# Patient Record
Sex: Male | Born: 1935 | Race: White | Hispanic: No | Marital: Married | Smoking: Former smoker
Health system: Southern US, Community
[De-identification: ages and names within clinical notes are randomized; demographics above are authoritative.]

## PROBLEM LIST (undated history)

## (undated) DIAGNOSIS — Z860100 Personal history of colon polyps, unspecified: Secondary | ICD-10-CM

## (undated) DIAGNOSIS — H9193 Unspecified hearing loss, bilateral: Secondary | ICD-10-CM

## (undated) DIAGNOSIS — F32A Depression, unspecified: Secondary | ICD-10-CM

## (undated) DIAGNOSIS — Z960 Presence of urogenital implants: Secondary | ICD-10-CM

## (undated) DIAGNOSIS — K5909 Other constipation: Secondary | ICD-10-CM

## (undated) DIAGNOSIS — K759 Inflammatory liver disease, unspecified: Secondary | ICD-10-CM

## (undated) DIAGNOSIS — I639 Cerebral infarction, unspecified: Secondary | ICD-10-CM

## (undated) DIAGNOSIS — I10 Essential (primary) hypertension: Secondary | ICD-10-CM

## (undated) DIAGNOSIS — K219 Gastro-esophageal reflux disease without esophagitis: Secondary | ICD-10-CM

## (undated) DIAGNOSIS — I609 Nontraumatic subarachnoid hemorrhage, unspecified: Secondary | ICD-10-CM

## (undated) DIAGNOSIS — R194 Change in bowel habit: Secondary | ICD-10-CM

## (undated) DIAGNOSIS — R569 Unspecified convulsions: Secondary | ICD-10-CM

## (undated) DIAGNOSIS — I350 Nonrheumatic aortic (valve) stenosis: Secondary | ICD-10-CM

## (undated) DIAGNOSIS — I509 Heart failure, unspecified: Secondary | ICD-10-CM

## (undated) DIAGNOSIS — N400 Enlarged prostate without lower urinary tract symptoms: Secondary | ICD-10-CM

## (undated) DIAGNOSIS — I2510 Atherosclerotic heart disease of native coronary artery without angina pectoris: Secondary | ICD-10-CM

## (undated) DIAGNOSIS — E782 Mixed hyperlipidemia: Secondary | ICD-10-CM

## (undated) DIAGNOSIS — E785 Hyperlipidemia, unspecified: Secondary | ICD-10-CM

## (undated) DIAGNOSIS — J449 Chronic obstructive pulmonary disease, unspecified: Secondary | ICD-10-CM

## (undated) DIAGNOSIS — F329 Major depressive disorder, single episode, unspecified: Secondary | ICD-10-CM

## (undated) DIAGNOSIS — I739 Peripheral vascular disease, unspecified: Secondary | ICD-10-CM

## (undated) DIAGNOSIS — M353 Polymyalgia rheumatica: Secondary | ICD-10-CM

## (undated) HISTORY — DX: Nontraumatic subarachnoid hemorrhage, unspecified: I60.9

## (undated) HISTORY — DX: Atherosclerotic heart disease of native coronary artery without angina pectoris: I25.10

## (undated) HISTORY — DX: Hyperlipidemia, unspecified: E78.5

## (undated) HISTORY — DX: Peripheral vascular disease, unspecified: I73.9

## (undated) HISTORY — PX: CHOLECYSTECTOMY: SHX55

## (undated) HISTORY — DX: Heart failure, unspecified: I50.9

## (undated) HISTORY — DX: Nonrheumatic aortic (valve) stenosis: I35.0

## (undated) HISTORY — DX: Polymyalgia rheumatica: M35.3

## (undated) HISTORY — DX: Personal history of colonic polyps: Z86.010

## (undated) HISTORY — DX: Mixed hyperlipidemia: E78.2

## (undated) HISTORY — DX: Essential (primary) hypertension: I10

## (undated) HISTORY — PX: CATARACT EXTRACTION, BILATERAL: SHX1313

## (undated) HISTORY — PX: TONSILLECTOMY: SUR1361

## (undated) HISTORY — DX: Other constipation: K59.09

## (undated) HISTORY — DX: Personal history of colon polyps, unspecified: Z86.0100

## (undated) HISTORY — DX: Change in bowel habit: R19.4

---

## 2006-06-23 ENCOUNTER — Ambulatory Visit: Payer: Self-pay | Admitting: Cardiology

## 2006-06-28 ENCOUNTER — Ambulatory Visit: Payer: Self-pay | Admitting: Cardiovascular Disease

## 2006-06-28 ENCOUNTER — Inpatient Hospital Stay (HOSPITAL_BASED_OUTPATIENT_CLINIC_OR_DEPARTMENT_OTHER): Admission: RE | Admit: 2006-06-28 | Discharge: 2006-06-28 | Payer: Self-pay | Admitting: Cardiovascular Disease

## 2006-09-07 DIAGNOSIS — K5909 Other constipation: Secondary | ICD-10-CM

## 2006-09-07 HISTORY — DX: Other constipation: K59.09

## 2007-07-04 HISTORY — PX: COLONOSCOPY: SHX174

## 2008-01-03 ENCOUNTER — Ambulatory Visit (HOSPITAL_COMMUNITY): Admission: RE | Admit: 2008-01-03 | Discharge: 2008-01-03 | Payer: Self-pay | Admitting: Pediatrics

## 2008-02-27 ENCOUNTER — Ambulatory Visit (HOSPITAL_COMMUNITY): Admission: RE | Admit: 2008-02-27 | Discharge: 2008-02-27 | Payer: Self-pay | Admitting: Urology

## 2008-03-06 ENCOUNTER — Ambulatory Visit: Admission: RE | Admit: 2008-03-06 | Discharge: 2008-03-06 | Payer: Self-pay | Admitting: Pediatrics

## 2008-03-12 ENCOUNTER — Ambulatory Visit (HOSPITAL_COMMUNITY): Admission: RE | Admit: 2008-03-12 | Discharge: 2008-03-12 | Payer: Self-pay | Admitting: Urology

## 2008-04-23 ENCOUNTER — Ambulatory Visit (HOSPITAL_COMMUNITY): Admission: RE | Admit: 2008-04-23 | Discharge: 2008-04-23 | Payer: Self-pay | Admitting: Urology

## 2008-05-15 ENCOUNTER — Ambulatory Visit: Payer: Self-pay | Admitting: Cardiology

## 2008-05-15 ENCOUNTER — Encounter (INDEPENDENT_AMBULATORY_CARE_PROVIDER_SITE_OTHER): Payer: Self-pay | Admitting: Pediatrics

## 2008-05-15 ENCOUNTER — Ambulatory Visit (HOSPITAL_COMMUNITY): Admission: RE | Admit: 2008-05-15 | Discharge: 2008-05-15 | Payer: Self-pay | Admitting: Pediatrics

## 2010-04-28 ENCOUNTER — Ambulatory Visit (HOSPITAL_COMMUNITY): Admission: RE | Admit: 2010-04-28 | Discharge: 2010-04-28 | Payer: Self-pay | Admitting: Family Medicine

## 2011-01-20 NOTE — Procedures (Signed)
NAME:  Jimmy Franklin, Jimmy Franklin NO.:  [id number]   MEDICAL RECORD NO.:  [id number]          PATIENT TYPE:  OUT   LOCATION:  SLEE                          FACILITY:  [facility]   PHYSICIAN:  Kofi A. Gerilyn Pilgrim, M.D. DATE OF BIRTH:  08-31-1934   DATE OF PROCEDURE:  DATE OF DISCHARGE:  03/06/2008                             SLEEP DISORDER REPORT   NOCTURNAL POLYSOMNOGRAPHY REPORT   INDICATION:  This 77 year old male who presents with snoring and  hypersomnia.  He has been evaluated for obstructive sleep apnea  syndrome.   MEDICATIONS:  1. MiraLax.  2. Lisinopril.  3. Aspirin.  4. Colace.  5. Bisoprolol.  6. Omeprazole.  7. Percocet.  8. Senna.   Epworth sleepiness scale 6.  BMI 34.   SLEEP STAGE SUMMARY:  The total recording time is 421 minutes.  Sleep  efficiency 68%.  Sleep latency 13 minutes.  REM latency 142 minutes.  Stage N1 21%, N2 44%, N3 22%, and REM sleep 13%.   RESPIRATORY SUMMARY:  Baseline oxygen saturation is 95%, the lowest  saturation is 87%, and AHI 10.   LIMB MOVEMENT SUMMARY:  PLM index is 18.   ELECTROCARDIOGRAM SUMMARY:  Average heart rate 78 with no significant  dysrhythmias observed.   IMPRESSION:  1. Mild obstructive sleep apnea syndrome.  2. Moderate periodic limb movement disorder sleep.   RECOMMENDATIONS:  Although, he did not meet the criteria for split night  study, he may benefit from a trial of auto titration unit at home to  treat his mild apnea.   Thanks for this referral.      Kofi A. Gerilyn Pilgrim, M.D.  Electronically Signed     KAD/MEDQ  D:  03/12/2008  T:  03/12/2008  Job:  [id number]

## 2011-01-20 NOTE — Op Note (Signed)
NAME:  Jimmy, Franklin NO.:  [id number]   MEDICAL RECORD NO.:  [id number]          PATIENT TYPE:  AMB   LOCATION:  DAY                           FACILITY:  [facility]   PHYSICIAN:  Dennie Maizes, M.D.   DATE OF BIRTH:  06/21/1934   DATE OF PROCEDURE:  03/12/2008  DATE OF DISCHARGE:                               OPERATIVE REPORT   PREOPERATIVE DIAGNOSES:  Hematuria, irregular left ureter, bladder  diverticulum.   POSTOPERATIVE DIAGNOSES:  Hematuria, irregular left ureter, bladder  diverticulum, and benign prostatic hypertrophy with bladder neck  obstruction.   OPERATIVE PROCEDURE:  Cystoscopy and attempted left retrograde  pyelogram.   ANESTHESIA:  General.   SURGEON:  Dennie Maizes, MD   COMPLICATIONS:  None.   ESTIMATED BLOOD LOSS:  Minimal.   DRAINS:  A 16-French Foley catheter in the bladder.   INDICATIONS FOR PROCEDURE:  This 77 year old male had intermittent mild  hematuria.  His urine cytology revealed atypical urothelial cells.  CT  scan of the abdomen and pelvis revealed no evidence of urolithiasis.  There was evidence of any solid renal mass.  The left ureter was not  seen well and found to be regular.  There was large bladder  diverticulum.  The patient was taken to the operating room today for  cystoscopy, possible bladder biopsy, and left retrograde pyelogram.   DESCRIPTION OF PROCEDURE:  General anesthesia was induced and the  patient was placed on the OR table in the dorsolithotomy position.  The  lower abdomen and genitalia were prepped and draped in a sterile  fashion.  Cystoscopy was attempted with the 25-French scope.  The  urethra was normal.  The prostate was large with bilobar enlargement  with bladder neck obstruction.  It was difficult to insert the scope  into the bladder.  The bladder neck was found to be high riding.   A 15-French flexible cystoscope was then introduced into the bladder.  Examination revealed a heavily  trabeculated bladder with a diverticulum  arising in the left lateral wall of the bladder just medial to the  ureteral orifice.  The right ureteral orifice was seen as well.  The  left ureteral orifice can clearly be seen with some difficulty as it was  located in the post-prostatic pouch.  Due to heavy trabeculation, it was  difficult to visualize the ureteral orifices.  I tried to pass a 5-  [place] whistle-tip catheter through the flexible ureteroscope and  cannulated the ureteral orifice which was unsuccessful.  Due to the  location of the ureteral orifice, it was difficult to insert the  catheter.  The left retrograde pyelogram could not be done.  The  cystoscope was then removed.  The rigid cystoscope was then inserted  into the bladder with some difficulty.  Examination of bladder was  repeated and there was evidence of any foreign body tumor in the  bladder.  Again trabeculations of the bladder as well as the bladder  diverticula were noted.  Instruments were then removed.  A 16-French  coude catheter was then inserted into the bladder.  The patient was then  transferred to the PACU in a satisfactory condition.   The left ureteral orifice was not visualized well in the CT urogram.  I  plan to repeat a IVP to visualize the collecting system on both sides.  The urine cytology will also be repeated later.      Dennie Maizes, M.D.  Electronically Signed     SK/MEDQ  D:  03/12/2008  T:  03/12/2008  Job:  [id number]   cc:   Francoise Schaumann. Milford Cage DO, FAAP  Fax: [phone]

## 2011-01-20 NOTE — H&P (Signed)
NAME:  Jimmy Franklin, Jimmy Franklin NO.:  [id number]   MEDICAL RECORD NO.:  [id number]          PATIENT TYPE:  AMB   LOCATION:  DAY                           FACILITY:  [facility]   PHYSICIAN:  Dennie Maizes, M.D.   DATE OF BIRTH:  [birth year]   DATE OF ADMISSION:  03/12/2008  DATE OF DISCHARGE:  LH                              HISTORY & PHYSICAL   CHIEF COMPLAINT:  Hematuria, bladder diverticulum.   HISTORY OF PRESENT ILLNESS:  This 77 year old male was referred to me by  Ms. Satira Anis, FNP.  He had intermittent gross hematuria for a few  days.  There is no past history of urolithiasis, urinary tract  infections or gross hematuria.  The patient denied having any voiding  difficulty.  He had urinary frequency times 3 to 4 and nocturia 1 to 2.  He has some urinary hesitancy and urgency at present.  He denied having  abdominal or flank pain.   PAST MEDICAL HISTORY:  1. COPD.  2. Hypertension.  3. GERD.  4. Elevated cholesterol.  5. Status post cholecystectomy.   MEDICATION:  1. Vitamin D 1000 international units p.o. q. daily.  2. Bayer aspirin 1 p.o. q. daily which has been discontinued for the      surgery.  3. Omeprazole 40 mg p.o. q. daily.  4. Tricor 140 mg 1 p.o. q. daily.  5. Lisinopril 20 mg 1 p.o. q. daily.  6. Simvastatin 10 mg 1 p.o. q. daily.  7. Advair 250/50 Diskus.   ALLERGIES:  None.   FAMILY HISTORY:  Positive for colon cancer, lung cancer and heart  disease.   PHYSICAL EXAMINATION:  Height 6 feet.  Weight 254 pounds.  HEAD, EYES, EARS, NOSE and THROAT:  Normal.  NECK:  No masses.  LUNGS:  Clear to auscultation.  HEART:  Regular rate and rhythm, no murmurs.  ABDOMEN:  Soft, no palpable flank mass or CVA tenderness.  Bladder not  palpable.  Penis and testes are normal.  RECTAL EXAMINATION:  40 grams and has benign prostate.   Evaluation of hematuria had been done as an outpatient.  Renal function  is normal.  BUN 18.  Creatinine 1.04.  PSA  0.52 ng/mL.  Urine cytology  revealed atypical urothelial cells.  The patient has undergone  evaluation for CT scan of abdomen and pelvis with and without contrast.  There is no evidence of any urinary calculi.  There is a small cyst in  the mid pole of right kidney.  Peripelvic cysts are noted bilaterally.  There is no evidence of any solid renal mass.  Prior cholecystectomy has  been noted.  The left ureter was not filled adequately.  This needs  further evaluation with retrograde pyelogram.  The patient had a 7 x 6  cm sized bladder diverticula posterior to the bladder.   IMPRESSION:  Hematuria, left ureteral irregularity and abnormal urine  cytology, bladder diverticulum.   PLAN:  Plan on cystoscopy, possible bladder biopsy and left retrograde  pyelogram in short-stay center.  I have discussed with the patient  regarding the diagnosis, operative details, alternate treatments,  the  outcome, possible risks and complications and he has agreed for the  procedure to be done.      Dennie Maizes, M.D.  Electronically Signed     SK/MEDQ  D:  03/12/2008  T:  03/12/2008  Job:  [id number]   cc:   Jerolyn Shin, FNP   Helm, Dr.  [facility]

## 2011-01-23 NOTE — Cardiovascular Report (Signed)
NAME:  Jimmy Franklin, Jimmy Franklin NO.:  [id number]   MEDICAL RECORD NO.:  [id number]          PATIENT TYPE:  OIB   LOCATION:  1962                         FACILITY:  [facility]   PHYSICIAN:  Veverly Fells. Excell Seltzer, MD  DATE OF BIRTH:  12/15/1933   DATE OF PROCEDURE:  06/28/2006  DATE OF DISCHARGE:                              CARDIAC CATHETERIZATION   PROCEDURES PERFORMED:  1. Right heart catheterization.  2. Left heart catheterization.  3. Selective coronary angiography.  4. Left ventricular angiography.   INDICATIONS FOR PROCEDURE:  Mr. Ra is a very pleasant 77 year old male  from [place] who presents with shortness of breath and past history of  nonobstructive coronary artery disease.  It has been several years since his  last heart catheterization.  He has multiple cardiovascular risk factors.  He has had an echocardiogram performed that showed basal septal hypertrophy  and an LV outflow tract gradient.  He was referred for right and left heart  catheterization with Brockenbrough maneuvers to evaluate him for coronary  artery disease as well as significant LV outflow obstruction.   DESCRIPTION OF PROCEDURE:  All details, risks, and indications of the  procedure were explained in detail to the patient.  Informed consent was  obtained.  The right groin was prepped, draped, and anesthetized with 1%  lidocaine under normal sterile conditions.  Using the modified Seldinger  technique, a 7-French sheath was placed in the right femoral vein and a 4-  French sheath was placed in the right femoral artery.  Coronary angiography  was performed first.  A 4-French JL4 catheter was used to image the left  coronary artery.  A 4-French JR4 catheter was used to image the right  coronary artery.  Multiple angiographic views of both vessels were  performed.  Following coronary angiography, a right heart catheterization  was performed using a Swan-Ganz catheter.  Pressures were recorded  throughout the right heart.  Oxygen saturations were drawn from the femoral  artery and the pulmonary artery.  Following the right heart catheterization,  a 4-French angled pigtail catheter was inserted into the left ventricle.  Left ventricular pressures were recorded.  A 30-degree RAO left  ventriculogram was performed.  Following left ventriculography, a pullback  within the left ventricle was performed to evaluate for any intracavitary LV  gradient.  Following this, PVCs were induced with a Swan-Ganz catheter in  the right ventricle and the left ventricular pressure was simultaneously  recorded to evaluate for the Brockenbrough phenomenon.  Following these  maneuvers, a pullback across the aortic valve was performed.  PVCs were  again induced and aortic pressures were recorded to once again evaluate for  the Brockenbrough phenomenon.   FINDINGS:  Right atrial pressure:  A wave 8, V wave 5, mean of 4.  RV  pressure:  26/1 with an end-diastolic pressure of 6.  PA pressure 20/5 with  a mean pressure of 13.  Pulmonary capillary wedge pressure:  A wave 14, V  wave 12, mean of 10.  Left ventricular pressure:  108/3 with an end-  diastolic pressure of 10.  Aortic pressure:  112/67 with a mean of 86.   Oxygen saturations 63% in the pulmonary artery.  The aorta was 89%.  Thick  cardiac output was 5.7 liters per minute.  Cardiac index 2.4 liters per  minute per m/sq.   Coronary angiography:  Left main stem has 20% mid left main stem disease  that is nonobstructive.  The left main stem bifurcates into the LAD and left  circumflex.  The LAD is a medium-caliber vessel that courses down to the  left ventricular apex.  The proximal LAD has mild calcium and is  angiographically normal.  The proximal LAD gives off a first diagonal branch  that is large diameter.  The first diagonal is angiographically normal.  The  mid LAD beyond the diagonal branch has 40% stenosis that appears  nonobstructive.   The remainder of the mid and distal LAD are  angiographically normal.  The left circumflex is a medium-caliber vessel.  It gives off a first obtuse marginal branch that is angiographically normal.  There is a second OM branch that supplies a large portion of the  inferolateral wall and is also angiographically normal.  The true left  circumflex is small diameter and courses down the AV groove.  The left  circumflex system is angiographically normal.   The right coronary artery is dominant.  Distally, it bifurcates into the PDA  and posterior AV segment.  The right coronary artery is angiographically  normal throughout its proximal mid and distal portions.  The PDA has serial  50% disease throughout its course.  The posterior AV segment and  posterolateral branch have no significant disease.   The left ventriculogram demonstrates hyperdynamic left ventricular function  with mid cavitary obliteration.  The left ventricular ejection fraction is  estimated at 75%.   Brockenbrough maneuvers were done with pressure recordings in the left  ventricle as well as the aorta and there was no significant Brockenbrough  phenomenon.   ASSESSMENT:  1. Nonobstructive coronary artery disease.  2. Hyperdynamic left ventricular function.  3. Normal right heart pressures.  4. No intracavitary left ventricular gradient.  5. Negative Brockenbrough maneuvers.   With the patient's normal hemodynamics and nonobstructive coronary artery  disease, recommend evaluation of his shortness of breath from a pulmonary  standpoint.  His arterial saturation was 89%.  Will set him up for PFTs and  a pulmonary followup in [place].      Veverly Fells. Excell Seltzer, MD  Electronically Signed     MDC/MEDQ  D:  06/28/2006  T:  06/28/2006  Job:  [id number]   cc:   Learta Codding, MD,FACC

## 2011-01-23 NOTE — Assessment & Plan Note (Signed)
[facility]                            [facility] NOTE   NAME:Rothlisberger, BRYAM TABORDA                    MRN:          [id number]  DATE:06/23/2006                            DOB:          1933-10-06    REFERRING PHYSICIAN:  Doreen Beam, M.D.   HISTORY OF PRESENT ILLNESS:  The patient is a 77 year old male with a  history of dyspnea x6 months.  The patient reports both dyspnea with rest  and activity.  He also reports increased fatigue.  An echocardiogram study  was done 2 weeks ago in Dr. Sherril Croon office, which demonstrated hyperdynamic LV  contraction with small LV outflow gradient measured at 2.5 meter per second.  There was also aortic sclerosis but no definite aortic stenosis.  The  patient has now been referred for further evaluation.  He also reports some  neck and shoulder pain on activity however, he reports pain down the left  arm.  He saw recently a neurologist for left arm pain, which was felt to be  secondary to neuropathy.  He also reports chest pressure upon exertion.  The  patient reports a lot of snoring and symptoms consistent with sleep apnea.  He denies any orthopnea, PND.  He denies any palpitations.  He does report  numbness in the left arm and as outlined above had an MRI done for further  evaluation.  In general, he reports fatigue and lack of energy.  He also has  some hearing loss.  He feels he is under a significant amount of stress.   MEDICATIONS:  1. Gabapentin 100 mg, 2 tablets q.h.s.  2. Lyrica 50 mg daily.  3. Alprazolam 0.2 daily.  4. Amlodipine 5 mg daily.   PAST MEDICAL HISTORY:  Cardiac catheterization 8 years ago, which was within  normal limits.  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  He has a son who  died of lung cancer.   FAMILY HISTORY:  Otherwise negative for coronary artery disease.  The  patient denies any allergies.  He does report a history of tobacco use and  smoked 3 packs a day but quit 41  years ago.   REVIEW OF SYSTEMS:  No fever or chills.  No headache.  Reports no blurred or  double vision.  No palpitations or syncope.  No nausea or sweating.  No  cough.  No PND, orthopnea but dyspnea at rest and on exertion.  No heartburn  or fluctuance.  No dysuria or frequency.  No joint stiffness.  No skin  lesions.  Positive for weakness and left arm numbness.  No easy bruising.  Remainder of review of systems within normal limits.   PHYSICAL EXAMINATION:  VITAL SIGNS:  Blood pressure is 148/94, heart rate  103 beats per minute.  GENERAL APPEARANCE:  Within normal limits.  HEENT:  Conjunctiva - eyelids within normal limits.  ENT - oropharynx and  nasal mucosa within normal limits.  NECK:  Supple.  There is no definite JVD.  Normal carotid upstroke.  No  carotid bruits.  RESPIRATORY:  Normal chest exam with no wheezing or  crackles.  HEART:  Normal S1 and S2.  No murmurs, rubs or gallops.  BREAST EXAM:  Not performed.  ABDOMINAL EXAM:  Essentially within normal limits with no rebound, guarding  and good bowel sounds.  LYMPHATICS:  Not performed.  GU AND RECTAL EXAM:  Not performed.  MUSCULOSKELETAL:  Gait and station within normal limits.  Range of motion is  within normal limits.  No skin lesions.  NEUROLOGICAL:  The patient is grossly intact with no focal findings.   A 12-lead electrocardiogram normal sinus rhythm with no acute ischemic  changes.   Echocardiographic study - Technically difficult with hyperdynamic LV  contractions, small outflow gradient sigmoid septum and aortic sclerosis.   PROBLEM LIST:  1. Dyspnea.  2. Chest pain.  3. Rule out hypertensive heart disease.  4. Rule out obstructive sleep apnea.  5. Left ventricular hypertrophy with sigmoid septum and small outflow      gradient.   PLAN:  1. The patient will need to be ruled out for coronary artery disease with      possible dyspnea as an __________  reports substernal chest pain.  He      does have  known risk factors.  2. If cardiac workup is within normal limits, further evaluation with PFTs      may be indicated.  The patient also appears to have symptoms consistent      with sleep apnea and apnea link monitor will be ordered.  3. Suspect the patient may have diastolic dysfunction, hypertensive heart      disease based on his electrocardiogram and abnormal blood pressure      readings.  This also would fit with the diagnosis of sleep apnea.  This      will need to be monitored in the future.  I started the patient on      atenolol 25 mg p.o. daily and aspirin 1 tablet p.o. daily.  He may need      additional medications to be added to his current antihypertensive      regimen.  4. I am not sure that the patient could not have a dynamic outflow      gradient and at the time of his catheterization, which will be a left      and right heart catheterization, will ask the operator to perform a      Brockenbrough maneuver.  5. The patient will be scheduled for left and right heart catheterization      with Brockenbrough maneuver to be done in the [facility] lab.       Learta Codding, MD,FACC     GED/MedQ  DD:  06/28/2006  DT:  06/28/2006  Job #:  [id number]   cc:   Doreen Beam

## 2011-03-21 DIAGNOSIS — R194 Change in bowel habit: Secondary | ICD-10-CM

## 2011-03-21 HISTORY — DX: Change in bowel habit: R19.4

## 2011-04-07 ENCOUNTER — Encounter (INDEPENDENT_AMBULATORY_CARE_PROVIDER_SITE_OTHER): Payer: Self-pay

## 2011-04-30 ENCOUNTER — Ambulatory Visit (INDEPENDENT_AMBULATORY_CARE_PROVIDER_SITE_OTHER): Payer: Self-pay | Admitting: Internal Medicine

## 2011-05-19 ENCOUNTER — Ambulatory Visit (INDEPENDENT_AMBULATORY_CARE_PROVIDER_SITE_OTHER): Payer: Medicare Other | Admitting: Urology

## 2011-05-19 DIAGNOSIS — R339 Retention of urine, unspecified: Secondary | ICD-10-CM

## 2011-05-19 DIAGNOSIS — N401 Enlarged prostate with lower urinary tract symptoms: Secondary | ICD-10-CM

## 2011-06-04 LAB — BASIC METABOLIC PANEL
CO2: 24
Chloride: 110
GFR calc non Af Amer: >60
Glucose, Bld: 133 — ABNORMAL HIGH
Potassium: 3.6

## 2011-06-04 LAB — CBC
HCT: 40.4
Hemoglobin: 13.4
MCHC: 33.1
MCV: 80.1

## 2011-06-22 ENCOUNTER — Encounter (HOSPITAL_COMMUNITY): Payer: Medicare Other

## 2011-06-22 ENCOUNTER — Other Ambulatory Visit: Payer: Self-pay | Admitting: Urology

## 2011-06-22 ENCOUNTER — Other Ambulatory Visit: Payer: Self-pay | Admitting: Anesthesiology

## 2011-06-22 LAB — BASIC METABOLIC PANEL
BUN: 15 mg/dL (ref 6–23)
Calcium: 9.4 mg/dL (ref 8.4–10.5)
Creatinine, Ser: 0.95 mg/dL (ref 0.50–1.35)
GFR calc Af Amer: >90 mL/min (ref >90)
GFR calc non Af Amer: 79 mL/min — ABNORMAL LOW (ref >90)
Glucose, Bld: 99 mg/dL (ref 70–99)
Potassium: 4.5 mEq/L (ref 3.5–5.1)
Sodium: 138 mEq/L (ref 135–145)

## 2011-06-22 LAB — CBC
HCT: 41.5 % (ref 39.0–52.0)
MCHC: 33.3 g/dL (ref 30.0–36.0)
MCV: 78.2 fL (ref 78.0–100.0)
Platelets: 157 10*3/uL (ref 150–400)
RDW: 15.9 % — ABNORMAL HIGH (ref 11.5–15.5)
WBC: 7.8 10*3/uL (ref 4.0–10.5)

## 2011-06-22 LAB — SURGICAL PCR SCREEN: MRSA, PCR: NEGATIVE

## 2011-06-23 ENCOUNTER — Ambulatory Visit (INDEPENDENT_AMBULATORY_CARE_PROVIDER_SITE_OTHER): Payer: Medicare Other | Admitting: Urology

## 2011-06-23 DIAGNOSIS — N401 Enlarged prostate with lower urinary tract symptoms: Secondary | ICD-10-CM

## 2011-06-23 DIAGNOSIS — R339 Retention of urine, unspecified: Secondary | ICD-10-CM

## 2011-06-24 ENCOUNTER — Ambulatory Visit (HOSPITAL_COMMUNITY)
Admission: RE | Admit: 2011-06-24 | Discharge: 2011-06-25 | Disposition: A | Discharge status: Home / Self Care | Payer: Medicare Other | Source: Ambulatory Visit | Attending: Urology | Admitting: Urology

## 2011-06-24 DIAGNOSIS — Z8673 Personal history of transient ischemic attack (TIA), and cerebral infarction without residual deficits: Secondary | ICD-10-CM | POA: Insufficient documentation

## 2011-06-24 DIAGNOSIS — R339 Retention of urine, unspecified: Secondary | ICD-10-CM | POA: Insufficient documentation

## 2011-06-24 DIAGNOSIS — I10 Essential (primary) hypertension: Secondary | ICD-10-CM | POA: Insufficient documentation

## 2011-06-24 DIAGNOSIS — N138 Other obstructive and reflux uropathy: Principal | ICD-10-CM | POA: Insufficient documentation

## 2011-06-24 DIAGNOSIS — N3289 Other specified disorders of bladder: Secondary | ICD-10-CM | POA: Insufficient documentation

## 2011-06-24 DIAGNOSIS — Z01812 Encounter for preprocedural laboratory examination: Secondary | ICD-10-CM | POA: Insufficient documentation

## 2011-06-24 DIAGNOSIS — E7800 Pure hypercholesterolemia, unspecified: Secondary | ICD-10-CM | POA: Insufficient documentation

## 2011-06-24 DIAGNOSIS — N323 Diverticulum of bladder: Secondary | ICD-10-CM | POA: Insufficient documentation

## 2011-06-24 DIAGNOSIS — K219 Gastro-esophageal reflux disease without esophagitis: Secondary | ICD-10-CM | POA: Insufficient documentation

## 2011-06-24 DIAGNOSIS — Z7902 Long term (current) use of antithrombotics/antiplatelets: Secondary | ICD-10-CM | POA: Insufficient documentation

## 2011-06-24 DIAGNOSIS — Z79899 Other long term (current) drug therapy: Secondary | ICD-10-CM | POA: Insufficient documentation

## 2011-06-24 DIAGNOSIS — N401 Enlarged prostate with lower urinary tract symptoms: Principal | ICD-10-CM | POA: Insufficient documentation

## 2011-06-30 NOTE — Op Note (Signed)
NAME:  Jimmy Franklin, HARKLESS NO.:  [id number]  MEDICAL RECORD NO.:  [id number]  LOCATION:  1427                         FACILITY:  [facility]  PHYSICIAN:  Bertram Millard. Adelei Scobey, M.D.DATE OF BIRTH:  [birth year]  DATE OF PROCEDURE:  06/24/2011 DATE OF DISCHARGE:                              OPERATIVE REPORT   PREOPERATIVE DIAGNOSIS:  Benign prostatic hypertrophy with urinary retention.  POSTOPERATIVE DIAGNOSIS:  Benign prostatic hypertrophy with urinary retention.  PRINCIPAL PROCEDURE:  Holmium laser ablation of prostate.  SURGEON:  Bertram Millard. Elynore Dolinski, MD  ANESTHESIA:  General with LMA.  COMPLICATIONS:  None.  BRIEF HISTORY:  Jimmy Franklin is a 77 year old male who we first saw in September in [place].  The patient had a history of urinary retention with BPH.  The patient had a stroke, and was hospitalized at [facility].  Because of urinary retention, he was seen by Dr. Gaynelle Arabian in the Department of Urology at [facility], and had evaluation for this including urodynamics.  This revealed the patient to have urinary obstruction, with a maximal flow rate of 6 cc/sec.  He had a relatively small capacity.  There were diverticula present within the bladder.  The patient presented to me after he failed several voiding trials.  He has had a catheter since his hospitalization at [facility], with him not being able to void with proper trials.  Because the patient is on Plavix, I have recommended, that if he had to have surgery for his BPH with retention, that we consider holmium laser rather then conventional TURP because of the significant bleeding with the TURP. The patient's neurologist state that he cannot come off his Plavix.  At this point, he is scheduled for laser TURP of the prostate.  He is aware of risks and complications of the procedure, which include but are not limited to bleeding, need for transfusion, bladder neck contracture, needing to  put the catheter back in because of inadequate voiding, infection, among others.  He understands these and desires to proceed.  DESCRIPTION OF PROCEDURE:  The patient was identified in the holding area.  Preoperative IV antibiotics were started.  He was taken to the operating room where general anesthetic was administered with LMA.  He was placed in the dorsal lithotomy position.  Genitalia and perineum were prepped and draped.  Proper time-out was then performed.  The procedure then commenced.  A 22-French panendoscope was advanced through his urethra.  His prostatic urethra was hyperemic from long-term Foley catheter placement.  His prostate was obstructed with bilobar hypertrophy.  His bladder was entered and inspected circumferentially. There were mild-to-moderate trabeculations.  Several diverticula were noted, the largest of these was in the left posterior lateral bladder. This was entered.  I probably held close to 200 cc.  I inspected it circumferentially and there were no urothelial abnormalities.  Following complete inspection of the bladder, I placed a 28-French resectoscope sheath.  I then placed the laser bridge.  A 550 side-firing single use laser fiber was placed.  The continuous irrigation/saline setup was used.  I then started the resection at the 12 o'clock position, from the bladder neck down to the distal prostatic urethra.  The right prostatic lobe was then taken down, with the direction of laser ablation being from the bladder neck to the distal prostatic urethra.  Similar resection/ablation was performed on the left side, again from the bladder neck down to the distal prostatic urethra.  The median lobe was then carefully laser coagulated, from the bladder neck to the verumontanum area.  There were 1 or 2 persistent bleeders posteriorly just to the left of the midline approximately 1 cm cephalad to the verumontanum.  I tried to fire the laser sideways in this area,  to avoid transmural damage to the rectum.  Following lengthy laser coagulation of the entire circumferential prostatic urethra, the prostatic urethra was patent, without evidence of lateral lobe encroachment.  At this point, total energy of 176.54 kilojoules had been administered.  I incised the bladder neck in the midline, and then at this point removed the scope. There was an adequate urinary stream with the bladder full and the scope removed.  I then placed a 24-French coude tip hematuria 3-way irrigation catheter.  This was hooked to CBI with saline after 40 cc of water had been placed in the balloon and gentle traction was placed.  At this point, the procedure terminated.  There were no specimens.  The patient was awakened and then taken to PACU in stable condition.     Bertram Millard. Oryn Casanova, M.D.     SMD/MEDQ  D:  06/24/2011  T:  06/24/2011  Job:  [id number]  cc:   Francoise Schaumann. Raynelle Highland Fax: [phone]  Emmie Niemann, MD Department of Neurology [facility]  Electronically Signed by Marcine Matar M.D. on 06/30/2011 06:01:07 PM

## 2011-07-14 ENCOUNTER — Ambulatory Visit (INDEPENDENT_AMBULATORY_CARE_PROVIDER_SITE_OTHER): Payer: Medicare Other | Admitting: Urology

## 2011-07-14 DIAGNOSIS — N401 Enlarged prostate with lower urinary tract symptoms: Secondary | ICD-10-CM

## 2011-07-14 DIAGNOSIS — R339 Retention of urine, unspecified: Secondary | ICD-10-CM

## 2011-07-28 ENCOUNTER — Ambulatory Visit: Payer: Medicare Other | Admitting: Urology

## 2011-08-18 ENCOUNTER — Ambulatory Visit (INDEPENDENT_AMBULATORY_CARE_PROVIDER_SITE_OTHER): Payer: Medicare Other | Admitting: Urology

## 2011-08-18 DIAGNOSIS — N138 Other obstructive and reflux uropathy: Secondary | ICD-10-CM

## 2011-08-18 DIAGNOSIS — R82998 Other abnormal findings in urine: Secondary | ICD-10-CM

## 2011-08-18 DIAGNOSIS — R339 Retention of urine, unspecified: Secondary | ICD-10-CM

## 2011-08-18 DIAGNOSIS — N401 Enlarged prostate with lower urinary tract symptoms: Secondary | ICD-10-CM

## 2011-11-27 DIAGNOSIS — I10 Essential (primary) hypertension: Secondary | ICD-10-CM | POA: Insufficient documentation

## 2011-11-27 DIAGNOSIS — Z8673 Personal history of transient ischemic attack (TIA), and cerebral infarction without residual deficits: Secondary | ICD-10-CM | POA: Insufficient documentation

## 2012-02-14 ENCOUNTER — Emergency Department (HOSPITAL_COMMUNITY): Payer: Medicare Other

## 2012-02-14 ENCOUNTER — Inpatient Hospital Stay (HOSPITAL_COMMUNITY)
Admission: EM | Admit: 2012-02-14 | Discharge: 2012-02-16 | DRG: 101 | Disposition: A | Discharge status: Home / Self Care | Payer: Medicare Other | Attending: Internal Medicine | Admitting: Internal Medicine

## 2012-02-14 ENCOUNTER — Encounter (HOSPITAL_COMMUNITY): Payer: Self-pay | Admitting: Emergency Medicine

## 2012-02-14 ENCOUNTER — Inpatient Hospital Stay (HOSPITAL_COMMUNITY): Payer: Medicare Other

## 2012-02-14 DIAGNOSIS — E782 Mixed hyperlipidemia: Secondary | ICD-10-CM

## 2012-02-14 DIAGNOSIS — G40309 Generalized idiopathic epilepsy and epileptic syndromes, not intractable, without status epilepticus: Secondary | ICD-10-CM

## 2012-02-14 DIAGNOSIS — B958 Unspecified staphylococcus as the cause of diseases classified elsewhere: Secondary | ICD-10-CM | POA: Diagnosis present

## 2012-02-14 DIAGNOSIS — K118 Other diseases of salivary glands: Secondary | ICD-10-CM | POA: Diagnosis present

## 2012-02-14 DIAGNOSIS — N390 Urinary tract infection, site not specified: Secondary | ICD-10-CM | POA: Diagnosis present

## 2012-02-14 DIAGNOSIS — R569 Unspecified convulsions: Principal | ICD-10-CM | POA: Diagnosis present

## 2012-02-14 DIAGNOSIS — N3000 Acute cystitis without hematuria: Secondary | ICD-10-CM

## 2012-02-14 DIAGNOSIS — I10 Essential (primary) hypertension: Secondary | ICD-10-CM | POA: Diagnosis present

## 2012-02-14 DIAGNOSIS — Z7902 Long term (current) use of antithrombotics/antiplatelets: Secondary | ICD-10-CM

## 2012-02-14 DIAGNOSIS — M6282 Rhabdomyolysis: Secondary | ICD-10-CM | POA: Diagnosis present

## 2012-02-14 DIAGNOSIS — R748 Abnormal levels of other serum enzymes: Secondary | ICD-10-CM

## 2012-02-14 DIAGNOSIS — Z8673 Personal history of transient ischemic attack (TIA), and cerebral infarction without residual deficits: Secondary | ICD-10-CM

## 2012-02-14 DIAGNOSIS — Z79899 Other long term (current) drug therapy: Secondary | ICD-10-CM

## 2012-02-14 DIAGNOSIS — M353 Polymyalgia rheumatica: Secondary | ICD-10-CM | POA: Diagnosis present

## 2012-02-14 DIAGNOSIS — E785 Hyperlipidemia, unspecified: Secondary | ICD-10-CM | POA: Diagnosis present

## 2012-02-14 HISTORY — DX: Cerebral infarction, unspecified: I63.9

## 2012-02-14 LAB — PROTIME-INR
INR: 0.96 (ref 0.00–1.49)
Prothrombin Time: 13 seconds (ref 11.6–15.2)

## 2012-02-14 LAB — RAPID URINE DRUG SCREEN, HOSP PERFORMED
Amphetamines: NOT DETECTED
Benzodiazepines: NOT DETECTED
Cocaine: NOT DETECTED
Opiates: NOT DETECTED
Tetrahydrocannabinol: NOT DETECTED

## 2012-02-14 LAB — COMPREHENSIVE METABOLIC PANEL
ALT: 19 U/L (ref 0–53)
Alkaline Phosphatase: 60 U/L (ref 39–117)
CO2: 17 mEq/L — ABNORMAL LOW (ref 19–32)
GFR calc Af Amer: 70 mL/min — ABNORMAL LOW (ref >90)
Glucose, Bld: 134 mg/dL — ABNORMAL HIGH (ref 70–99)
Potassium: 4.1 mEq/L (ref 3.5–5.1)
Sodium: 140 mEq/L (ref 135–145)
Total Protein: 6.4 g/dL (ref 6.0–8.3)

## 2012-02-14 LAB — URINALYSIS, ROUTINE W REFLEX MICROSCOPIC
Bilirubin Urine: NEGATIVE
Ketones, ur: NEGATIVE mg/dL
Nitrite: NEGATIVE
Urobilinogen, UA: 1 mg/dL (ref 0.0–1.0)
pH: 7 (ref 5.0–8.0)

## 2012-02-14 LAB — POCT I-STAT TROPONIN I: Troponin i, poc: 0 ng/mL (ref 0.00–0.08)

## 2012-02-14 LAB — BASIC METABOLIC PANEL
BUN: 17 mg/dL (ref 6–23)
Chloride: 110 mEq/L (ref 96–112)
Creatinine, Ser: 1.02 mg/dL (ref 0.50–1.35)
GFR calc Af Amer: 80 mL/min — ABNORMAL LOW (ref >90)
Glucose, Bld: 124 mg/dL — ABNORMAL HIGH (ref 70–99)
Potassium: 4.3 mEq/L (ref 3.5–5.1)

## 2012-02-14 LAB — CBC
HCT: 38.5 % — ABNORMAL LOW (ref 39.0–52.0)
MCH: 26.5 pg (ref 26.0–34.0)
MCV: 79.7 fL (ref 78.0–100.0)
Platelets: 127 10*3/uL — ABNORMAL LOW (ref 150–400)
Platelets: 125 10*3/uL — ABNORMAL LOW (ref 150–400)
RBC: 4.89 MIL/uL (ref 4.22–5.81)
RBC: 4.83 MIL/uL (ref 4.22–5.81)
WBC: 6.1 10*3/uL (ref 4.0–10.5)
WBC: 7.9 10*3/uL (ref 4.0–10.5)

## 2012-02-14 LAB — URINE MICROSCOPIC-ADD ON

## 2012-02-14 LAB — CREATININE, SERUM: GFR calc Af Amer: 78 mL/min — ABNORMAL LOW (ref >90)

## 2012-02-14 LAB — TROPONIN I: Troponin I: <0.3 ng/mL (ref <0.30)

## 2012-02-14 LAB — DIFFERENTIAL
Eosinophils Absolute: 0.2 10*3/uL (ref 0.0–0.7)
Lymphocytes Relative: 34 % (ref 12–46)
Lymphs Abs: 2.1 10*3/uL (ref 0.7–4.0)
Neutrophils Relative %: 52 % (ref 43–77)

## 2012-02-14 LAB — APTT: aPTT: 27 seconds (ref 24–37)

## 2012-02-14 MED ORDER — METOPROLOL SUCCINATE ER 50 MG PO TB24
50.0000 mg | ORAL_TABLET | Freq: Every day | ORAL | Status: DC
  Administered 2012-02-14 – 2012-02-16 (×3): 50 mg via ORAL
  Filled 2012-02-14 (×3): qty 1

## 2012-02-14 MED ORDER — ALBUTEROL SULFATE (5 MG/ML) 0.5% IN NEBU: 2.5000 mg | INHALATION_SOLUTION | RESPIRATORY_TRACT | Status: DC | PRN

## 2012-02-14 MED ORDER — ALPRAZOLAM 0.25 MG PO TABS: 0.2500 mg | ORAL_TABLET | Freq: Four times a day (QID) | ORAL | Status: DC | PRN

## 2012-02-14 MED ORDER — ONDANSETRON HCL 4 MG PO TABS: 4.0000 mg | ORAL_TABLET | Freq: Four times a day (QID) | ORAL | Status: DC | PRN

## 2012-02-14 MED ORDER — SENNA 8.6 MG PO TABS
1.0000 | ORAL_TABLET | Freq: Two times a day (BID) | ORAL | Status: DC
  Administered 2012-02-14 – 2012-02-16 (×4): 8.6 mg via ORAL
  Filled 2012-02-14 (×6): qty 1

## 2012-02-14 MED ORDER — SODIUM CHLORIDE 0.9 % IV SOLN
INTRAVENOUS | Status: DC
  Administered 2012-02-14 (×2): via INTRAVENOUS

## 2012-02-14 MED ORDER — CEFTRIAXONE SODIUM 1 G IJ SOLR
1.0000 g | Freq: Once | INTRAMUSCULAR | Status: AC
  Administered 2012-02-14: 1 g via INTRAVENOUS
  Filled 2012-02-14: qty 10

## 2012-02-14 MED ORDER — SODIUM CHLORIDE 0.9 % IJ SOLN: 3.0000 mL | INTRAMUSCULAR | Status: DC

## 2012-02-14 MED ORDER — ENOXAPARIN SODIUM 40 MG/0.4ML ~~LOC~~ SOLN
40.0000 mg | SUBCUTANEOUS | Status: DC
  Administered 2012-02-14 – 2012-02-15 (×2): 40 mg via SUBCUTANEOUS
  Filled 2012-02-14 (×2): qty 0.4

## 2012-02-14 MED ORDER — SODIUM CHLORIDE 0.9 % IJ SOLN
3.0000 mL | Freq: Two times a day (BID) | INTRAMUSCULAR | Status: DC
  Administered 2012-02-15 (×2): 3 mL via INTRAVENOUS

## 2012-02-14 MED ORDER — FUROSEMIDE 20 MG PO TABS
10.0000 mg | ORAL_TABLET | Freq: Two times a day (BID) | ORAL | Status: DC
  Administered 2012-02-14 – 2012-02-15 (×3): 10 mg via ORAL
  Filled 2012-02-14 (×4): qty 0.5

## 2012-02-14 MED ORDER — HYDROCODONE-ACETAMINOPHEN 5-325 MG PO TABS: 1.0000 | ORAL_TABLET | ORAL | Status: DC | PRN

## 2012-02-14 MED ORDER — SIMVASTATIN 10 MG PO TABS
10.0000 mg | ORAL_TABLET | Freq: Every day | ORAL | Status: DC
  Administered 2012-02-14 – 2012-02-15 (×2): 10 mg via ORAL
  Filled 2012-02-14 (×3): qty 1

## 2012-02-14 MED ORDER — LORAZEPAM 2 MG/ML IJ SOLN: 2.0000 mg | Freq: Four times a day (QID) | INTRAMUSCULAR | Status: DC | PRN

## 2012-02-14 MED ORDER — DEXTROSE 5 % IV SOLN
1.0000 g | INTRAVENOUS | Status: DC
  Administered 2012-02-15 – 2012-02-16 (×2): 1 g via INTRAVENOUS
  Filled 2012-02-14 (×2): qty 10

## 2012-02-14 MED ORDER — ONDANSETRON HCL 4 MG/2ML IJ SOLN: 4.0000 mg | Freq: Four times a day (QID) | INTRAMUSCULAR | Status: DC | PRN

## 2012-02-14 MED ORDER — ACETAMINOPHEN 650 MG RE SUPP: 650.0000 mg | Freq: Four times a day (QID) | RECTAL | Status: DC | PRN

## 2012-02-14 MED ORDER — ACETAMINOPHEN 325 MG PO TABS
650.0000 mg | ORAL_TABLET | Freq: Once | ORAL | Status: AC
  Administered 2012-02-14: 650 mg via ORAL
  Filled 2012-02-14: qty 2

## 2012-02-14 MED ORDER — SODIUM CHLORIDE 0.9 % IV SOLN
INTRAVENOUS | Status: AC
  Administered 2012-02-14: 11:00:00 via INTRAVENOUS

## 2012-02-14 MED ORDER — GADOBENATE DIMEGLUMINE 529 MG/ML IV SOLN
20.0000 mL | Freq: Once | INTRAVENOUS | Status: AC | PRN
  Administered 2012-02-14: 20 mL via INTRAVENOUS

## 2012-02-14 MED ORDER — LISINOPRIL 20 MG PO TABS
20.0000 mg | ORAL_TABLET | Freq: Every day | ORAL | Status: DC
  Administered 2012-02-14 – 2012-02-16 (×3): 20 mg via ORAL
  Filled 2012-02-14 (×3): qty 1

## 2012-02-14 MED ORDER — SERTRALINE HCL 100 MG PO TABS
100.0000 mg | ORAL_TABLET | Freq: Every day | ORAL | Status: DC
  Administered 2012-02-14 – 2012-02-16 (×3): 100 mg via ORAL
  Filled 2012-02-14 (×3): qty 1

## 2012-02-14 MED ORDER — CLOPIDOGREL BISULFATE 75 MG PO TABS
75.0000 mg | ORAL_TABLET | Freq: Every day | ORAL | Status: DC
  Administered 2012-02-14 – 2012-02-16 (×3): 75 mg via ORAL
  Filled 2012-02-14 (×3): qty 1

## 2012-02-14 MED ORDER — ACETAMINOPHEN 325 MG PO TABS: 650.0000 mg | ORAL_TABLET | Freq: Four times a day (QID) | ORAL | Status: DC | PRN

## 2012-02-14 NOTE — ED Notes (Signed)
Pt complains of headache 

## 2012-02-14 NOTE — Progress Notes (Signed)
02/14/12 0854  Discharge Planning  Type of Residence Private residence  Living Arrangements Spouse/significant other  Home Care Services No  Support Systems Spouse/significant other  Do you have any problems obtaining your medications? No  Family/patient expects to be discharged to: Private residence  Once you are discharged, how will you get to your follow-up appointment? Family  Expected Discharge Date 02/17/12  Case Management Consult Needed No  Social Work Consult Needed Yes (Comment)     Consult unit based LCSW if psychosocial or disposition needs are identified.    Dionne Milo MSW [facility] Emergency Dept. Weekend/Social Worker [phone]

## 2012-02-14 NOTE — H&P (Signed)
PATIENT DETAILS Name: Jimmy Franklin Age: 78 y.o. Sex: male Date of Birth: 1934-08-25 Admit Date: 02/14/2012 PCP:No primary provider on file.   CHIEF COMPLAINT:   seizure-like activity   HPI: Patient is a 78 year old white gentleman with a past medical history of CVA last year with only minimal left residual deficits, hypertension, dyslipidemia who was brought to the hospital for a witnessed seizure-like activity. Per patient he was in his usual state of health, he went to bed last night around 9 PM. From the history obtained from the chart, and also from talking to the patient's son over the telephone on 1:00 this morning patient's wife found him with generalized shakes-like seizures-EMS was then called. Following the seizures, patient was confused and agitated, he was also lethargic and took about approximately one hour to come back to his regular self. There is no history of fever. Currently the patient denies any headache. His neck is supple and he does not have any neck pain. Denies any chest pain or shortness of breath. There is no history of nausea vomiting or diarrhea. He is now being admitted to the hospital for further evaluation and treatment.  ALLERGIES:  No Known Allergies  PAST MEDICAL HISTORY: Past Medical History  Diagnosis Date  . Bowel habit changes 03/21/2011  . Hx of colonic polyps   . Chronic constipation 08  . Mixed hyperlipidemia   . Unspecified essential hypertension   . Polymyalgia rheumatica   . Stroke     PAST SURGICAL HISTORY: Past Surgical History  Procedure Date  . Colonoscopy 07/04/07    MEDICATIONS AT HOME: Prior to Admission medications   Medication Sig Start Date End Date Taking? Authorizing Provider  ALPRAZolam (XANAX) 0.25 MG tablet Take 0.25 mg by mouth every 6 (six) hours as needed. For anxiety   Yes Historical Provider, MD  clopidogrel (PLAVIX) 75 MG tablet Take 75 mg by mouth daily.   Yes Historical Provider, MD  furosemide (LASIX) 20 MG  tablet Take 10 mg by mouth 2 (two) times daily.   Yes Historical Provider, MD  ibuprofen (ADVIL,MOTRIN) 200 MG tablet Take 200 mg by mouth every 6 (six) hours as needed. For pain   Yes Historical Provider, MD  lisinopril (PRINIVIL,ZESTRIL) 20 MG tablet Take 20 mg by mouth daily.   Yes Historical Provider, MD  metoprolol succinate (TOPROL-XL) 50 MG 24 hr tablet Take 50 mg by mouth daily. Take with or immediately following a meal.   Yes Historical Provider, MD  omega-3 acid ethyl esters (LOVAZA) 1 G capsule Take 1 g by mouth 2 (two) times daily.   Yes Historical Provider, MD  ranitidine (ZANTAC) 300 MG tablet Take 300 mg by mouth at bedtime.   Yes Historical Provider, MD  sertraline (ZOLOFT) 100 MG tablet Take 100 mg by mouth daily.   Yes Historical Provider, MD  simvastatin (ZOCOR) 10 MG tablet Take 10 mg by mouth daily.   Yes Historical Provider, MD    FAMILY HISTORY: No family history on file.  SOCIAL HISTORY:  does not have a smoking history on file. He does not have any smokeless tobacco history on file. His alcohol and drug histories not on file.  REVIEW OF SYSTEMS:  Constitutional:   No  weight loss, night sweats,  Fevers, chills, fatigue.  HEENT:    No headaches, Difficulty swallowing,Tooth/dental problems,Sore throat,  No sneezing, itching, ear ache, nasal congestion, post nasal drip,   Cardio-vascular: No chest pain,  Orthopnea, PND, swelling in lower extremities, anasarca, dizziness,  palpitations  GI:  No heartburn, indigestion, abdominal pain, nausea, vomiting, diarrhea, change in       bowel habits, loss of appetite  Resp: No shortness of breath with exertion or at rest.  No excess mucus, no productive cough, No non-productive cough,  No coughing up of blood.No change in color of mucus.No wheezing.No chest wall deformity  Skin:  no rash or lesions.  GU:  no dysuria, change in color of urine, no urgency or frequency.  No flank pain.  Musculoskeletal: No joint pain or  swelling.  No decreased range of motion.  No back pain.  Psych: No change in mood or affect. No depression or anxiety.  No memory loss.   PHYSICAL EXAM: Blood pressure 126/72, pulse 73, temperature 97.9 F (36.6 C), temperature source Oral, resp. rate 20, height 6\' 1"  (1.854 m), weight 95.255 kg (210 lb), SpO2 97.00%.  General appearance :Awake, alert, not in any distress. Speech Clear. Not toxic Looking HEENT: Atraumatic and Normocephalic, pupils equally reactive to light and accomodation Neck: supple, no JVD. No cervical lymphadenopathy.  Chest:Good air entry bilaterally, no added sounds  CVS: S1 S2 regular, no murmurs.  Abdomen: Bowel sounds present, Non tender and not distended with no gaurding, rigidity or rebound. Extremities: B/L Lower Ext shows no edema, both legs are warm to touch, with  dorsalis pedis pulses palpable. Neurology: Awake alert, and oriented X 3, and minimal weakness on his left upper and left lower extremity, otherwise nonfocal. Skin:No Rash Wounds:N/A  LABS ON ADMISSION:   Basename 02/14/12 0930 02/14/12 0631 02/14/12 0210  NA -- 141 140  K -- 4.3 4.1  CL -- 110 108  CO2 -- 21 17*  GLUCOSE -- 124* 134*  BUN -- 17 17  CREATININE 1.05 1.02 --  CALCIUM -- 8.8 8.9  MG -- -- --  PHOS -- -- --    Basename 02/14/12 0210  AST 21  ALT 19  ALKPHOS 60  BILITOT 0.2*  PROT 6.4  ALBUMIN 3.2*   No results found for this basename: LIPASE:2,AMYLASE:2 in the last 72 hours  Basename 02/14/12 0930 02/14/12 0210  WBC 7.9 6.1  NEUTROABS -- 3.2  HGB 12.8* 12.9*  HCT 38.5* 39.3  MCV 79.7 80.4  PLT 125* 127*    Basename 02/14/12 0210  CKTOTAL 396*  CKMB 9.6*  CKMBINDEX --  TROPONINI <0.30   No results found for this basename: DDIMER:2 in the last 72 hours No components found with this basename: POCBNP:3   RADIOLOGIC STUDIES ON ADMISSION: Ct Head Wo Contrast  02/14/2012  *RADIOLOGY REPORT*  Clinical Data: Altered mental status  CT HEAD WITHOUT CONTRAST   Technique:  Contiguous axial images were obtained from the base of the skull through the vertex without contrast.  Comparison: 03/26/2011  Findings: Prominence of the sulci, cisterns, and ventricles, in keeping with volume loss. There are subcortical and periventricular white matter hypodensities, a nonspecific finding most often seen with chronic microangiopathic changes.  There is no evidence for acute hemorrhage, overt hydrocephalus, mass lesion, or abnormal extra-axial fluid collection.  No definite CT evidence for acute cortical based (large artery) infarction. Right occipital lobe infarction is unchanged.  Right MCA distribution infarction has developed in the interval however not favored to be acute.  Lacunar infarction along the posterior limb internal capsule on the right. The visualized paranasal sinuses and mastoid air cells are predominately clear.  IMPRESSION: Remote appearing infarctions centered within the right MCA distribution and right occipital lobe.  No definite  acute intracranial abnormality.  Original Report Authenticated By: Waneta Martins, M.D.    ASSESSMENT AND PLAN: Present on Admission:  .Seizure -First episode  -We'll admit to telemetry for now  -Get an MRI and EEG  -Patient did have a CVA last year, ?foci for seizure, with him being afebrile and with no meningeal signs and mental status being now back to baseline-doubt any infectious etiology  -We'll check EEG  -Get a urine drug screen  -Will hold off on starting any antiepileptic agents for now, would monitor his clinical course, await further studies and consult neurology if needed.   Marland KitchenUTI (lower urinary tract infection) -Start Rocephin, send urine culture and sensitivity.   Marland KitchenHTN (hypertension) -We'll continue with lisinopril, Lasix   .Dyslipidemia -Continue with statin   .Rhabdomyolysis -This is mild, we'll gently hydrate. Likely secondary to seizures.  History of CVA last year -Continue with Plavix and  statins. -Await MRI brain  Further plan will depend as patient's clinical course evolves and further radiologic and laboratory data become available. Patient will be monitored closely.  DVT Prophylaxis: Prophylactic Lovenox  Code Status: Full code  Total time spent for admission equals 45 minutes.  Jeoffrey Massed 02/14/2012, 11:40 AM

## 2012-02-14 NOTE — ED Notes (Signed)
Pt arrived via EMS with c/o possible stroke. Pt was found by EMS to be combative and moving all extremeties. EMS called code stroke. No deficits noted.

## 2012-02-14 NOTE — ED Provider Notes (Signed)
History     CSN: [id number]  Arrival date & time 02/14/12  0209   First MD Initiated Contact with Patient 02/14/12 [phone]      Chief Complaint  Patient presents with  . Code Stroke    (Consider location/radiation/quality/duration/timing/severity/associated sxs/prior treatment) HPI 78 six-year-old mouth presents from home via EMS as possible code stroke. Patient with prior history of stroke on Plavix. Upon EMS arrival, patient was combative confused moving all extremities. No deficits noted on initial evaluation. After code stroke head CT, patient back to baseline without deficits noted. Patient noted to have bite marks to his tongue. In speaking with his wife, she reports acute onset of unresponsiveness and seizure like activity. No prior history of seizures. Patient reports his last stroke was 03/21/2011. Patient was treated at [facility]. Patient reports he has had some urinary burning recently. He denies any cough chest pain shortness of breath fever chills. Patient reports dull headache at this time. He denies any neck pain. He denies any recent trauma or fall Past Medical History  Diagnosis Date  . Bowel habit changes 03/21/2011  . Hx of colonic polyps   . Chronic constipation 08  . Mixed hyperlipidemia   . Unspecified essential hypertension   . Polymyalgia rheumatica   . Stroke     Past Surgical History  Procedure Date  . Colonoscopy 07/04/07    No family history on file.  History  Substance Use Topics  . Smoking status: Not on file  . Smokeless tobacco: Not on file  . Alcohol Use:       Review of Systems  All other systems reviewed and are negative.    Allergies  Review of patient's allergies indicates no known allergies.  Home Medications   Current Outpatient Rx  Name Route Sig Dispense Refill  . ALPRAZOLAM 0.25 MG PO TABS Oral Take 0.25 mg by mouth every 6 (six) hours as needed. For anxiety    . CLOPIDOGREL BISULFATE 75 MG PO TABS Oral  Take 75 mg by mouth daily.    . FUROSEMIDE 20 MG PO TABS Oral Take 10 mg by mouth 2 (two) times daily.    . IBUPROFEN 200 MG PO TABS Oral Take 200 mg by mouth every 6 (six) hours as needed. For pain    . LISINOPRIL 20 MG PO TABS Oral Take 20 mg by mouth daily.    Marland Kitchen METOPROLOL SUCCINATE ER 50 MG PO TB24 Oral Take 50 mg by mouth daily. Take with or immediately following a meal.    . OMEGA-3-ACID ETHYL ESTERS 1 G PO CAPS Oral Take 1 g by mouth 2 (two) times daily.    Marland Kitchen RANITIDINE HCL 300 MG PO TABS Oral Take 300 mg by mouth at bedtime.    . SERTRALINE HCL 100 MG PO TABS Oral Take 100 mg by mouth daily.    Marland Kitchen SIMVASTATIN 10 MG PO TABS Oral Take 10 mg by mouth daily.      BP 132/78  Pulse 74  Temp(Src) 97.5 F (36.4 C) (Oral)  Resp 16  Ht 6\' 1"  (1.854 m)  Wt 210 lb (95.255 kg)  BMI 27.71 kg/m2  SpO2 97%  Physical Exam  Nursing note and vitals reviewed. Constitutional: He is oriented to person, place, and time. He appears well-developed and well-nourished. No distress.       Frail and elderly appearing  HENT:  Head: Normocephalic and atraumatic.  Right Ear: External ear normal.  Left Ear: External ear normal.  Mouth/Throat:  Oropharynx is clear and moist. No oropharyngeal exudate.       Superficial bite marks noted tongue  Eyes: Conjunctivae and EOM are normal. Pupils are equal, round, and reactive to light.  Neck: Normal range of motion. Neck supple. No JVD present. No tracheal deviation present. No thyromegaly present.  Cardiovascular: Normal rate, regular rhythm, normal heart sounds and intact distal pulses.  Exam reveals no gallop and no friction rub.   No murmur heard. Pulmonary/Chest: Effort normal and breath sounds normal. No stridor. No respiratory distress. He has no wheezes. He has no rales. He exhibits no tenderness.  Abdominal: Soft. Bowel sounds are normal. He exhibits no distension and no mass. There is no tenderness. There is no rebound and no guarding.  Genitourinary:  Penis normal. No penile tenderness.  Musculoskeletal: Normal range of motion. He exhibits no edema and no tenderness.  Lymphadenopathy:    He has no cervical adenopathy.  Neurological: He is alert and oriented to person, place, and time. He has normal reflexes. He displays normal reflexes. No cranial nerve deficit. He exhibits normal muscle tone. Coordination normal.  Skin: Skin is warm and dry. No rash noted. No erythema. No pallor.  Psychiatric: He has a normal mood and affect. His behavior is normal. Judgment and thought content normal.    ED Course  Procedures (including critical care time)  Labs Reviewed  CBC - Abnormal; Notable for the following:    Hemoglobin 12.9 (*)    Platelets 127 (*)    All other components within normal limits  COMPREHENSIVE METABOLIC PANEL - Abnormal; Notable for the following:    CO2 17 (*)    Glucose, Bld 134 (*)    Albumin 3.2 (*)    Total Bilirubin 0.2 (*)    GFR calc non Af Amer 61 (*)    GFR calc Af Amer 70 (*)    All other components within normal limits  CK TOTAL AND CKMB - Abnormal; Notable for the following:    Total CK 396 (*)    CK, MB 9.6 (*)    All other components within normal limits  GLUCOSE, CAPILLARY - Abnormal; Notable for the following:    Glucose-Capillary 129 (*)    All other components within normal limits  URINALYSIS, ROUTINE W REFLEX MICROSCOPIC - Abnormal; Notable for the following:    APPearance TURBID (*)    Hgb urine dipstick MODERATE (*)    Protein, ur 30 (*)    Leukocytes, UA LARGE (*)    All other components within normal limits  URINE MICROSCOPIC-ADD ON - Abnormal; Notable for the following:    Squamous Epithelial / LPF FEW (*)    Bacteria, UA MANY (*)    All other components within normal limits  PROTIME-INR  APTT  DIFFERENTIAL  TROPONIN I  POCT I-STAT TROPONIN I  URINE CULTURE  BASIC METABOLIC PANEL  LACTIC ACID, PLASMA   Ct Head Wo Contrast  02/14/2012  *RADIOLOGY REPORT*  Clinical Data: Altered  mental status  CT HEAD WITHOUT CONTRAST  Technique:  Contiguous axial images were obtained from the base of the skull through the vertex without contrast.  Comparison: 03/26/2011  Findings: Prominence of the sulci, cisterns, and ventricles, in keeping with volume loss. There are subcortical and periventricular white matter hypodensities, a nonspecific finding most often seen with chronic microangiopathic changes.  There is no evidence for acute hemorrhage, overt hydrocephalus, mass lesion, or abnormal extra-axial fluid collection.  No definite CT evidence for acute cortical based (large  artery) infarction. Right occipital lobe infarction is unchanged.  Right MCA distribution infarction has developed in the interval however not favored to be acute.  Lacunar infarction along the posterior limb internal capsule on the right. The visualized paranasal sinuses and mastoid air cells are predominately clear.  IMPRESSION: Remote appearing infarctions centered within the right MCA distribution and right occipital lobe.  No definite acute intracranial abnormality.  Original Report Authenticated By: Waneta Martins, M.D.    Date: 02/14/2012  Rate: 74  Rhythm: normal sinus rhythm  QRS Axis: normal  Intervals: normal  ST/T Wave abnormalities: normal  Conduction Disutrbances:none  Narrative Interpretation:   Old EKG Reviewed: unchanged   1. New onset seizure   2. Urinary tract infection   3. Cardiac enzymes elevated       MDM  78 year old male with new onset seizure with history of stroke. In speaking with the radiologist the location of his prior stroke in the right MCA near the temporal region could be focus of stroke. Patient also noted to have elevated CK and MB, decreased bicarbonate, and urinary tract infection. Unable to get in touch with neurology for recommendations on seizure medications. Discussed with hospitalist for admission for close monitoring        Olivia Mackie, MD 02/14/12 [phone]

## 2012-02-14 NOTE — ED Notes (Signed)
Per Dr. Norlene Campbell, the patient has had a seizure, not a stroke (i.e. code stroke is cancelled).

## 2012-02-14 NOTE — Progress Notes (Signed)
02/14/12 0857  OTHER  CSW Follow Up Status Follow-up required

## 2012-02-14 NOTE — Code Documentation (Signed)
78yo wm brought in via [facility] for possible code stroke.  Per EMS pt was LSN 0108 with witnessed episode of unresponsiveness & snorous respirations.  Pt was cont. Nonverbal at scene & snorous resp.  On arrival to [facility] pt was much more alert & able to answer questions & f/c. NIH 0. Code stroke called 0203, pt arrival 0208, EDP exam 0208, stroke team arrival 0209, LSN 0108, pt arrival in [place] 0212, phlebotomist arrival 0208, code stroke cancelled

## 2012-02-15 ENCOUNTER — Inpatient Hospital Stay (HOSPITAL_COMMUNITY): Payer: Medicare Other

## 2012-02-15 DIAGNOSIS — I10 Essential (primary) hypertension: Secondary | ICD-10-CM

## 2012-02-15 DIAGNOSIS — E782 Mixed hyperlipidemia: Secondary | ICD-10-CM

## 2012-02-15 DIAGNOSIS — N3000 Acute cystitis without hematuria: Secondary | ICD-10-CM

## 2012-02-15 DIAGNOSIS — G40309 Generalized idiopathic epilepsy and epileptic syndromes, not intractable, without status epilepticus: Secondary | ICD-10-CM

## 2012-02-15 LAB — CBC
MCH: 26.7 pg (ref 26.0–34.0)
MCHC: 33.2 g/dL (ref 30.0–36.0)
MCV: 80.4 fL (ref 78.0–100.0)
Platelets: 121 10*3/uL — ABNORMAL LOW (ref 150–400)
RBC: 4.95 MIL/uL (ref 4.22–5.81)
RDW: 15.4 % (ref 11.5–15.5)

## 2012-02-15 LAB — COMPREHENSIVE METABOLIC PANEL
ALT: 18 U/L (ref 0–53)
AST: 21 U/L (ref 0–37)
Albumin: 3.2 g/dL — ABNORMAL LOW (ref 3.5–5.2)
CO2: 21 mEq/L (ref 19–32)
Calcium: 8.8 mg/dL (ref 8.4–10.5)
Creatinine, Ser: 1.13 mg/dL (ref 0.50–1.35)
GFR calc non Af Amer: 61 mL/min — ABNORMAL LOW (ref >90)
Sodium: 139 mEq/L (ref 135–145)
Total Protein: 6.2 g/dL (ref 6.0–8.3)

## 2012-02-15 LAB — GLUCOSE, CAPILLARY

## 2012-02-15 MED ORDER — TAMSULOSIN HCL 0.4 MG PO CAPS
0.4000 mg | ORAL_CAPSULE | Freq: Every day | ORAL | Status: DC
  Administered 2012-02-15: 0.4 mg via ORAL
  Filled 2012-02-15 (×2): qty 1

## 2012-02-15 MED ORDER — FUROSEMIDE 20 MG PO TABS
10.0000 mg | ORAL_TABLET | Freq: Every day | ORAL | Status: DC
  Administered 2012-02-16: 10 mg via ORAL
  Filled 2012-02-15: qty 0.5

## 2012-02-15 MED ORDER — LEVETIRACETAM 500 MG PO TABS
500.0000 mg | ORAL_TABLET | Freq: Two times a day (BID) | ORAL | Status: DC
  Administered 2012-02-15 – 2012-02-16 (×3): 500 mg via ORAL
  Filled 2012-02-15 (×4): qty 1

## 2012-02-15 NOTE — Progress Notes (Signed)
[facility] PROGRESS NOTE  Jimmy Franklin:[id number] DOB: Jun 23, 1934 DOA: 02/14/2012 PCP: Vivia Ewing, MD, MD  Assessment/Plan: 1. Seizure  -First episode . MRI negative for acute stroke.  -Patient did have a CVA last year, and is the probable focus for seizure, with him being afebrile and with no meningeal signs and mental status being now back to baseline-doubt any infectious etiology  -We'll check EEG  -He probably needs  antiepileptic agent. -  would monitor his clinical course, await further studies and consult neurology .   2. UTI (lower urinary tract infection)  -Start Rocephin, send urine culture and sensitivity.   3. HTN (hypertension)  -We'll continue with lisinopril, Lasix   4. Dyslipidemia  -Continue with statin   5. Rhabdomyolysis  -This is mild, we'll gently hydrate. Likely secondary to seizures.   6. History of CVA last year  -Continue with Plavix and statins.       Principal Problem:  *UTI (lower urinary tract infection) Active Problems:  Seizure  HTN (hypertension)  Dyslipidemia  Rhabdomyolysis  Code Status: full Family Communication: wife Disposition Plan: home  [name], MD  [facility] Pager [phone]  If 7PM-7AM, please contact night-coverage [email or web address] Password [email or web address] 02/15/2012, 10:21 AM   LOS: 1 day   Brief narrative: Patient is a 78 year old white gentleman with a past medical history of CVA last year with only minimal left residual deficits, hypertension, dyslipidemia who was brought to the hospital for a witnessed seizure-like activity. Per patient he was in his usual state of health, he went to bed last night around 9 PM. From the history obtained from the chart, and also from talking to the patient's son over the telephone on 1:00 this morning patient's wife found him with generalized shakes-like seizures-EMS was then called. Following the seizures, patient was confused and agitated, he was also lethargic  and took about approximately one hour to come back to his regular self. There is no history of fever. Currently the patient denies any headache. His neck is supple and he does not have any neck pain. Denies any chest pain or shortness of breath. There is no history of nausea vomiting or diarrhea. He is now being admitted to the hospital for further evaluation and treatment.     Consultants:  Neuro  Procedures:  MRI brain  EEG  Antibiotics:  Rocephin 6/9  HPI/Subjective: Feels great. No further seizures  Objective: Filed Vitals:   02/14/12 1300 02/14/12 2100 02/15/12 0500 02/15/12 0924  BP: 116/69 119/61 119/69 122/69  Pulse: 71 65 61 71  Temp: 98.6 F (37 C) 97.4 F (36.3 C) 98 F (36.7 C)   TempSrc: Oral     Resp: 18 18 18    Height:      Weight:      SpO2: 95% 96% 96% 95%    Intake/Output Summary (Last 24 hours) at 02/15/12 1021 Last data filed at 02/15/12 0900  Gross per 24 hour  Intake     30 ml  Output   1450 ml  Net  -1420 ml    Exam:   General:  Alert and oriented x3  Cardiovascular: RRR, no M, R,G  Respiratory: CTAB  Abdomen: obese, soft, NT  Data Reviewed: Basic Metabolic Panel:  Lab 02/15/12 8295 02/14/12 0930 02/14/12 0631 02/14/12 0210  NA 139 -- 141 140  K 4.1 -- 4.3 4.1  CL 107 -- 110 108  CO2 21 -- 21 17*  GLUCOSE 99 -- 124* 134*  BUN 18 --  17 17  CREATININE 1.13 1.05 1.02 1.14  CALCIUM 8.8 -- 8.8 8.9  MG -- -- -- --  PHOS -- -- -- --   Liver Function Tests:  Lab 02/15/12 0450 02/14/12 0210  AST 21 21  ALT 18 19  ALKPHOS 59 60  BILITOT 0.4 0.2*  PROT 6.2 6.4  ALBUMIN 3.2* 3.2*   No results found for this basename: LIPASE:5,AMYLASE:5 in the last 168 hours No results found for this basename: AMMONIA:5 in the last 168 hours CBC:  Lab 02/15/12 0450 02/14/12 0930 02/14/12 0210  WBC 7.9 7.9 6.1  NEUTROABS -- -- 3.2  HGB 13.2 12.8* 12.9*  HCT 39.8 38.5* 39.3  MCV 80.4 79.7 80.4  PLT 121* 125* 127*   Cardiac  Enzymes:  Lab 02/14/12 0210  CKTOTAL 396*  CKMB 9.6*  CKMBINDEX --  TROPONINI <0.30   BNP (last 3 results) No results found for this basename: PROBNP:3 in the last 8760 hours CBG:  Lab 02/15/12 1009 02/14/12 0232  GLUCAP 117* 129*    No results found for this or any previous visit (from the past 240 hour(s)).   Studies: Ct Head Wo Contrast  02/14/2012  *RADIOLOGY REPORT*  Clinical Data: Altered mental status  CT HEAD WITHOUT CONTRAST  Technique:  Contiguous axial images were obtained from the base of the skull through the vertex without contrast.  Comparison: 03/26/2011  Findings: Prominence of the sulci, cisterns, and ventricles, in keeping with volume loss. There are subcortical and periventricular white matter hypodensities, a nonspecific finding most often seen with chronic microangiopathic changes.  There is no evidence for acute hemorrhage, overt hydrocephalus, mass lesion, or abnormal extra-axial fluid collection.  No definite CT evidence for acute cortical based (large artery) infarction. Right occipital lobe infarction is unchanged.  Right MCA distribution infarction has developed in the interval however not favored to be acute.  Lacunar infarction along the posterior limb internal capsule on the right. The visualized paranasal sinuses and mastoid air cells are predominately clear.  IMPRESSION: Remote appearing infarctions centered within the right MCA distribution and right occipital lobe.  No definite acute intracranial abnormality.  Original Report Authenticated By: Waneta Martins, M.D.   Mr Laqueta Jean Wo Contrast  02/14/2012  *RADIOLOGY REPORT*  Clinical Data: New onset of generalized tonic clonic seizures. Postictal confusion.  Past medical history of CVA.  Hypertension, and dyslipidemia.  MRI HEAD WITHOUT AND WITH CONTRAST  Technique:  Multiplanar, multiecho pulse sequences of the brain and surrounding structures were obtained according to standard protocol without and with  intravenous contrast  Contrast: 20mL MULTIHANCE GADOBENATE DIMEGLUMINE 529 MG/ML IV SOLN  Comparison: 02/14/2012 CT head, most recent.  Also previous CT head 03/26/2011, sometime after which, the patient's right MCA territory infarct developed.  Findings: There is no evidence for acute infarction, intracranial hemorrhage, mass lesion, hydrocephalus, or extra-axial fluid. Moderate sized remote right frontal opercular cortical and subcortical infarct and right medial and inferior occipital cortical and subcortical infarct, without foci of chronic hemorrhage. Moderately advanced gliosis associated with the chronic encephalomalacia both areas.  Moderately advanced cerebral and cerebellar atrophy. Tiny remote left cerebellar infarct.  Minimal white matter disease.  Wallerian degeneration right cerebral peduncle. No proximal carotid or basilar occlusion.  Post infusion, no abnormal enhancement of the brain or meninges. Major dural venous sinuses widely patent.  Bilateral cataract extraction.  Normal pituitary and cerebellar tonsils, but mild cervical spondylosis is suspected, incompletely evaluated.  Negative skull base and calvarium.  Chronic sinus disease.  Negative mastoids.  5 mm well circumscribed rounded lesion superficial lobe left parotid incompletely evaluated, possible lymph node or small adenoma.  Unchanged appearance compared with most recent prior CT.  IMPRESSION: Chronic infarctions as described.  No acute stroke.  No acute or chronic blood products.  Original Report Authenticated By: Elsie Stain, M.D.    Scheduled Meds:    . cefTRIAXone (ROCEPHIN)  IV  1 g Intravenous Q24H  . clopidogrel  75 mg Oral Q breakfast  . furosemide  10 mg Oral Daily  . lisinopril  20 mg Oral Daily  . metoprolol succinate  50 mg Oral Daily  . senna  1 tablet Oral BID  . sertraline  100 mg Oral Daily  . simvastatin  10 mg Oral q1800  . sodium chloride  3 mL Intravenous Q12H  . Tamsulosin HCl  0.4 mg Oral QPC supper   . DISCONTD: enoxaparin  40 mg Subcutaneous Q24H  . DISCONTD: furosemide  10 mg Oral BID   Continuous Infusions:    . sodium chloride 50 mL/hr at 02/14/12 1030

## 2012-02-15 NOTE — Progress Notes (Signed)
Per chart review, physical therapy recommended no pt follow up. Pt plans to return home with spouse and self care. CSW informed RN case manger. .No further Clinical Social Work needs, signing off.   Catha Gosselin, [place]  [phone] .02/15/2012 12:07pm

## 2012-02-15 NOTE — Progress Notes (Signed)
Utilization review completed.  

## 2012-02-15 NOTE — Progress Notes (Signed)
CSW received referral for potnetial disposition needs. CSW awaiting pt/ot evaluation to help determine pt disposition needs. .Clinical social worker continuing to follow pt to assist with pt dc plans and further csw needs.   Catha Gosselin, Theresia Majors  [phone] .02/15/2012 11:26am

## 2012-02-15 NOTE — Consult Note (Signed)
[facility] CONSULT NOTE     Reason for Consult: Seizure    HPI:    Jimmy Franklin is an 78 y.o. male with history of stroke last year. Patient had witnessed seizure early on the morning of 02/14/12. Wife who is in room who states she was awoken by her husbands deep breathing.  She could not get him to respond to any commands thus EMS was called.  When EMS arrived patient was noted to be confused and agitated and returned to baseline one hour after resolution. On arrival to ED UA showed large leukocytes and many bacteria. MRI brain W and WO contrast showed no evidence for acute infarction and old right right frontal opercular cortical and subcortical infarct and right medial and inferior occipital cortical and subcortical infarct, without foci of chronic hemorrhage. Patient is back to his baseline at this time and has no recollection of event.    Past Medical History  Diagnosis Date  . Bowel habit changes 03/21/2011  . Hx of colonic polyps   . Chronic constipation 08  . Mixed hyperlipidemia   . Unspecified essential hypertension   . Polymyalgia rheumatica   . Stroke     Past Surgical History  Procedure Date  . Colonoscopy 07/04/07    No family history on file.  Social History:  reports that he has never smoked. He does not have any smokeless tobacco history on file. He reports that he does not drink alcohol or use illicit drugs.  No Known Allergies  Medications:    Prior to Admission medications   Medication Sig Start Date End Date Taking? Authorizing Provider  ALPRAZolam (XANAX) 0.25 MG tablet Take 0.25 mg by mouth every 6 (six) hours as needed. For anxiety   Yes Historical Provider, MD  clopidogrel (PLAVIX) 75 MG tablet Take 75 mg by mouth daily.   Yes Historical Provider, MD  furosemide (LASIX) 20 MG tablet Take 10 mg by mouth 2 (two) times daily.   Yes Historical Provider, MD  ibuprofen (ADVIL,MOTRIN) 200 MG tablet Take 200 mg by mouth  every 6 (six) hours as needed. For pain   Yes Historical Provider, MD  lisinopril (PRINIVIL,ZESTRIL) 20 MG tablet Take 20 mg by mouth daily.   Yes Historical Provider, MD  metoprolol succinate (TOPROL-XL) 50 MG 24 hr tablet Take 50 mg by mouth daily. Take with or immediately following a meal.   Yes Historical Provider, MD  omega-3 acid ethyl esters (LOVAZA) 1 G capsule Take 1 g by mouth 2 (two) times daily.   Yes Historical Provider, MD  ranitidine (ZANTAC) 300 MG tablet Take 300 mg by mouth at bedtime.   Yes Historical Provider, MD  sertraline (ZOLOFT) 100 MG tablet Take 100 mg by mouth daily.   Yes Historical Provider, MD  simvastatin (ZOCOR) 10 MG tablet Take 10 mg by mouth daily.   Yes Historical Provider, MD   Scheduled:   . cefTRIAXone (ROCEPHIN)  IV  1 g Intravenous Q24H  . clopidogrel  75 mg Oral Q breakfast  . furosemide  10 mg Oral Daily  . lisinopril  20 mg Oral Daily  . metoprolol succinate  50 mg Oral Daily  . senna  1 tablet Oral BID  . sertraline  100 mg Oral Daily  . simvastatin  10 mg Oral q1800  . sodium chloride  3 mL Intravenous Q12H  .  Tamsulosin HCl  0.4 mg Oral QPC supper  . DISCONTD: enoxaparin  40 mg Subcutaneous Q24H  . DISCONTD: furosemide  10 mg Oral BID    Review of Systems - General ROS: negative for - chills, fatigue, fever or hot flashes Hematological and Lymphatic ROS: negative for - bruising, fatigue, jaundice or pallor Endocrine ROS: negative for - hair pattern changes, hot flashes, mood swings or skin changes Respiratory ROS: negative for - cough, hemoptysis, orthopnea or wheezing Cardiovascular ROS: negative for - dyspnea on exertion, orthopnea, palpitations or shortness of breath Gastrointestinal ROS: negative for - abdominal pain, appetite loss, blood in stools, diarrhea or hematemesis Musculoskeletal ROS: negative for - joint pain, joint stiffness, joint swelling or muscle pain Neurological ROS: positive for - confusion, seizures and  weakness Dermatological ROS: negative for dry skin, pruritus and rash   Blood pressure 122/69, pulse 71, temperature 98 F (36.7 C), temperature source Oral, resp. rate 18, height 6\' 1"  (1.854 m), weight 95.255 kg (210 lb), SpO2 95.00%.   Neurologic Examination:   Mental Status: Alert, oriented, thought content appropriate.  Speech fluent without evidence of aphasia. Able to follow 3 step commands without difficulty. Cranial Nerves: II-Visual fields grossly intact. III/IV/VI-Extraocular movements intact.  Pupils reactive bilaterally. V/VII-Smile symmetric VIII-grossly intact IX/X-normal gag XI-bilateral shoulder shrug XII-midline tongue extension Motor: 5/5 bilaterally with normal tone and bulk with exception of weaker left triceps extension compared to the right.  Sensory: Pinprick and light touch intact throughout, bilaterally. No difficulty with DSS Deep Tendon Reflexes: 1+  Bilateral UE and symmetric 2+ KJ, AJ depressed bilaterally with Plantars downgoing on right and equivocal on left.  Cerebellar: Normal finger-to-nose, normal rapid alternating movements and normal heel-to-shin test.      No results found for this basename: cbc, bmp, coags, chol, tri, ldl, hga1c    Results for orders placed during the hospital encounter of 02/14/12 (from the past 48 hour(s))  PROTIME-INR     Status: Normal   Collection Time   02/14/12  2:10 AM      Component Value Range Comment   Prothrombin Time 13.0  11.6 - 15.2 (seconds)    INR 0.96  0.00 - 1.49    APTT     Status: Normal   Collection Time   02/14/12  2:10 AM      Component Value Range Comment   aPTT 27  24 - 37 (seconds)   CBC     Status: Abnormal   Collection Time   02/14/12  2:10 AM      Component Value Range Comment   WBC 6.1  4.0 - 10.5 (K/uL)    RBC 4.89  4.22 - 5.81 (MIL/uL)    Hemoglobin 12.9 (*) 13.0 - 17.0 (g/dL)    HCT 42.5  95.6 - 38.7 (%)    MCV 80.4  78.0 - 100.0 (fL)    MCH 26.4  26.0 - 34.0 (pg)    MCHC 32.8  30.0  - 36.0 (g/dL)    RDW 56.4  33.2 - 95.1 (%)    Platelets 127 (*) 150 - 400 (K/uL)   DIFFERENTIAL     Status: Normal   Collection Time   02/14/12  2:10 AM      Component Value Range Comment   Neutrophils Relative 52  43 - 77 (%)    Neutro Abs 3.2  1.7 - 7.7 (K/uL)    Lymphocytes Relative 34  12 - 46 (%)    Lymphs Abs 2.1  0.7 -  4.0 (K/uL)    Monocytes Relative 11  3 - 12 (%)    Monocytes Absolute 0.7  0.1 - 1.0 (K/uL)    Eosinophils Relative 3  0 - 5 (%)    Eosinophils Absolute 0.2  0.0 - 0.7 (K/uL)    Basophils Relative 1  0 - 1 (%)    Basophils Absolute 0.0  0.0 - 0.1 (K/uL)   COMPREHENSIVE METABOLIC PANEL     Status: Abnormal   Collection Time   02/14/12  2:10 AM      Component Value Range Comment   Sodium 140  135 - 145 (mEq/L)    Potassium 4.1  3.5 - 5.1 (mEq/L)    Chloride 108  96 - 112 (mEq/L)    CO2 17 (*) 19 - 32 (mEq/L)    Glucose, Bld 134 (*) 70 - 99 (mg/dL)    BUN 17  6 - 23 (mg/dL)    Creatinine, Ser 8.29  0.50 - 1.35 (mg/dL)    Calcium 8.9  8.4 - 10.5 (mg/dL)    Total Protein 6.4  6.0 - 8.3 (g/dL)    Albumin 3.2 (*) 3.5 - 5.2 (g/dL)    AST 21  0 - 37 (U/L)    ALT 19  0 - 53 (U/L)    Alkaline Phosphatase 60  39 - 117 (U/L)    Total Bilirubin 0.2 (*) 0.3 - 1.2 (mg/dL)    GFR calc non Af Amer 61 (*) >90 (mL/min)    GFR calc Af Amer 70 (*) >90 (mL/min)   CK TOTAL AND CKMB     Status: Abnormal   Collection Time   02/14/12  2:10 AM      Component Value Range Comment   Total CK 396 (*) 7 - 232 (U/L)    CK, MB 9.6 (*) 0.3 - 4.0 (ng/mL)    Relative Index 2.4  0.0 - 2.5    TROPONIN I     Status: Normal   Collection Time   02/14/12  2:10 AM      Component Value Range Comment   Troponin I <0.30  <0.30 (ng/mL)   GLUCOSE, CAPILLARY     Status: Abnormal   Collection Time   02/14/12  2:32 AM      Component Value Range Comment   Glucose-Capillary 129 (*) 70 - 99 (mg/dL)   POCT I-STAT TROPONIN I     Status: Normal   Collection Time   02/14/12  3:10 AM      Component Value Range  Comment   Troponin i, poc 0.00  0.00 - 0.08 (ng/mL)    Comment 3            URINALYSIS, ROUTINE W REFLEX MICROSCOPIC     Status: Abnormal   Collection Time   02/14/12  4:27 AM      Component Value Range Comment   Color, Urine YELLOW  YELLOW     APPearance TURBID (*) CLEAR     Specific Gravity, Urine 1.017  1.005 - 1.030     pH 7.0  5.0 - 8.0     Glucose, UA NEGATIVE  NEGATIVE (mg/dL)    Hgb urine dipstick MODERATE (*) NEGATIVE     Bilirubin Urine NEGATIVE  NEGATIVE     Ketones, ur NEGATIVE  NEGATIVE (mg/dL)    Protein, ur 30 (*) NEGATIVE (mg/dL)    Urobilinogen, UA 1.0  0.0 - 1.0 (mg/dL)    Nitrite NEGATIVE  NEGATIVE     Leukocytes, UA  LARGE (*) NEGATIVE    URINE MICROSCOPIC-ADD ON     Status: Abnormal   Collection Time   02/14/12  4:27 AM      Component Value Range Comment   Squamous Epithelial / LPF FEW (*) RARE     WBC, UA TOO NUMEROUS TO COUNT  <3 (WBC/hpf)    RBC / HPF 3-6  <3 (RBC/hpf)    Bacteria, UA MANY (*) RARE    BASIC METABOLIC PANEL     Status: Abnormal   Collection Time   02/14/12  6:31 AM      Component Value Range Comment   Sodium 141  135 - 145 (mEq/L)    Potassium 4.3  3.5 - 5.1 (mEq/L)    Chloride 110  96 - 112 (mEq/L)    CO2 21  19 - 32 (mEq/L)    Glucose, Bld 124 (*) 70 - 99 (mg/dL)    BUN 17  6 - 23 (mg/dL)    Creatinine, Ser 4.54  0.50 - 1.35 (mg/dL)    Calcium 8.8  8.4 - 10.5 (mg/dL)    GFR calc non Af Amer 69 (*) >90 (mL/min)    GFR calc Af Amer 80 (*) >90 (mL/min)   LACTIC ACID, PLASMA     Status: Normal   Collection Time   02/14/12  6:50 AM      Component Value Range Comment   Lactic Acid, Venous 1.8  0.5 - 2.2 (mmol/L)   CBC     Status: Abnormal   Collection Time   02/14/12  9:30 AM      Component Value Range Comment   WBC 7.9  4.0 - 10.5 (K/uL)    RBC 4.83  4.22 - 5.81 (MIL/uL)    Hemoglobin 12.8 (*) 13.0 - 17.0 (g/dL)    HCT 09.8 (*) 11.9 - 52.0 (%)    MCV 79.7  78.0 - 100.0 (fL)    MCH 26.5  26.0 - 34.0 (pg)    MCHC 33.2  30.0 - 36.0 (g/dL)     RDW 14.7  82.9 - 56.2 (%)    Platelets 125 (*) 150 - 400 (K/uL)   CREATININE, SERUM     Status: Abnormal   Collection Time   02/14/12  9:30 AM      Component Value Range Comment   Creatinine, Ser 1.05  0.50 - 1.35 (mg/dL)    GFR calc non Af Amer 67 (*) >90 (mL/min)    GFR calc Af Amer 78 (*) >90 (mL/min)   URINE RAPID DRUG SCREEN (HOSP PERFORMED)     Status: Normal   Collection Time   02/14/12 10:05 AM      Component Value Range Comment   Opiates NONE DETECTED  NONE DETECTED     Cocaine NONE DETECTED  NONE DETECTED     Benzodiazepines NONE DETECTED  NONE DETECTED     Amphetamines NONE DETECTED  NONE DETECTED     Tetrahydrocannabinol NONE DETECTED  NONE DETECTED     Barbiturates NONE DETECTED  NONE DETECTED    CBC     Status: Abnormal   Collection Time   02/15/12  4:50 AM      Component Value Range Comment   WBC 7.9  4.0 - 10.5 (K/uL)    RBC 4.95  4.22 - 5.81 (MIL/uL)    Hemoglobin 13.2  13.0 - 17.0 (g/dL)    HCT 13.0  86.5 - 78.4 (%)    MCV 80.4  78.0 - 100.0 (fL)  MCH 26.7  26.0 - 34.0 (pg)    MCHC 33.2  30.0 - 36.0 (g/dL)    RDW 16.1  09.6 - 04.5 (%)    Platelets 121 (*) 150 - 400 (K/uL)   COMPREHENSIVE METABOLIC PANEL     Status: Abnormal   Collection Time   02/15/12  4:50 AM      Component Value Range Comment   Sodium 139  135 - 145 (mEq/L)    Potassium 4.1  3.5 - 5.1 (mEq/L)    Chloride 107  96 - 112 (mEq/L)    CO2 21  19 - 32 (mEq/L)    Glucose, Bld 99  70 - 99 (mg/dL)    BUN 18  6 - 23 (mg/dL)    Creatinine, Ser 4.09  0.50 - 1.35 (mg/dL)    Calcium 8.8  8.4 - 10.5 (mg/dL)    Total Protein 6.2  6.0 - 8.3 (g/dL)    Albumin 3.2 (*) 3.5 - 5.2 (g/dL)    AST 21  0 - 37 (U/L)    ALT 18  0 - 53 (U/L)    Alkaline Phosphatase 59  39 - 117 (U/L)    Total Bilirubin 0.4  0.3 - 1.2 (mg/dL)    GFR calc non Af Amer 61 (*) >90 (mL/min)    GFR calc Af Amer 71 (*) >90 (mL/min)   GLUCOSE, CAPILLARY     Status: Abnormal   Collection Time   02/15/12 10:09 AM      Component  Value Range Comment   Glucose-Capillary 117 (*) 70 - 99 (mg/dL)    Comment 1 Notify RN       Ct Head Wo Contrast  02/14/2012  *RADIOLOGY REPORT*  Clinical Data: Altered mental status  CT HEAD WITHOUT CONTRAST  Technique:  Contiguous axial images were obtained from the base of the skull through the vertex without contrast.  Comparison: 03/26/2011  Findings: Prominence of the sulci, cisterns, and ventricles, in keeping with volume loss. There are subcortical and periventricular white matter hypodensities, a nonspecific finding most often seen with chronic microangiopathic changes.  There is no evidence for acute hemorrhage, overt hydrocephalus, mass lesion, or abnormal extra-axial fluid collection.  No definite CT evidence for acute cortical based (large artery) infarction. Right occipital lobe infarction is unchanged.  Right MCA distribution infarction has developed in the interval however not favored to be acute.  Lacunar infarction along the posterior limb internal capsule on the right. The visualized paranasal sinuses and mastoid air cells are predominately clear.  IMPRESSION: Remote appearing infarctions centered within the right MCA distribution and right occipital lobe.  No definite acute intracranial abnormality.  Original Report Authenticated By: Waneta Martins, M.D.   Mr Laqueta Jean Wo Contrast  02/14/2012  *RADIOLOGY REPORT*  Clinical Data: New onset of generalized tonic clonic seizures. Postictal confusion.  Past medical history of CVA.  Hypertension, and dyslipidemia.  MRI HEAD WITHOUT AND WITH CONTRAST  Technique:  Multiplanar, multiecho pulse sequences of the brain and surrounding structures were obtained according to standard protocol without and with intravenous contrast  Contrast: 20mL MULTIHANCE GADOBENATE DIMEGLUMINE 529 MG/ML IV SOLN  Comparison: 02/14/2012 CT head, most recent.  Also previous CT head 03/26/2011, sometime after which, the patient's right MCA territory infarct developed.   Findings: There is no evidence for acute infarction, intracranial hemorrhage, mass lesion, hydrocephalus, or extra-axial fluid. Moderate sized remote right frontal opercular cortical and subcortical infarct and right medial and inferior occipital cortical and subcortical infarct, without  foci of chronic hemorrhage. Moderately advanced gliosis associated with the chronic encephalomalacia both areas.  Moderately advanced cerebral and cerebellar atrophy. Tiny remote left cerebellar infarct.  Minimal white matter disease.  Wallerian degeneration right cerebral peduncle. No proximal carotid or basilar occlusion.  Post infusion, no abnormal enhancement of the brain or meninges. Major dural venous sinuses widely patent.  Bilateral cataract extraction.  Normal pituitary and cerebellar tonsils, but mild cervical spondylosis is suspected, incompletely evaluated.  Negative skull base and calvarium.  Chronic sinus disease.  Negative mastoids.  5 mm well circumscribed rounded lesion superficial lobe left parotid incompletely evaluated, possible lymph node or small adenoma.  Unchanged appearance compared with most recent prior CT.  IMPRESSION: Chronic infarctions as described.  No acute stroke.  No acute or chronic blood products.  Original Report Authenticated By: Elsie Stain, M.D.     Assessment/Plan:   78 YO male presenting with new onset seizure in setting UTI and history of strokes in the past.  Imaging shows multiple chronic infarcts.  Although this is the patient's first seizure he is at high risk for recurrence.  Would start antiepileptics.  EEG pending at this time.   Recommend: 1) Start Keppra 500 mg PO BID. 2) Continue treatment of UTI as per primary team 3) Further evaluate 5 mm well circumscribed rounded lesion superficial lobe left parotid incompletely evaluated, possible lymph node or small adenoma.  May be performed as an outpatient if necessary.   Felicie Morn PA-C [facility] [phone]  02/15/2012, 1:24 PM    Patient seen and examined. I agree with the above.  Thana Farr, MD [facility] [phone]  02/15/2012  5:41 PM

## 2012-02-15 NOTE — Progress Notes (Signed)
Routine EEG completed.  

## 2012-02-15 NOTE — Evaluation (Signed)
Physical Therapy Evaluation Patient Details Name: Jimmy Franklin MRN: [id number] DOB: [birth year] Today's Date: 02/15/2012 Time: 3244-0102 PT Time Calculation (min): 19 min  PT Assessment / Plan / Recommendation Clinical Impression  Patient presented to ED s/p seizure. He is at baseline for his mobility and has no PT needs.    PT Assessment  Patent does not need any further PT services    Follow Up Recommendations  No PT follow up    Barriers to Discharge  None      lEquipment Recommendations  None recommended by PT    Recommendations for Other Services  None   Frequency  N/A    Precautions / Restrictions Precautions Precautions: None   Pertinent Vitals/Pain VSS/no pain      Mobility  Bed Mobility Bed Mobility: Supine to Sit;Sitting - Scoot to [place] of Bed;Rolling Left;Left Sidelying to Sit Rolling Left: 7: Independent Left Sidelying to Sit: 7: Independent Supine to Sit: 7: Independent Sitting - Scoot to Edge of Bed: 7: Independent Transfers Transfers: Sit to Stand;Stand to Sit Sit to Stand: With upper extremity assist;Without upper extremity assist;From bed;7: Independent Stand to Sit: 7: Independent;With upper extremity assist;Without upper extremity assist;To bed Ambulation/Gait Ambulation/Gait Assistance: 7: Independent Ambulation Distance (Feet): 300 Feet Assistive device: None Gait Pattern: Within Functional Limits Stairs: Yes Stairs Assistance: 6: Modified independent (Device/Increase time) Stair Management Technique: One rail Right;Step to pattern;Forwards     Visit Information  Last PT Received On: 02/15/12 Assistance Needed: +1    Subjective Data  Subjective: Patient reports that he feels at baseline for his mobility. He states that he has become less efficent since his stroke. 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 difficulties    Cognition  Overall Cognitive Status: Appears within functional limits for tasks assessed/performed Arousal/Alertness: Awake/alert Orientation Level: Appears intact for tasks assessed Behavior During Session: [facility] for tasks performed    Extremity/Trunk Assessment Right Lower Extremity Assessment RLE ROM/Strength/Tone: Within functional levels RLE Sensation: WFL - Light Touch;WFL - Proprioception RLE Coordination: WFL - gross/fine motor Left Lower Extremity Assessment LLE ROM/Strength/Tone: Within functional levels LLE Sensation: WFL - Light Touch;WFL - Proprioception LLE Coordination: WFL - gross/fine motor Trunk Assessment Trunk Assessment: Normal   Balance High Level Balance High Level Balance Activites: Side stepping;Turns;Sudden stops;Head turns;Direction changes High Level Balance Comments: No evidence of imbalance  End of Session PT - End of Session Equipment Utilized During Treatment: Gait belt Activity Tolerance: Patient tolerated treatment well Patient left: in chair;with call bell/phone within reach;with family/visitor present Nurse Communication: Mobility status   Edwyna Perfect, PT  Pager [phone]  02/15/2012, 11:44 AM

## 2012-02-15 NOTE — Progress Notes (Signed)
Nutrition Brief Note  RD pulled to patient due to Nutrition Risk Report: problems chewing or swallowing foods and/or liquids. Patient reports he does not have any chewing or swallowing issues at this time; does get "choked up sometimes;" states he has a good appetite. PO intake 100% today at lunch (Heart Healthy diet). No nutrition intervention warranted at this time. Please consult RD as needed.  Jimmy Franklin Pager #: [phone]

## 2012-02-16 DIAGNOSIS — N3000 Acute cystitis without hematuria: Secondary | ICD-10-CM

## 2012-02-16 DIAGNOSIS — G40309 Generalized idiopathic epilepsy and epileptic syndromes, not intractable, without status epilepticus: Secondary | ICD-10-CM

## 2012-02-16 DIAGNOSIS — Z8673 Personal history of transient ischemic attack (TIA), and cerebral infarction without residual deficits: Secondary | ICD-10-CM

## 2012-02-16 DIAGNOSIS — E782 Mixed hyperlipidemia: Secondary | ICD-10-CM

## 2012-02-16 DIAGNOSIS — K118 Other diseases of salivary glands: Secondary | ICD-10-CM | POA: Diagnosis present

## 2012-02-16 DIAGNOSIS — I10 Essential (primary) hypertension: Secondary | ICD-10-CM

## 2012-02-16 LAB — GLUCOSE, CAPILLARY: Glucose-Capillary: 111 mg/dL — ABNORMAL HIGH (ref 70–99)

## 2012-02-16 LAB — URINE CULTURE

## 2012-02-16 MED ORDER — LEVETIRACETAM 500 MG PO TABS: 500.0000 mg | ORAL_TABLET | Freq: Two times a day (BID) | ORAL | Status: DC

## 2012-02-16 MED ORDER — TAMSULOSIN HCL 0.4 MG PO CAPS: 0.4000 mg | ORAL_CAPSULE | Freq: Every day | ORAL | Status: DC

## 2012-02-16 MED ORDER — SULFAMETHOXAZOLE-TRIMETHOPRIM 800-160 MG PO TABS: 1.0000 | ORAL_TABLET | Freq: Two times a day (BID) | ORAL | Status: AC

## 2012-02-16 NOTE — Procedures (Signed)
EEG NUMBER:  REFERRING PHYSICIAN:  Jeoffrey Massed, MD  HISTORY:  A 78 year old male admitted with new onset seizures.  MEDICATIONS:  Rocephin, Plavix, Lasix, lisinopril, Toprol, Senokot, Zoloft, Zocor, Flomax, Lovenox.  CONDITIONS OF RECORDING:  This is a 16 channel EEG carried out with the patient in the awake state.  DESCRIPTION:  The waking background activity consists of a low-voltage, symmetrical, fairly well-organized 10 Hz alpha activity seen from the parieto-occipital and posterotemporal regions.  Low-voltage, fast activity, poorly organized was seen anteriorly and at times, superimposed on more posterior rhythms.  A mixture of theta and alpha rhythm was seen from the central and temporal regions.  The patient does not drowse or sleep.  Hyperventilation was performed and elicited a mild built up, but failed to elicit any abnormalities.  Intermittent photic stimulation was attempted, but failed to elicit any change in the tracing.  IMPRESSION:  This is a normal awake EEG.  COMMENT: An EEG with the patient sleep deprived to elicit drowse and light sleep maybe desirable to further elicit a possible seizure disorder.          ______________________________ Thana Farr, MD    UV:[id number] D:  02/16/2012 09:14:49  T:  02/16/2012 11:36:03  Job #:  [id number]

## 2012-02-16 NOTE — Progress Notes (Signed)
[facility] PROGRESS NOTE    SUBJECTIVE   Patient has had no further seizures.  He has no adverse effects to Keppra after receiving two doses.   OBJECTIVE   Vital signs in last 24 hours: Temp:  [97.7 F (36.5 C)-98.4 F (36.9 C)] 97.7 F (36.5 C) (06/11 0500) Pulse Rate:  [60-93] 93  (06/11 0500) Resp:  [16-20] 20  (06/11 0500) BP: (97-156)/(52-79) 103/52 mmHg (06/11 0500) SpO2:  [94 %-96 %] 96 % (06/11 0500)  Intake/Output from previous day: 06/10 0701 - 06/11 0700 In: 240 [P.O.:240] Out: 100 [Urine:100] Intake/Output this shift:   Nutritional status: Cardiac  Past Medical History  Diagnosis Date  . Bowel habit changes 03/21/2011  . Hx of colonic polyps   . Chronic constipation 08  . Mixed hyperlipidemia   . Unspecified essential hypertension   . Polymyalgia rheumatica   . Stroke     Neurologic Exam:   Mental Status:  Alert, oriented, thought content appropriate. Speech fluent without evidence of aphasia. Able to follow 3 step commands without difficulty.  Cranial Nerves:  II-Visual fields grossly intact.  III/IV/VI-Extraocular movements intact. Pupils reactive bilaterally.  V/VII-Smile symmetric  VIII-grossly intact  IX/X-normal gag  XI-bilateral shoulder shrug  XII-midline tongue extension  Motor: 5/5 bilaterally with normal tone and bulk with exception of weaker left triceps extension compared to the right.  Sensory: Pinprick and light touch intact throughout, bilaterally. No difficulty with DSS  Deep Tendon Reflexes: 1+ Bilateral UE and symmetric 2+ KJ, AJ depressed bilaterally with Plantars downgoing on right and equivocal on left.  Cerebellar: Normal finger-to-nose, normal rapid alternating movements and normal heel-to-shin test.   Lab Results: No results found for this basename: cbc, bmp, coags, chol, tri, ldl, hga1c   Lipid Panel No results found for this basename:  CHOL,TRIG,HDL,CHOLHDL,VLDL,LDLCALC in the last 72 hours  Studies/Results: Mr Laqueta Jean Wo Contrast  02/14/2012  *RADIOLOGY REPORT*  Clinical Data: New onset of generalized tonic clonic seizures. Postictal confusion.  Past medical history of CVA.  Hypertension, and dyslipidemia.  MRI HEAD WITHOUT AND WITH CONTRAST  Technique:  Multiplanar, multiecho pulse sequences of the brain and surrounding structures were obtained according to standard protocol without and with intravenous contrast  Contrast: 20mL MULTIHANCE GADOBENATE DIMEGLUMINE 529 MG/ML IV SOLN  Comparison: 02/14/2012 CT head, most recent.  Also previous CT head 03/26/2011, sometime after which, the patient's right MCA territory infarct developed.  Findings: There is no evidence for acute infarction, intracranial hemorrhage, mass lesion, hydrocephalus, or extra-axial fluid. Moderate sized remote right frontal opercular cortical and subcortical infarct and right medial and inferior occipital cortical and subcortical infarct, without foci of chronic hemorrhage. Moderately advanced gliosis associated with the chronic encephalomalacia both areas.  Moderately advanced cerebral and cerebellar atrophy. Tiny remote left cerebellar infarct.  Minimal white matter disease.  Wallerian degeneration right cerebral peduncle. No proximal carotid or basilar occlusion.  Post infusion, no abnormal enhancement of the brain or meninges. Major dural venous sinuses widely patent.  Bilateral cataract extraction.  Normal pituitary and cerebellar tonsils, but mild cervical spondylosis is suspected, incompletely evaluated.  Negative skull base and calvarium.  Chronic sinus disease.  Negative mastoids.  5 mm well circumscribed rounded lesion superficial lobe left parotid incompletely evaluated, possible lymph node or small adenoma.  Unchanged appearance compared with most recent prior CT.  IMPRESSION: Chronic infarctions as described.  No acute stroke.  No acute or chronic blood  products.  Original Report Authenticated By: Elsie Stain, M.D.    Medications:     Scheduled:   . cefTRIAXone (ROCEPHIN)  IV  1 g Intravenous Q24H  . clopidogrel  75 mg Oral Q breakfast  . furosemide  10 mg Oral Daily  . levETIRAcetam  500 mg Oral BID  . lisinopril  20 mg Oral Daily  . metoprolol succinate  50 mg Oral Daily  . senna  1 tablet Oral BID  . sertraline  100 mg Oral Daily  . simvastatin  10 mg Oral q1800  . sodium chloride  3 mL Intravenous Q12H  . Tamsulosin HCl  0.4 mg Oral QPC supper  . DISCONTD: enoxaparin  40 mg Subcutaneous Q24H  . DISCONTD: furosemide  10 mg Oral BID    Assessment/Plan:   78 YO male presenting with new onset seizure in setting UTI and history of strokes in the past.  Started on Keppra 500 mg BID  Recommend:  1) Continue Keppra 500 mg PO BID. Have follow up with neurologist or PCP. EEG pending--no need to keep patient in hospital for EEG reading as patient is stable without seizures at this time.  2) Continue treatment of UTI as per primary team  3) Further evaluate 5 mm well circumscribed rounded lesion superficial lobe left parotid incompletely evaluated, possible lymph node or small adenoma. May be performed as an outpatient if necessary.  Neurology will S/O    Felicie Morn PA-C [facility] [phone]  02/16/2012, 9:17 AM

## 2012-02-16 NOTE — Discharge Summary (Signed)
Physician Discharge Summary  Jimmy Franklin ZOX:[id number] DOB: [birth year] DOA: 02/14/2012  PCP: Vivia Ewing, MD, MD  Admit date: 02/14/2012 Discharge date: 02/16/2012  Recommendations for Outpatient Follow-up:  1. Outpatient follow up with ENT for 5 mm well circumscribed rounded lesion superficial lobe left parotid - discovered incidentally during MRI brain.   Discharge Diagnoses:  Principal Problem:  *Seizure Active Problems:  UTI (lower urinary tract infection)  HTN (hypertension)  Dyslipidemia  Rhabdomyolysis  History of stroke  Parotid nodule   Discharge Condition: Good  Diet recommendation: Heart healthy  History of present illness:  Patient is a 78 year old white gentleman with a past medical history of CVA last year with only minimal left residual deficits, hypertension, dyslipidemia who was brought to the hospital for a witnessed seizure-like activity. Per patient he was in his usual state of health, he went to bed last night around 9 PM. From the history obtained from the chart, and also from talking to the patient's son over the telephone on 1:00 this morning patient's wife found him with generalized shakes-like seizures-EMS was then called. Following the seizures, patient was confused and agitated, he was also lethargic and took about approximately one hour to come back to his regular self. There is no history of fever. Currently the patient denies any headache. His neck is supple and he does not have any neck pain. Denies any chest pain or shortness of breath. There is no history of nausea vomiting or diarrhea. He is now being admitted to the hospital for further evaluation and treatment.   Hospital Course:  1. Seizure. This is the primary reason for admitting the patient. The history is convincing for seizures. Patient did have tongue bite. He was evaluated with an MRI of the brain which did not reveal a new stroke, but he did show old strokes in the MCA territory with  gliosis. Patient was observed for 48 hours without recurrence of seizures. He had an EEG done. The patient was seen in consultation by Dr. Thana Farr from neurology and she recommended antiepileptic treatment with Keppra 500 mg twice a day. Patient can be monitored by his primary care physician or by his primary neurologist at [facility].  #2 urinary tract infection - culture is showing Staphylococcus species. Patient is nontoxic, able to urinate. He was treated with intravenous Rocephin in the hospital. He will be switched to oral amoxicillin at the time of the discharge 3. mild rhabdomyolysis from seizure this resolved with IV fluids 4. incidental finding of a parotid nodule - the patient was instructed to followup with ENT in [place].   Procedures:  EEG  Consultations:  Dr. Thana Farr - Neurology   Discharge Exam: Filed Vitals:   02/16/12 0500  BP: 103/52  Pulse: 93  Temp: 97.7 F (36.5 C)  Resp: 20   Filed Vitals:   02/15/12 1830 02/15/12 2135 02/15/12 2300 02/16/12 0500  BP: 113/64 97/59 100/60 103/52  Pulse:  60  93  Temp:  98.4 F (36.9 C)  97.7 F (36.5 C)  TempSrc:  Oral  Oral  Resp:  16  20  Height:      Weight:      SpO2:  94%  96%   General: alert and oriented x3 Cardiovascular: RRR, no M, R,G Respiratory: CTAB  Discharge Instructions  Discharge Orders    Future Orders Please Complete By Expires   Diet - low sodium heart healthy      Increase activity slowly  Medication List  As of 02/16/2012  9:28 AM   TAKE these medications         ALPRAZolam 0.25 MG tablet   Commonly known as: XANAX   Take 0.25 mg by mouth every 6 (six) hours as needed. For anxiety      clopidogrel 75 MG tablet   Commonly known as: PLAVIX   Take 75 mg by mouth daily.      furosemide 20 MG tablet   Commonly known as: LASIX   Take 10 mg by mouth 2 (two) times daily.      ibuprofen 200 MG tablet   Commonly known as:  ADVIL,MOTRIN   Take 200 mg by mouth every 6 (six) hours as needed. For pain      levETIRAcetam 500 MG tablet   Commonly known as: KEPPRA   Take 1 tablet (500 mg total) by mouth 2 (two) times daily.      lisinopril 20 MG tablet   Commonly known as: PRINIVIL,ZESTRIL   Take 20 mg by mouth daily.      metoprolol succinate 50 MG 24 hr tablet   Commonly known as: TOPROL-XL   Take 50 mg by mouth daily. Take with or immediately following a meal.      omega-3 acid ethyl esters 1 G capsule   Commonly known as: LOVAZA   Take 1 g by mouth 2 (two) times daily.      ranitidine 300 MG tablet   Commonly known as: ZANTAC   Take 300 mg by mouth at bedtime.      sertraline 100 MG tablet   Commonly known as: ZOLOFT   Take 100 mg by mouth daily.      simvastatin 10 MG tablet   Commonly known as: ZOCOR   Take 10 mg by mouth daily.      sulfamethoxazole-trimethoprim 800-160 MG per tablet   Commonly known as: BACTRIM DS,SEPTRA DS   Take 1 tablet by mouth 2 (two) times daily.      Tamsulosin HCl 0.4 MG Caps   Commonly known as: FLOMAX   Take 1 capsule (0.4 mg total) by mouth daily after supper.           Follow-up Information    Schedule an appointment as soon as possible for a visit with Vivia Ewing, MD.   Contact information:   [address] [place] [address] [phone]       Follow up with [facility]. (call make appointment for parotid nodule)    Contact information:   [phone]          The results of significant diagnostics from this hospitalization (including imaging, microbiology, ancillary and laboratory) are listed below for reference.    Significant Diagnostic Studies: Ct Head Wo Contrast  02/14/2012  *RADIOLOGY REPORT*  Clinical Data: Altered mental status  CT HEAD WITHOUT CONTRAST  Technique:  Contiguous axial images were obtained from the base of the skull through the vertex without contrast.  Comparison: 03/26/2011  Findings:  Prominence of the sulci, cisterns, and ventricles, in keeping with volume loss. There are subcortical and periventricular white matter hypodensities, a nonspecific finding most often seen with chronic microangiopathic changes.  There is no evidence for acute hemorrhage, overt hydrocephalus, mass lesion, or abnormal extra-axial fluid collection.  No definite CT evidence for acute cortical based (large artery) infarction. Right occipital lobe infarction is unchanged.  Right MCA distribution infarction has developed in the interval however not favored to be acute.  Lacunar infarction along the posterior limb internal capsule on the right. The visualized paranasal sinuses and mastoid air cells are predominately clear.  IMPRESSION: Remote appearing infarctions centered within the right MCA distribution and right occipital lobe.  No definite acute intracranial abnormality.  Original Report Authenticated By: Waneta Martins, M.D.   Mr Jimmy Franklin Wo Contrast  02/14/2012  *RADIOLOGY REPORT*  Clinical Data: New onset of generalized tonic clonic seizures. Postictal confusion.  Past medical history of CVA.  Hypertension, and dyslipidemia.  MRI HEAD WITHOUT AND WITH CONTRAST  Technique:  Multiplanar, multiecho pulse sequences of the brain and surrounding structures were obtained according to standard protocol without and with intravenous contrast  Contrast: 20mL MULTIHANCE GADOBENATE DIMEGLUMINE 529 MG/ML IV SOLN  Comparison: 02/14/2012 CT head, most recent.  Also previous CT head 03/26/2011, sometime after which, the patient's right MCA territory infarct developed.  Findings: There is no evidence for acute infarction, intracranial hemorrhage, mass lesion, hydrocephalus, or extra-axial fluid. Moderate sized remote right frontal opercular cortical and subcortical infarct and right medial and inferior occipital cortical and subcortical infarct, without foci of chronic hemorrhage. Moderately advanced gliosis associated with the  chronic encephalomalacia both areas.  Moderately advanced cerebral and cerebellar atrophy. Tiny remote left cerebellar infarct.  Minimal white matter disease.  Wallerian degeneration right cerebral peduncle. No proximal carotid or basilar occlusion.  Post infusion, no abnormal enhancement of the brain or meninges. Major dural venous sinuses widely patent.  Bilateral cataract extraction.  Normal pituitary and cerebellar tonsils, but mild cervical spondylosis is suspected, incompletely evaluated.  Negative skull base and calvarium.  Chronic sinus disease.  Negative mastoids.  5 mm well circumscribed rounded lesion superficial lobe left parotid incompletely evaluated, possible lymph node or small adenoma.  Unchanged appearance compared with most recent prior CT.  IMPRESSION: Chronic infarctions as described.  No acute stroke.  No acute or chronic blood products.  Original Report Authenticated By: Elsie Stain, M.D.    Microbiology: Recent Results (from the past 240 hour(s))  URINE CULTURE     Status: Normal (Preliminary result)   Collection Time   02/14/12  6:01 AM      Component Value Range Status Comment   Specimen Description URINE, CLEAN CATCH   Final    Special Requests NONE   Final    Culture  Setup Time [id number]   Final    Colony Count >=100,000 COLONIES/ML   Final    Culture STAPHYLOCOCCUS SPECIES   Final    Report Status PENDING   Incomplete      Labs: Basic Metabolic Panel:  Lab 02/15/12 8119 02/14/12 0930 02/14/12 0631 02/14/12 0210  NA 139 -- 141 140  K 4.1 -- 4.3 4.1  CL 107 -- 110 108  CO2 21 -- 21 17*  GLUCOSE 99 -- 124* 134*  BUN 18 -- 17 17  CREATININE 1.13 1.05 1.02 1.14  CALCIUM 8.8 -- 8.8 8.9  MG -- -- -- --  PHOS -- -- -- --   Liver Function Tests:  Lab 02/15/12 0450 02/14/12 0210  AST 21 21  ALT 18 19  ALKPHOS 59 60  BILITOT 0.4 0.2*  PROT 6.2 6.4  ALBUMIN 3.2* 3.2*   No results found for this basename: LIPASE:5,AMYLASE:5 in the last 168 hours No  results found for this basename: AMMONIA:5 in the last 168 hours CBC:  Lab 02/15/12 0450 02/14/12 0930 02/14/12 0210  WBC 7.9 7.9 6.1  NEUTROABS -- -- 3.2  HGB 13.2 12.8* 12.9*  HCT 39.8 38.5* 39.3  MCV 80.4 79.7 80.4  PLT 121* 125* 127*   Cardiac Enzymes:  Lab 02/14/12 0210  CKTOTAL 396*  CKMB 9.6*  CKMBINDEX --  TROPONINI <0.30   BNP: BNP (last 3 results) No results found for this basename: PROBNP:3 in the last 8760 hours CBG:  Lab 02/16/12 0732 02/15/12 1009 02/14/12 0232  GLUCAP 111* 117* 129*    Time coordinating discharge: 45 minutes  Signed:  Lonia Blood, MD  [facility] 02/16/2012, 9:28 AM

## 2012-02-16 NOTE — Progress Notes (Signed)
Order received to d/c pt.  D/c instructions and medications reviewed with pt.  Pt verbalizes understanding.  Pt shows no s/s of distress.  Pt d/c'ed to home. 

## 2012-02-23 ENCOUNTER — Ambulatory Visit (INDEPENDENT_AMBULATORY_CARE_PROVIDER_SITE_OTHER): Payer: Medicare Other | Admitting: Urology

## 2012-02-23 DIAGNOSIS — R339 Retention of urine, unspecified: Secondary | ICD-10-CM

## 2012-02-23 DIAGNOSIS — R82998 Other abnormal findings in urine: Secondary | ICD-10-CM

## 2012-02-23 DIAGNOSIS — N401 Enlarged prostate with lower urinary tract symptoms: Secondary | ICD-10-CM

## 2012-03-03 ENCOUNTER — Ambulatory Visit (INDEPENDENT_AMBULATORY_CARE_PROVIDER_SITE_OTHER): Payer: Medicare Other | Admitting: Internal Medicine

## 2013-02-14 ENCOUNTER — Ambulatory Visit (INDEPENDENT_AMBULATORY_CARE_PROVIDER_SITE_OTHER): Payer: Medicare Other | Admitting: Urology

## 2013-02-14 DIAGNOSIS — N401 Enlarged prostate with lower urinary tract symptoms: Secondary | ICD-10-CM

## 2013-02-14 DIAGNOSIS — R82998 Other abnormal findings in urine: Secondary | ICD-10-CM

## 2013-02-14 DIAGNOSIS — R339 Retention of urine, unspecified: Secondary | ICD-10-CM

## 2013-02-28 ENCOUNTER — Other Ambulatory Visit: Payer: Self-pay | Admitting: Urology

## 2013-02-28 ENCOUNTER — Ambulatory Visit (INDEPENDENT_AMBULATORY_CARE_PROVIDER_SITE_OTHER): Payer: Medicare Other | Admitting: Urology

## 2013-02-28 DIAGNOSIS — N320 Bladder-neck obstruction: Secondary | ICD-10-CM

## 2013-02-28 DIAGNOSIS — R82998 Other abnormal findings in urine: Secondary | ICD-10-CM

## 2013-02-28 DIAGNOSIS — R339 Retention of urine, unspecified: Secondary | ICD-10-CM

## 2013-03-02 ENCOUNTER — Ambulatory Visit (HOSPITAL_COMMUNITY)
Admission: RE | Admit: 2013-03-02 | Discharge: 2013-03-02 | Disposition: A | Discharge status: Home / Self Care | Payer: Medicare Other | Source: Ambulatory Visit | Attending: Urology | Admitting: Urology

## 2013-03-02 DIAGNOSIS — N320 Bladder-neck obstruction: Secondary | ICD-10-CM

## 2013-03-02 DIAGNOSIS — N35919 Unspecified urethral stricture, male, unspecified site: Secondary | ICD-10-CM | POA: Insufficient documentation

## 2013-03-02 DIAGNOSIS — R350 Frequency of micturition: Secondary | ICD-10-CM | POA: Insufficient documentation

## 2013-03-02 MED ORDER — IOHEXOL 300 MG/ML  SOLN
50.0000 mL | Freq: Once | INTRAMUSCULAR | Status: AC | PRN
  Administered 2013-03-02: 50 mL via URETHRAL

## 2013-03-06 ENCOUNTER — Other Ambulatory Visit: Payer: Self-pay | Admitting: Urology

## 2013-03-16 ENCOUNTER — Encounter (HOSPITAL_COMMUNITY): Payer: Self-pay | Admitting: Pharmacy Technician

## 2013-03-17 ENCOUNTER — Encounter (HOSPITAL_COMMUNITY)
Admission: RE | Admit: 2013-03-17 | Discharge: 2013-03-17 | Disposition: A | Discharge status: Home / Self Care | Payer: Medicare Other | Source: Ambulatory Visit | Attending: Urology | Admitting: Urology

## 2013-03-17 ENCOUNTER — Encounter (HOSPITAL_COMMUNITY): Payer: Self-pay

## 2013-03-17 ENCOUNTER — Ambulatory Visit (HOSPITAL_COMMUNITY)
Admission: RE | Admit: 2013-03-17 | Discharge: 2013-03-17 | Disposition: A | Discharge status: Home / Self Care | Payer: Medicare Other | Source: Ambulatory Visit | Attending: Urology | Admitting: Urology

## 2013-03-17 DIAGNOSIS — Z978 Presence of other specified devices: Secondary | ICD-10-CM

## 2013-03-17 DIAGNOSIS — M47814 Spondylosis without myelopathy or radiculopathy, thoracic region: Secondary | ICD-10-CM | POA: Insufficient documentation

## 2013-03-17 DIAGNOSIS — N320 Bladder-neck obstruction: Secondary | ICD-10-CM | POA: Insufficient documentation

## 2013-03-17 DIAGNOSIS — Z01810 Encounter for preprocedural cardiovascular examination: Secondary | ICD-10-CM | POA: Insufficient documentation

## 2013-03-17 DIAGNOSIS — N138 Other obstructive and reflux uropathy: Secondary | ICD-10-CM | POA: Insufficient documentation

## 2013-03-17 DIAGNOSIS — N401 Enlarged prostate with lower urinary tract symptoms: Secondary | ICD-10-CM | POA: Insufficient documentation

## 2013-03-17 HISTORY — DX: Unspecified convulsions: R56.9

## 2013-03-17 HISTORY — DX: Presence of urogenital implants: Z96.0

## 2013-03-17 HISTORY — DX: Presence of other specified devices: Z97.8

## 2013-03-17 HISTORY — DX: Unspecified hearing loss, bilateral: H91.93

## 2013-03-17 HISTORY — DX: Gastro-esophageal reflux disease without esophagitis: K21.9

## 2013-03-17 HISTORY — DX: Major depressive disorder, single episode, unspecified: F32.9

## 2013-03-17 HISTORY — DX: Depression, unspecified: F32.A

## 2013-03-17 HISTORY — DX: Chronic obstructive pulmonary disease, unspecified: J44.9

## 2013-03-17 LAB — CBC
HCT: 44.3 % (ref 39.0–52.0)
Hemoglobin: 14.9 g/dL (ref 13.0–17.0)
MCH: 28.4 pg (ref 26.0–34.0)
MCHC: 33.6 g/dL (ref 30.0–36.0)
MCV: 84.4 fL (ref 78.0–100.0)

## 2013-03-17 LAB — BASIC METABOLIC PANEL
BUN: 23 mg/dL (ref 6–23)
Calcium: 9.1 mg/dL (ref 8.4–10.5)
Creatinine, Ser: 1.21 mg/dL (ref 0.50–1.35)
GFR calc non Af Amer: 56 mL/min — ABNORMAL LOW (ref >90)
Glucose, Bld: 94 mg/dL (ref 70–99)

## 2013-03-17 NOTE — Patient Instructions (Addendum)
20 DRAE MITZEL  03/17/2013   Your procedure is scheduled on:  7-17 -2014  Report to [facility] at     1030   AM .  Call this number if you have problems the morning of surgery: [phone]  Or Presurgical Testing [phone](Tyra Gural)   Remember: Follow any bowel prep instructions per MD office.    Do not eat food:After Midnight.  May have clear liquids:up to 6 Hours before arrival. Nothing after : 0700 AM  Clear liquids include soda, tea, black coffee, apple or grape juice, broth.  Take these medicines the morning of surgery with A SIP OF WATER: Levofloxacin. METOPROLOL. EFFEXOR. Simvastatin. Aplrazolam. Tamsulosin. Stop Plavix as instructed- no ibuprofen or aleve, Tylenol is okay.   Do not wear jewelry, make-up or nail polish.  Do not wear lotions, powders, or perfumes. You may wear deodorant.  Do not shave 12 hours prior to first CHG shower(legs and under arms).(face and neck okay.)  Do not bring valuables to the hospital.  Contacts, dentures or bridgework,body piercing,  may not be worn into surgery.  Leave suitcase in the car. After surgery it may be brought to your room.  For patients admitted to the hospital, checkout time is 11:00 AM the day of discharge.   Patients discharged the day of surgery will not be allowed to drive home. Must have responsible person with you x 24 hours once discharged.  Name and phone number of your driver: Dio Giller- [phone] h  Special Instructions: CHG(Chlorhedine 4%-"Hibiclens","Betasept","Aplicare") Shower Use Special Wash: see special instructions.(avoid face and genitals)      Failure to follow these instructions may result in Cancellation of your surgery.   Patient signature_______________________________________________________

## 2013-03-17 NOTE — Pre-Procedure Instructions (Signed)
03-17-13 EKG/ CXR done today.

## 2013-03-22 MED ORDER — GENTAMICIN SULFATE 40 MG/ML IJ SOLN
440.0000 mg | INTRAVENOUS | Status: AC
  Administered 2013-03-23: 440 mg via INTRAVENOUS
  Filled 2013-03-22 (×2): qty 11

## 2013-03-23 ENCOUNTER — Encounter (HOSPITAL_COMMUNITY): Payer: Self-pay | Admitting: Anesthesiology

## 2013-03-23 ENCOUNTER — Encounter (HOSPITAL_COMMUNITY): Payer: Self-pay | Admitting: *Deleted

## 2013-03-23 ENCOUNTER — Observation Stay (HOSPITAL_COMMUNITY)
Admission: RE | Admit: 2013-03-23 | Discharge: 2013-03-24 | Disposition: A | Discharge status: Home / Self Care | Payer: Medicare Other | Source: Ambulatory Visit | Attending: Urology | Admitting: Urology

## 2013-03-23 ENCOUNTER — Encounter (HOSPITAL_COMMUNITY)
Admission: RE | Disposition: A | Discharge status: Home / Self Care | Payer: Self-pay | Source: Ambulatory Visit | Attending: Urology

## 2013-03-23 ENCOUNTER — Ambulatory Visit (HOSPITAL_COMMUNITY): Payer: Medicare Other | Admitting: Anesthesiology

## 2013-03-23 DIAGNOSIS — R29898 Other symptoms and signs involving the musculoskeletal system: Secondary | ICD-10-CM | POA: Insufficient documentation

## 2013-03-23 DIAGNOSIS — J449 Chronic obstructive pulmonary disease, unspecified: Secondary | ICD-10-CM | POA: Insufficient documentation

## 2013-03-23 DIAGNOSIS — N401 Enlarged prostate with lower urinary tract symptoms: Secondary | ICD-10-CM | POA: Insufficient documentation

## 2013-03-23 DIAGNOSIS — N410 Acute prostatitis: Principal | ICD-10-CM | POA: Insufficient documentation

## 2013-03-23 DIAGNOSIS — J4489 Other specified chronic obstructive pulmonary disease: Secondary | ICD-10-CM | POA: Insufficient documentation

## 2013-03-23 DIAGNOSIS — N138 Other obstructive and reflux uropathy: Secondary | ICD-10-CM | POA: Insufficient documentation

## 2013-03-23 DIAGNOSIS — E782 Mixed hyperlipidemia: Secondary | ICD-10-CM | POA: Insufficient documentation

## 2013-03-23 DIAGNOSIS — M353 Polymyalgia rheumatica: Secondary | ICD-10-CM | POA: Insufficient documentation

## 2013-03-23 DIAGNOSIS — N139 Obstructive and reflux uropathy, unspecified: Secondary | ICD-10-CM | POA: Insufficient documentation

## 2013-03-23 DIAGNOSIS — I10 Essential (primary) hypertension: Secondary | ICD-10-CM | POA: Insufficient documentation

## 2013-03-23 DIAGNOSIS — N4289 Other specified disorders of prostate: Secondary | ICD-10-CM | POA: Insufficient documentation

## 2013-03-23 DIAGNOSIS — K219 Gastro-esophageal reflux disease without esophagitis: Secondary | ICD-10-CM | POA: Insufficient documentation

## 2013-03-23 DIAGNOSIS — I69998 Other sequelae following unspecified cerebrovascular disease: Secondary | ICD-10-CM | POA: Insufficient documentation

## 2013-03-23 HISTORY — PX: TRANSURETHRAL RESECTION OF PROSTATE: SHX73

## 2013-03-23 SURGERY — TURP (TRANSURETHRAL RESECTION OF PROSTATE)
Anesthesia: General | Wound class: Clean Contaminated

## 2013-03-23 MED ORDER — FAMOTIDINE 10 MG PO TABS
10.0000 mg | ORAL_TABLET | Freq: Every day | ORAL | Status: DC
  Administered 2013-03-24: 10 mg via ORAL
  Filled 2013-03-23: qty 1

## 2013-03-23 MED ORDER — BELLADONNA ALKALOIDS-OPIUM 16.2-60 MG RE SUPP
1.0000 | Freq: Four times a day (QID) | RECTAL | Status: DC | PRN
  Administered 2013-03-23: 1 via RECTAL

## 2013-03-23 MED ORDER — ALPRAZOLAM 0.25 MG PO TABS: 0.2500 mg | ORAL_TABLET | Freq: Two times a day (BID) | ORAL | Status: DC | PRN

## 2013-03-23 MED ORDER — FENTANYL CITRATE 0.05 MG/ML IJ SOLN
INTRAMUSCULAR | Status: AC
  Filled 2013-03-23: qty 2

## 2013-03-23 MED ORDER — PROMETHAZINE HCL 25 MG/ML IJ SOLN: 6.2500 mg | INTRAMUSCULAR | Status: DC | PRN

## 2013-03-23 MED ORDER — LACTATED RINGERS IV SOLN
INTRAVENOUS | Status: DC
  Administered 2013-03-23: 1000 mL via INTRAVENOUS

## 2013-03-23 MED ORDER — BIOTENE DRY MOUTH MT LIQD: 15.0000 mL | Freq: Two times a day (BID) | OROMUCOSAL | Status: DC

## 2013-03-23 MED ORDER — BELLADONNA ALKALOIDS-OPIUM 16.2-60 MG RE SUPP
RECTAL | Status: AC
  Filled 2013-03-23: qty 1

## 2013-03-23 MED ORDER — FUROSEMIDE 20 MG PO TABS
20.0000 mg | ORAL_TABLET | Freq: Every day | ORAL | Status: DC
Start: 2013-03-24 — End: 2013-03-24
  Administered 2013-03-24: 20 mg via ORAL
  Filled 2013-03-23: qty 1

## 2013-03-23 MED ORDER — DEXTROSE-NACL 5-0.45 % IV SOLN
INTRAVENOUS | Status: DC
  Administered 2013-03-23 – 2013-03-24 (×2): via INTRAVENOUS

## 2013-03-23 MED ORDER — LISINOPRIL 20 MG PO TABS
20.0000 mg | ORAL_TABLET | Freq: Every morning | ORAL | Status: DC
  Administered 2013-03-24: 20 mg via ORAL
  Filled 2013-03-23: qty 1

## 2013-03-23 MED ORDER — FENTANYL CITRATE 0.05 MG/ML IJ SOLN
25.0000 ug | INTRAMUSCULAR | Status: DC | PRN
  Administered 2013-03-23 (×3): 50 ug via INTRAVENOUS

## 2013-03-23 MED ORDER — DOCUSATE SODIUM 100 MG PO CAPS
100.0000 mg | ORAL_CAPSULE | Freq: Two times a day (BID) | ORAL | Status: DC
  Administered 2013-03-23 – 2013-03-24 (×2): 100 mg via ORAL
  Filled 2013-03-23 (×4): qty 1

## 2013-03-23 MED ORDER — METOPROLOL SUCCINATE ER 50 MG PO TB24
50.0000 mg | ORAL_TABLET | Freq: Every morning | ORAL | Status: DC
  Administered 2013-03-24: 50 mg via ORAL
  Filled 2013-03-23: qty 1

## 2013-03-23 MED ORDER — FENTANYL CITRATE 0.05 MG/ML IJ SOLN
INTRAMUSCULAR | Status: DC | PRN
  Administered 2013-03-23: 50 ug via INTRAVENOUS
  Administered 2013-03-23 (×2): 25 ug via INTRAVENOUS

## 2013-03-23 MED ORDER — VENLAFAXINE HCL ER 150 MG PO CP24
150.0000 mg | ORAL_CAPSULE | Freq: Every morning | ORAL | Status: DC
  Administered 2013-03-24: 150 mg via ORAL
  Filled 2013-03-23: qty 1

## 2013-03-23 MED ORDER — DEXAMETHASONE SODIUM PHOSPHATE 10 MG/ML IJ SOLN
INTRAMUSCULAR | Status: DC | PRN
  Administered 2013-03-23: 10 mg via INTRAVENOUS

## 2013-03-23 MED ORDER — PROPOFOL 10 MG/ML IV BOLUS
INTRAVENOUS | Status: DC | PRN
  Administered 2013-03-23: 200 mg via INTRAVENOUS

## 2013-03-23 MED ORDER — LACTATED RINGERS IV SOLN: INTRAVENOUS | Status: DC

## 2013-03-23 MED ORDER — HYDROCODONE-ACETAMINOPHEN 5-325 MG PO TABS
1.0000 | ORAL_TABLET | ORAL | Status: DC | PRN
  Administered 2013-03-23: 2 via ORAL
  Filled 2013-03-23: qty 2

## 2013-03-23 MED ORDER — SODIUM CHLORIDE 0.9 % IR SOLN
3000.0000 mL | Status: DC
  Administered 2013-03-23: 3000 mL

## 2013-03-23 MED ORDER — SODIUM CHLORIDE 0.9 % IR SOLN
Status: DC | PRN
  Administered 2013-03-23: 9000 mL via INTRAVESICAL

## 2013-03-23 MED ORDER — LIDOCAINE HCL (CARDIAC) 20 MG/ML IV SOLN
INTRAVENOUS | Status: DC | PRN
  Administered 2013-03-23: 100 mg via INTRAVENOUS

## 2013-03-23 MED ORDER — CIPROFLOXACIN HCL 500 MG PO TABS
500.0000 mg | ORAL_TABLET | Freq: Two times a day (BID) | ORAL | Status: AC
  Administered 2013-03-23: 500 mg via ORAL
  Filled 2013-03-23 (×2): qty 1

## 2013-03-23 MED ORDER — ONDANSETRON HCL 4 MG/2ML IJ SOLN
INTRAMUSCULAR | Status: DC | PRN
  Administered 2013-03-23: 4 mg via INTRAVENOUS

## 2013-03-23 MED ORDER — MIDAZOLAM HCL 5 MG/5ML IJ SOLN
INTRAMUSCULAR | Status: DC | PRN
  Administered 2013-03-23: 2 mg via INTRAVENOUS

## 2013-03-23 MED ORDER — IOHEXOL 300 MG/ML  SOLN
INTRAMUSCULAR | Status: AC
  Filled 2013-03-23: qty 1

## 2013-03-23 MED ORDER — ONDANSETRON HCL 4 MG/2ML IJ SOLN: 4.0000 mg | INTRAMUSCULAR | Status: DC | PRN

## 2013-03-23 MED ORDER — SIMVASTATIN 10 MG PO TABS
10.0000 mg | ORAL_TABLET | Freq: Every morning | ORAL | Status: DC
  Administered 2013-03-24: 10 mg via ORAL
  Filled 2013-03-23: qty 1

## 2013-03-23 SURGICAL SUPPLY — 29 items
ADAPTER CATH URET PLST 4-6FR (CATHETERS) IMPLANT
BAG URINE DRAINAGE (UROLOGICAL SUPPLIES) ×2 IMPLANT
BAG URO CATCHER STRL LF (DRAPE) ×2 IMPLANT
BLADE SURG 15 STRL LF DISP TIS (BLADE) IMPLANT
BLADE SURG 15 STRL SS (BLADE)
CATH FOLEY 3WAY 30CC 22FR (CATHETERS) ×2 IMPLANT
CATH URET 5FR 28IN CONE TIP (BALLOONS)
CATH URET 5FR 70CM CONE TIP (BALLOONS) IMPLANT
CATH URET WHISTLE 5FR 28IN (CATHETERS) IMPLANT
CATH URET WHISTLE 6FR (CATHETERS) IMPLANT
CLOTH BEACON ORANGE TIMEOUT ST (SAFETY) ×2 IMPLANT
DRAPE CAMERA CLOSED 9X96 (DRAPES) ×2 IMPLANT
ELECT LOOP MED HF 24F 12D CBL (CLIP) ×2 IMPLANT
ELECT REM PT RETURN 9FT ADLT (ELECTROSURGICAL) ×2
ELECTRODE REM PT RTRN 9FT ADLT (ELECTROSURGICAL) ×1 IMPLANT
EVACUATOR MICROVAS BLADDER (UROLOGICAL SUPPLIES) ×2 IMPLANT
GLOVE BIOGEL M 8.0 STRL (GLOVE) ×2 IMPLANT
GOWN PREVENTION PLUS XLARGE (GOWN DISPOSABLE) ×2 IMPLANT
GOWN STRL REIN XL XLG (GOWN DISPOSABLE) ×2 IMPLANT
HOLDER FOLEY CATH W/STRAP (MISCELLANEOUS) IMPLANT
KIT ASPIRATION TUBING (SET/KITS/TRAYS/PACK) ×2 IMPLANT
LOOPS RESECTOSCOPE DISP (ELECTROSURGICAL) IMPLANT
MANIFOLD NEPTUNE II (INSTRUMENTS) ×2 IMPLANT
PACK CYSTO (CUSTOM PROCEDURE TRAY) ×2 IMPLANT
SUT ETHILON 3 0 PS 1 (SUTURE) IMPLANT
SYR 30ML LL (SYRINGE) IMPLANT
SYRINGE IRR TOOMEY STRL 70CC (SYRINGE) ×2 IMPLANT
TUBING CONNECTING 10 (TUBING) ×2 IMPLANT
WIRE COONS/BENSON .038X145CM (WIRE) IMPLANT

## 2013-03-23 NOTE — Transfer of Care (Signed)
Immediate Anesthesia Transfer of Care Note  Patient: Jimmy Franklin  Procedure(s) Performed: Procedure(s): CYSTOSCOPY WITH URETHRAL DILATATION possible BALLOON DIALATION (N/A) TRANSURETHRAL RESECTION OF THE PROSTATE (TURP) (N/A)  Patient Location: PACU  Anesthesia Type:General  Level of Consciousness: sedated  Airway & Oxygen Therapy: Patient Spontanous Breathing and Patient connected to face mask oxygen  Post-op Assessment: Report given to PACU RN and Post -op Vital signs reviewed and stable  Post vital signs: Reviewed and stable  Complications: No apparent anesthesia complications

## 2013-03-23 NOTE — H&P (Signed)
Urology History and Physical Exam  CC: Bladder outlet obstruction  HPI: 79 year old male presents for TUR-P. 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 He recently was found to have high pvr urine volumes. He had a catheter placed 03/08/2013 by Dr. Laverle Patter following unsuccessful cystoscopy by me shortly before that. He was also found to have a bladder stone.His history is as follows:    He suffered a stroke in the summer of 2012, and was treated at [facility]. A catheter was placed, as he could not void adequately. He was followed up by Dr. Karleen Hampshire from his urologic standpoint. A catheter was changed several times. He was given several voiding trials, he failed these. He underwent complex urodynamics and fluoroscopic voiding study. This revealed an obstructive pattern, with a maximum flow rate of 6 cc a second. The patient's capacity was only 209 cc. Maximum pressure was 93 cm of water. At maximum flow, pressure was 44 cm of water. He did not leak. EMG activity was normal. He had no reflux. Diverticula were noted.  The procedure was performed on 06/24/2011. He was discharged the following day, and voided adequately  PMH: Past Medical History  Diagnosis Date  . Bowel habit changes 03/21/2011  . Hx of colonic polyps   . Chronic constipation 08  . Mixed hyperlipidemia   . Unspecified essential hypertension   . Polymyalgia rheumatica   . Stroke     CVA- 3 yrs ago-left sided weakness, mild residual  . GERD (gastroesophageal reflux disease)   . Foley catheter in place 03-17-13  . COPD (chronic obstructive pulmonary disease)   . Seizures     caused by UTI-none recent- over 1 yr  . Hearing loss of both ears   . Depression     PSH: Past Surgical History  Procedure Laterality Date  . Colonoscopy  07/04/07  . Cataract extraction, bilateral    . Tonsillectomy    . Cholecystectomy      lap. gallbladder removal     Allergies: No Known Allergies  Medications: No prescriptions prior to admission     Social History: History   Social History  . Marital Status: Married    Spouse Name: N/A    Number of Children: N/A  . Years of Education: N/A   Occupational History  . Not on file.   Social History Main Topics  . Smoking status: Former Smoker    Quit date: 03/18/1967  . Smokeless tobacco: Not on file  . Alcohol Use: No  . Drug Use: No  . Sexually Active: Not Currently   Other Topics Concern  . Not on file   Social History Narrative  . No narrative on file    Family History: No family history on file.  Review of Systems: Positive: cloudy urine(treated). Slow urine stream. Negative:   A further 10 point review of systems was negative except what is listed in the HPI.  Physical Exam: @VITALS2 @ General: No acute distress.  Awake. Head:  Normocephalic.  Atraumatic. ENT:  EOMI.  Mucous membranes moist Neck:  Supple.  No lymphadenopathy. CV:  S1 present. S2 present. Regular rate. Pulmonary: Equal effort bilaterally.  Clear to auscultation bilaterally. Abdomen: Soft.  Non tender to palpation. Obese. Skin:  Normal turgor.  No visible rash. Extremity: No gross deformity of bilateral upper extremities.  No gross deformity of    bilateral lower extremities. Neurologic: Alert. Appropriate mood.    Studies:  No results found for this basename: HGB, WBC, PLT,  in the last 72 hours  No results found for this basename: NA, K, CL, CO2, BUN, CREATININE, CALCIUM, MAGNESIUM, GFRNONAA, GFRAA,  in the last 72 hours   No results found for this basename: PT, INR, APTT,  in the last 72 hours   No components found with this basename: ABG,     Assessment:  BOO/BPH  Plan: TUR-P

## 2013-03-23 NOTE — Anesthesia Preprocedure Evaluation (Signed)
Anesthesia Evaluation  Patient identified by MRN, date of birth, ID band Patient awake    Reviewed: Allergy & Precautions, H&P , NPO status , Patient's Chart, lab work & pertinent test results  Airway Mallampati: II TM Distance: >3 FB Neck ROM: Full    Dental  (+) Teeth Intact, Poor Dentition, Missing, Chipped and Dental Advisory Given,    Pulmonary neg pulmonary ROS, COPDformer smoker,  breath sounds clear to auscultation  Pulmonary exam normal       Cardiovascular hypertension, negative cardio ROS  Rhythm:Regular Rate:Normal     Neuro/Psych Seizures -,  Depression  Neuromuscular disease CVA, Residual Symptoms negative neurological ROS  negative psych ROS   GI/Hepatic negative GI ROS, Neg liver ROS, GERD-  ,  Endo/Other  negative endocrine ROS  Renal/GU negative Renal ROS  negative genitourinary   Musculoskeletal negative musculoskeletal ROS (+)   Abdominal   Peds  Hematology negative hematology ROS (+)   Anesthesia Other Findings   Reproductive/Obstetrics                           Anesthesia Physical Anesthesia Plan  ASA: III  Anesthesia Plan: General   Post-op Pain Management:    Induction: Intravenous  Airway Management Planned: Oral ETT  Additional Equipment:   Intra-op Plan:   Post-operative Plan: Extubation in OR  Informed Consent: I have reviewed the patients History and Physical, chart, labs and discussed the procedure including the risks, benefits and alternatives for the proposed anesthesia with the patient or authorized representative who has indicated his/her understanding and acceptance.   Dental advisory given  Plan Discussed with: CRNA  Anesthesia Plan Comments:         Anesthesia Quick Evaluation

## 2013-03-23 NOTE — Anesthesia Postprocedure Evaluation (Signed)
Anesthesia Post Note  Patient: Jimmy Franklin  Procedure(s) Performed: Procedure(s) (LRB): TRANSURETHRAL RESECTION OF THE PROSTATE (TURP) (N/A)  Anesthesia type: General  Patient location: PACU  Post pain: Pain level controlled  Post assessment: Post-op Vital signs reviewed  Last Vitals:  Filed Vitals:   03/23/13 1500  BP: 132/85  Pulse: 83  Temp:   Resp: 10    Post vital signs: Reviewed  Level of consciousness: sedated  Complications: No apparent anesthesia complications

## 2013-03-23 NOTE — Op Note (Signed)
Preoperative diagnosis:BPH with obstruction  Postoperative diagnosis:Same   Procedure:Gyrus TURP    Surgeon: Bertram Millard. Keyari Kleeman, M.D.   Anesthesia: Gen.   Complications:None  Specimen(s):prostate chips  Drain(s):22 fr 3 way foley  Indications: 79 year old male, 2 years out from a laser TURP. He has recurrent obstruction and retention. He currently has a catheter. Cystoscopically he does have bladder neck obstruction. He presents now for repeat TURP using the gyrus device. He has been off the anticoagulants for 5 days, cleared by his primary care provider. Risks and complications of the procedure have been discussed with the patient. He understands these and desires to proceed.    Technique and findings: The patient was properly identified in the holding area. He received preoperative IV stent gentamicin. He was taken the operating room where general anesthetic was administered. He was placed in the dorsolithotomy position. Genitalia and perineum were prepped and draped. Proper timeout was then performed.  I then passed a 24 French resectoscope sheath using the visual obturator. There was an obvious false passage posteriorly. I was easily able to navigate anteriorly into the prostate that showed recurrent growth and obstruction. There was a nodular regrowth of the bladder neck on the left it was obviously causing some obstruction as well. The bladder was entered and inspected circumferentially. There were no urothelial lesions. There were diverticula bilaterally, the largest of which is on the left and was approximately 2-3 cm at least in size. Ureteral orifices were carefully identified. I then resected the recurrent obstructive tissue using the gyrus loop. Tissue was resected circumferentially. Tissue was resected from the bladder neck down to the verumontanum. The verumontanum was a bit abnormal, with a false passage located just proximal to this posteriorly. Following aggressive resection  of all obstructive tissue, I then trimmed the apical tissue, and cauterized small bleeders circumferentially. Careful inspection was given to the resected fossa, no further bleeding was seen. Chips were irrigated from the bladder. I then reinspected the bladder, no prostate chips were left in there was no significant bleeding. I removed the resectoscope, placed a 22 French three-way Foley catheter using the catheter guide. The balloon was filled with 30 cc of water and this was hooked to CBI with normal saline.  The patient tolerated procedure well. He was awakened and taken to the PACU in stable condition.

## 2013-03-24 ENCOUNTER — Encounter (HOSPITAL_COMMUNITY): Payer: Self-pay | Admitting: Urology

## 2013-03-24 MED ORDER — DOXYCYCLINE HYCLATE 50 MG PO CAPS: 50.0000 mg | ORAL_CAPSULE | Freq: Two times a day (BID) | ORAL | Status: DC

## 2013-03-24 NOTE — Progress Notes (Signed)
Pt voided x3.  Urine remains bloody but lighter and clear.  Notified MD office.  Encouraged pt to continue to drink increased fluids. Jimmy Franklin

## 2013-03-24 NOTE — Progress Notes (Signed)
1 Day Post-Op Subjective: Patient reports that he feels fine. He has not voided yet. His urine was fairly clear prior to discontinuing catheter  Objective: Vital signs in last 24 hours: Temp:  [97.8 F (36.6 C)-98.1 F (36.7 C)] 97.9 F (36.6 C) (07/18 0200) Pulse Rate:  [76-106] 76 (07/18 0200) Resp:  [10-17] 16 (07/18 0200) BP: (110-146)/(64-87) 121/68 mmHg (07/18 0200) SpO2:  [94 %-100 %] 94 % (07/18 0200) Weight:  [102.513 kg (226 lb)] 102.513 kg (226 lb) (07/17 1520)  Intake/Output from previous day: 07/17 0701 - 07/18 0700 In: 6023.8 [I.V.:2123.8] Out: 5700 [Urine:5700] Intake/Output this shift: Total I/O In: 1800 [I.V.:900; Other:900] Out: 3150 [Urine:3150]  Physical Exam:  Constitutional: Vital signs reviewed. WD WN in NAD   Eyes: PERRL, No scleral icterus.   Cardiovascular: RRR Pulmonary/Chest: Normal effort Abdominal: Soft. Non-tender, non-distended, bowel sounds are normal, no masses, organomegaly, or guarding present.  Genitourinary: Extremities: No cyanosis or edema   Lab Results: No results found for this basename: HGB, HCT,  in the last 72 hours BMET No results found for this basename: NA, K, CL, CO2, GLUCOSE, BUN, CREATININE, CALCIUM,  in the last 72 hours No results found for this basename: LABPT, INR,  in the last 72 hours No results found for this basename: LABURIN,  in the last 72 hours Results for orders placed during the hospital encounter of 02/14/12  URINE CULTURE     Status: None   Collection Time    02/14/12  6:01 AM      Result Value Range Status   Specimen Description URINE, CLEAN CATCH   Final   Special Requests NONE   Final   Culture  Setup Time [id number]   Final   Colony Count >=100,000 COLONIES/ML   Final   Culture     Final   Value: STAPHYLOCOCCUS SPECIES (COAGULASE NEGATIVE)     Note: RIFAMPIN AND GENTAMICIN SHOULD NOT BE USED AS SINGLE DRUGS FOR TREATMENT OF STAPH INFECTIONS.   Report Status 02/16/2012 FINAL   Final   Organism  ID, Bacteria STAPHYLOCOCCUS SPECIES (COAGULASE NEGATIVE)   Final    Studies/Results: No results found.  Assessment/Plan:   Postop day #1. He seems to be doing well after his TURP. We will check to see how he does later on this morning after he voids.   LOS: 1 day   Chelsea Aus 03/24/2013, 6:41 AM

## 2013-03-27 NOTE — Progress Notes (Signed)
Utilization review completed.  

## 2013-05-24 NOTE — Discharge Summary (Signed)
Patient ID: Jimmy Franklin MRN: [id number] DOB/AGE: 1934/05/18 79 y.o.  Admit date: 03/23/2013 Discharge date: 05/24/2013  Primary Care Physician:  Avon Gully, MD  Discharge Diagnoses:   BPH  Consults:  None    Discharge Medications:   Medication List    STOP taking these medications       clopidogrel 75 MG tablet  Commonly known as:  PLAVIX     levofloxacin 500 MG tablet  Commonly known as:  LEVAQUIN     sulfamethoxazole-trimethoprim 800-160 MG per tablet  Commonly known as:  BACTRIM DS     tamsulosin 0.4 MG Caps capsule  Commonly known as:  FLOMAX      TAKE these medications       ALPRAZolam 0.25 MG tablet  Commonly known as:  XANAX  Take 0.25 mg by mouth every 6 (six) hours as needed. For anxiety     doxycycline 50 MG capsule  Commonly known as:  VIBRAMYCIN  Take 1 capsule (50 mg total) by mouth 2 (two) times daily.     furosemide 20 MG tablet  Commonly known as:  LASIX  Take 20 mg by mouth daily.     ibuprofen 200 MG tablet  Commonly known as:  ADVIL,MOTRIN  Take 200 mg by mouth every 6 (six) hours as needed. For pain     lisinopril 20 MG tablet  Commonly known as:  PRINIVIL,ZESTRIL  Take 20 mg by mouth every morning.     metoprolol succinate 50 MG 24 hr tablet  Commonly known as:  TOPROL-XL  Take 50 mg by mouth every morning. Take with or immediately following a meal.     ranitidine 300 MG tablet  Commonly known as:  ZANTAC  Take 300 mg by mouth at bedtime.     simvastatin 10 MG tablet  Commonly known as:  ZOCOR  Take 10 mg by mouth every morning.     venlafaxine XR 150 MG 24 hr capsule  Commonly known as:  EFFEXOR-XR  Take 150 mg by mouth every morning.         Significant Diagnostic Studies:  Dg Chest 2 View  03/17/2013   *RADIOLOGY REPORT*  Clinical Data: Preoperative respiratory films.  CHEST - 2 VIEW  Comparison: PA and lateral chest 01/03/2008.  Findings: The lungs are clear.  Heart size is normal.  No pneumothorax or pleural  effusion.  Thoracic spondylosis noted.  IMPRESSION: No acute disease.   Original Report Authenticated By: Holley Dexter, M.D.    Brief H and P: For complete details please refer to admission H and P, but in brief   79 year old male presents for TUR-P. 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 He recently was found to have high pvr urine volumes. He had a catheter placed 03/08/2013 by Dr. Laverle Patter following unsuccessful cystoscopy by me shortly before that. He was also found to have a bladder stone.His history is as follows:  He suffered a stroke in the summer of 2012, and was treated at [facility]. A catheter was placed, as he could not void adequately. He was followed up by Dr. Karleen Hampshire from his urologic standpoint. A catheter was changed several times. He was given several voiding trials, he failed these. He underwent complex urodynamics and fluoroscopic voiding study. This revealed an obstructive pattern, with a maximum flow rate of 6 cc a second. The patient's capacity was only 209 cc. Maximum pressure was 93 cm of water.  At maximum flow, pressure was 44 cm of water. He did not leak. EMG activity was normal. He had no reflux. Diverticula were noted.  The procedure was performed on 06/24/2011. He was discharged the following day, and voided adequately  Hospital Course:  The pt was admitted to the floor following his TUR-P. He had an uneventful  Postop course and voided following catheter removal on POD 1, and was subsequently discharged.  Day of Discharge BP 128/82  Pulse 70  Temp(Src) 97.6 F (36.4 C) (Oral)  Resp 20  Ht 6\' 1"  (1.854 m)  Wt 102.513 kg (226 lb)  BMI 29.82 kg/m2  SpO2 97%  No results found for this or any previous visit (from the past 24 hour(s)).  Physical Exam: General: Alert and awake oriented x3 not in any acute distress. HEENT: anicteric sclera, pupils reactive to light and accommodation CVS:  S1-S2 clear no murmur rubs or gallops Chest: clear to auscultation bilaterally, no wheezing rales or rhonchi Abdomen: soft nontender, nondistended, normal bowel sounds, no organomegaly Extremities: no cyanosis, clubbing or edema noted bilaterally Neuro: Cranial nerves II-XII intact, no focal neurological deficits  Disposition:  Home  Diet:  No restrictions  Activity:  Discussed with patient   Disposition and Follow-up:    Will arrange  TESTS THAT NEED FOLLOW-UP  Pathology review  DISCHARGE FOLLOW-UP     Follow-up Information   Follow up with Chelsea Aus, MD. (As scheduled)    Specialty:  Urology   Contact information:   [address] [place] [address] [phone]       Time spent on Discharge:  10 minutes  Signed: Chelsea Aus 05/24/2013, 5:53 AM

## 2013-05-30 ENCOUNTER — Ambulatory Visit (INDEPENDENT_AMBULATORY_CARE_PROVIDER_SITE_OTHER): Payer: Medicare Other | Admitting: Urology

## 2013-05-30 DIAGNOSIS — R82998 Other abnormal findings in urine: Secondary | ICD-10-CM

## 2013-05-30 DIAGNOSIS — N401 Enlarged prostate with lower urinary tract symptoms: Secondary | ICD-10-CM

## 2013-05-30 DIAGNOSIS — R339 Retention of urine, unspecified: Secondary | ICD-10-CM

## 2013-11-07 ENCOUNTER — Ambulatory Visit (INDEPENDENT_AMBULATORY_CARE_PROVIDER_SITE_OTHER): Payer: Medicare HMO | Admitting: Urology

## 2013-11-07 DIAGNOSIS — N390 Urinary tract infection, site not specified: Secondary | ICD-10-CM

## 2013-11-07 DIAGNOSIS — R82998 Other abnormal findings in urine: Secondary | ICD-10-CM

## 2013-11-21 ENCOUNTER — Ambulatory Visit: Payer: Medicare HMO | Admitting: Urology

## 2014-05-15 ENCOUNTER — Ambulatory Visit (INDEPENDENT_AMBULATORY_CARE_PROVIDER_SITE_OTHER): Payer: Medicare HMO | Admitting: Urology

## 2014-05-15 DIAGNOSIS — R82998 Other abnormal findings in urine: Secondary | ICD-10-CM

## 2014-05-15 DIAGNOSIS — N401 Enlarged prostate with lower urinary tract symptoms: Secondary | ICD-10-CM

## 2014-05-15 DIAGNOSIS — R339 Retention of urine, unspecified: Secondary | ICD-10-CM

## 2014-09-07 HISTORY — PX: CORONARY ANGIOPLASTY WITH STENT PLACEMENT: SHX49

## 2014-09-10 DIAGNOSIS — Z9189 Other specified personal risk factors, not elsewhere classified: Secondary | ICD-10-CM | POA: Diagnosis not present

## 2014-09-10 DIAGNOSIS — Z23 Encounter for immunization: Secondary | ICD-10-CM | POA: Diagnosis not present

## 2014-09-10 DIAGNOSIS — F331 Major depressive disorder, recurrent, moderate: Secondary | ICD-10-CM | POA: Diagnosis not present

## 2014-09-10 DIAGNOSIS — G8114 Spastic hemiplegia affecting left nondominant side: Secondary | ICD-10-CM | POA: Diagnosis not present

## 2014-09-10 DIAGNOSIS — E782 Mixed hyperlipidemia: Secondary | ICD-10-CM | POA: Diagnosis not present

## 2014-09-10 DIAGNOSIS — I10 Essential (primary) hypertension: Secondary | ICD-10-CM | POA: Diagnosis not present

## 2014-09-10 DIAGNOSIS — Z1389 Encounter for screening for other disorder: Secondary | ICD-10-CM | POA: Diagnosis not present

## 2014-10-16 ENCOUNTER — Ambulatory Visit: Payer: Medicare HMO | Admitting: Urology

## 2014-11-13 ENCOUNTER — Ambulatory Visit: Payer: Medicare HMO | Admitting: Urology

## 2014-12-04 DIAGNOSIS — D539 Nutritional anemia, unspecified: Secondary | ICD-10-CM | POA: Diagnosis not present

## 2014-12-04 DIAGNOSIS — E782 Mixed hyperlipidemia: Secondary | ICD-10-CM | POA: Diagnosis not present

## 2014-12-04 DIAGNOSIS — F331 Major depressive disorder, recurrent, moderate: Secondary | ICD-10-CM | POA: Diagnosis not present

## 2014-12-04 DIAGNOSIS — G8114 Spastic hemiplegia affecting left nondominant side: Secondary | ICD-10-CM | POA: Diagnosis not present

## 2014-12-04 DIAGNOSIS — I10 Essential (primary) hypertension: Secondary | ICD-10-CM | POA: Diagnosis not present

## 2014-12-04 DIAGNOSIS — Z9189 Other specified personal risk factors, not elsewhere classified: Secondary | ICD-10-CM | POA: Diagnosis not present

## 2014-12-11 DIAGNOSIS — E782 Mixed hyperlipidemia: Secondary | ICD-10-CM | POA: Diagnosis not present

## 2014-12-11 DIAGNOSIS — D500 Iron deficiency anemia secondary to blood loss (chronic): Secondary | ICD-10-CM | POA: Diagnosis not present

## 2014-12-11 DIAGNOSIS — I10 Essential (primary) hypertension: Secondary | ICD-10-CM | POA: Diagnosis not present

## 2014-12-11 DIAGNOSIS — F331 Major depressive disorder, recurrent, moderate: Secondary | ICD-10-CM | POA: Diagnosis not present

## 2014-12-11 DIAGNOSIS — G8114 Spastic hemiplegia affecting left nondominant side: Secondary | ICD-10-CM | POA: Diagnosis not present

## 2014-12-11 DIAGNOSIS — K5901 Slow transit constipation: Secondary | ICD-10-CM | POA: Diagnosis not present

## 2014-12-21 DIAGNOSIS — Z79899 Other long term (current) drug therapy: Secondary | ICD-10-CM | POA: Diagnosis not present

## 2014-12-21 DIAGNOSIS — N390 Urinary tract infection, site not specified: Secondary | ICD-10-CM | POA: Diagnosis not present

## 2014-12-21 DIAGNOSIS — Z8673 Personal history of transient ischemic attack (TIA), and cerebral infarction without residual deficits: Secondary | ICD-10-CM | POA: Diagnosis not present

## 2014-12-21 DIAGNOSIS — I10 Essential (primary) hypertension: Secondary | ICD-10-CM | POA: Diagnosis not present

## 2014-12-21 DIAGNOSIS — R569 Unspecified convulsions: Secondary | ICD-10-CM | POA: Diagnosis not present

## 2014-12-21 DIAGNOSIS — N3001 Acute cystitis with hematuria: Secondary | ICD-10-CM | POA: Diagnosis not present

## 2014-12-21 DIAGNOSIS — N400 Enlarged prostate without lower urinary tract symptoms: Secondary | ICD-10-CM | POA: Diagnosis not present

## 2014-12-25 DIAGNOSIS — N320 Bladder-neck obstruction: Secondary | ICD-10-CM | POA: Diagnosis not present

## 2014-12-25 DIAGNOSIS — R310 Gross hematuria: Secondary | ICD-10-CM | POA: Diagnosis not present

## 2014-12-26 ENCOUNTER — Other Ambulatory Visit: Payer: Self-pay | Admitting: Urology

## 2014-12-26 DIAGNOSIS — R310 Gross hematuria: Secondary | ICD-10-CM

## 2015-01-01 ENCOUNTER — Other Ambulatory Visit: Payer: Self-pay | Admitting: Urology

## 2015-01-02 ENCOUNTER — Ambulatory Visit (HOSPITAL_COMMUNITY)
Admission: RE | Admit: 2015-01-02 | Discharge: 2015-01-02 | Disposition: A | Discharge status: Home / Self Care | Payer: Commercial Managed Care - HMO | Source: Ambulatory Visit | Attending: Urology | Admitting: Urology

## 2015-01-02 DIAGNOSIS — N323 Diverticulum of bladder: Secondary | ICD-10-CM | POA: Diagnosis not present

## 2015-01-02 DIAGNOSIS — N401 Enlarged prostate with lower urinary tract symptoms: Secondary | ICD-10-CM | POA: Diagnosis not present

## 2015-01-02 DIAGNOSIS — R3919 Other difficulties with micturition: Secondary | ICD-10-CM | POA: Diagnosis not present

## 2015-01-02 DIAGNOSIS — R310 Gross hematuria: Secondary | ICD-10-CM | POA: Insufficient documentation

## 2015-01-02 DIAGNOSIS — N2882 Megaloureter: Secondary | ICD-10-CM | POA: Diagnosis not present

## 2015-01-02 MED ORDER — SODIUM CHLORIDE 0.9 % IV SOLN
INTRAVENOUS | Status: AC
  Filled 2015-01-02: qty 250

## 2015-01-02 MED ORDER — IOHEXOL 300 MG/ML  SOLN
125.0000 mL | Freq: Once | INTRAMUSCULAR | Status: AC | PRN
  Administered 2015-01-02: 125 mL via INTRAVENOUS

## 2015-01-15 NOTE — Patient Instructions (Addendum)
Jimmy Franklin  01/15/2015   Your procedure is scheduled on: Thursday 01/24/2015  Report to [facility] Main  Entrance and follow signs to               [name] at 0700 AM.  Call this number if you have problems the morning of surgery [phone]   Remember: ONLY 1 PERSON MAY GO WITH YOU TO SHORT STAY TO GET  READY MORNING OF [place].  Do not eat food or drink liquids :After Midnight. Wednesday NIGHT     Take these medicines the morning of surgery with A SIP OF WATER: Effexor, Dilantin, METOPROLOL                               You may not have any metal on your body including hair pins and              piercings  Do not wear jewelry, make-up, lotions, powders or perfumes.             Do not wear nail polish.  Do not shave  48 hours prior to surgery.              Men may shave face and neck.   Do not bring valuables to the hospital. [place].  Contacts, dentures or bridgework may not be worn into surgery.  Leave suitcase in the car. After surgery it may be brought to your room.     Patients discharged the day of surgery will not be allowed to drive home.  Name and phone number of your driver:  Special Instructions: N/A            _____________________________________________________________________             [facility] - Preparing for Surgery Before surgery, you can play an important role.  Because skin is not sterile, your skin needs to be as free of germs as possible.  You can reduce the number of germs on your skin by washing with CHG (chlorahexidine gluconate) soap before surgery.  CHG is an antiseptic cleaner which kills germs and bonds with the skin to continue killing germs even after washing. Please DO NOT use if you have an allergy to CHG or antibacterial soaps.  If your skin becomes reddened/irritated stop using the CHG and inform your nurse when you arrive at Short Stay. Do  not shave (including legs and underarms) for at least 48 hours prior to the first CHG shower.  You may shave your face/neck. Please follow these instructions carefully:  1.  Shower with CHG Soap the night before surgery and the  morning of Surgery.  2.  If you choose to wash your hair, wash your hair first as usual with your  normal  shampoo.  3.  After you shampoo, rinse your hair and body thoroughly to remove the  shampoo.                           4.  Use CHG as you would any other liquid soap.  You can apply chg directly  to the skin and wash  Gently with a scrungie or clean washcloth.  5.  Apply the CHG Soap to your body ONLY FROM THE NECK DOWN.   Do not use on face/ open                           Wound or open sores. Avoid contact with eyes, ears mouth and genitals (private parts).                       Wash face,  Genitals (private parts) with your normal soap.             6.  Wash thoroughly, paying special attention to the area where your surgery  will be performed.  7.  Thoroughly rinse your body with warm water from the neck down.  8.  DO NOT shower/wash with your normal soap after using and rinsing off  the CHG Soap.                9.  Pat yourself dry with a clean towel.            10.  Wear clean pajamas.            11.  Place clean sheets on your bed the night of your first shower and do not  sleep with pets. Day of Surgery : Do not apply any lotions/deodorants the morning of surgery.  Please wear clean clothes to the hospital/surgery center.  FAILURE TO FOLLOW THESE INSTRUCTIONS MAY RESULT IN THE CANCELLATION OF YOUR SURGERY PATIENT SIGNATURE_________________________________  NURSE SIGNATURE__________________________________  ________________________________________________________________________

## 2015-01-16 ENCOUNTER — Encounter (HOSPITAL_COMMUNITY): Payer: Self-pay | Admitting: Emergency Medicine

## 2015-01-16 ENCOUNTER — Emergency Department (HOSPITAL_COMMUNITY)
Admission: EM | Admit: 2015-01-16 | Discharge: 2015-01-17 | Disposition: A | Discharge status: Home / Self Care | Payer: Commercial Managed Care - HMO | Attending: Emergency Medicine | Admitting: Emergency Medicine

## 2015-01-16 DIAGNOSIS — R339 Retention of urine, unspecified: Secondary | ICD-10-CM | POA: Insufficient documentation

## 2015-01-16 LAB — BASIC METABOLIC PANEL
Anion gap: 8 (ref 5–15)
BUN: 25 mg/dL — ABNORMAL HIGH (ref 6–20)
CO2: 21 mmol/L — ABNORMAL LOW (ref 22–32)
Calcium: 8.8 mg/dL — ABNORMAL LOW (ref 8.9–10.3)
Chloride: 111 mmol/L (ref 101–111)
Creatinine, Ser: 1.16 mg/dL (ref 0.61–1.24)
GFR calc Af Amer: >60 mL/min (ref >60)
GFR, EST NON AFRICAN AMERICAN: 58 mL/min — AB (ref >60)
Glucose, Bld: 120 mg/dL — ABNORMAL HIGH (ref 70–99)
POTASSIUM: 4.9 mmol/L (ref 3.5–5.1)
Sodium: 140 mmol/L (ref 135–145)

## 2015-01-16 LAB — CBC WITH DIFFERENTIAL/PLATELET
Basophils Absolute: 0 10*3/uL (ref 0.0–0.1)
Basophils Relative: 0 % (ref 0–1)
EOS ABS: 0.1 10*3/uL (ref 0.0–0.7)
Eosinophils Relative: 1 % (ref 0–5)
HCT: 35.1 % — ABNORMAL LOW (ref 39.0–52.0)
HEMOGLOBIN: 10.4 g/dL — AB (ref 13.0–17.0)
LYMPHS ABS: 1.6 10*3/uL (ref 0.7–4.0)
Lymphocytes Relative: 13 % (ref 12–46)
MCH: 21.3 pg — AB (ref 26.0–34.0)
MCHC: 29.6 g/dL — ABNORMAL LOW (ref 30.0–36.0)
MCV: 71.9 fL — AB (ref 78.0–100.0)
MONOS PCT: 9 % (ref 3–12)
Monocytes Absolute: 1 10*3/uL (ref 0.1–1.0)
NEUTROS PCT: 77 % (ref 43–77)
Neutro Abs: 9.3 10*3/uL — ABNORMAL HIGH (ref 1.7–7.7)
Platelets: 189 10*3/uL (ref 150–400)
RBC: 4.88 MIL/uL (ref 4.22–5.81)
RDW: 17.2 % — ABNORMAL HIGH (ref 11.5–15.5)
WBC: 12.1 10*3/uL — ABNORMAL HIGH (ref 4.0–10.5)

## 2015-01-16 LAB — POC OCCULT BLOOD, ED: Fecal Occult Bld: NEGATIVE

## 2015-01-16 MED ORDER — HYDROCODONE-ACETAMINOPHEN 5-325 MG PO TABS
1.0000 | ORAL_TABLET | Freq: Once | ORAL | Status: AC
  Administered 2015-01-16: 1 via ORAL
  Filled 2015-01-16: qty 1

## 2015-01-16 NOTE — ED Notes (Signed)
Pt states that he had foley placed 2 weeks ago for urinary retention and it is clogged at this time.  Is scheduled for bladder surgery next week.

## 2015-01-16 NOTE — ED Notes (Signed)
Bed: WA01 Expected date:  Expected time:  Means of arrival:  Comments: tx from [facility] for foley placement

## 2015-01-16 NOTE — ED Notes (Signed)
Pt transferred from [facility] to see a urologist d/t urinary retention and foley catheter placed.  Pt reports feeling uncomfortable and "full".  Pt reports staff in [facility] ED tried to insert catheter x 3 without success.  Pt denies any other complaints at this time.

## 2015-01-16 NOTE — ED Notes (Signed)
Attempt x 3 for insertion of urinary catheter.  #4fr coude, #14 fr regular, and then #11fr coude without success.  Pt tolerated all attempts well.

## 2015-01-16 NOTE — ED Provider Notes (Signed)
CSN: [id number]     Arrival date & time 01/16/15  1613 History   First MD Initiated Contact with Patient 01/16/15 1723     Chief Complaint  Patient presents with  . Urinary Retention     (Consider location/radiation/quality/duration/timing/severity/associated sxs/prior Treatment) HPI Comments:  Patient with history of prostatic hypertrophy has indwelling Foley catheter placed about 3 weeks ago in the urology office. Wife reports he was very difficult placement. It is not been draining since this morning. Patient has been unable to urinate and feels pressure in his bladder. He denies any vomiting but has had nausea. Denies any back pain. Denies any testicular pain. He states his urine is foul-smelling draining around the catheter. He is scheduled for a bladder resection surgery next week.  The history is provided by the patient and the spouse.    Past Medical History  Diagnosis Date  . Bowel habit changes 03/21/2011  . Hx of colonic polyps   . Chronic constipation 08  . Mixed hyperlipidemia   . Unspecified essential hypertension   . Polymyalgia rheumatica   . Stroke     CVA- 3 yrs ago-left sided weakness, mild residual  . GERD (gastroesophageal reflux disease)   . Foley catheter in place 03-17-13  . COPD (chronic obstructive pulmonary disease)   . Seizures     caused by UTI-none recent- over 1 yr  . Hearing loss of both ears   . Depression    Past Surgical History  Procedure Laterality Date  . Colonoscopy  07/04/07  . Cataract extraction, bilateral    . Tonsillectomy    . Cholecystectomy      lap. gallbladder removal  . Transurethral resection of prostate N/A 03/23/2013    Procedure: TRANSURETHRAL RESECTION OF THE PROSTATE (TURP);  Surgeon: Franchot Gallo, MD;  Location: [facility] ORS;  Service: Urology;  Laterality: N/A;   History reviewed. No pertinent family history. History  Substance Use Topics  . Smoking status: Former Smoker    Quit date: 03/18/1967  . Smokeless  tobacco: Not on file  . Alcohol Use: No    Review of Systems  Constitutional: Negative for fever, activity change and appetite change.  Eyes: Negative for visual disturbance.  Respiratory: Negative for cough, chest tightness and shortness of breath.   Cardiovascular: Negative for chest pain.  Gastrointestinal: Positive for abdominal pain. Negative for nausea and vomiting.  Genitourinary: Positive for decreased urine volume. Negative for dysuria and hematuria.  Musculoskeletal: Negative for myalgias, back pain and arthralgias.  Skin: Negative for rash.  Neurological: Negative for dizziness, weakness, light-headedness and headaches.  A complete 10 system review of systems was obtained and all systems are negative except as noted in the HPI and PMH.      Allergies  Review of patient's allergies indicates no known allergies.  Home Medications   Prior to Admission medications   Medication Sig Start Date End Date Taking? Authorizing Provider  ALPRAZolam (XANAX) 0.25 MG tablet Take 0.25 mg by mouth every 6 (six) hours as needed. For anxiety   Yes Historical Provider, MD  clopidogrel (PLAVIX) 75 MG tablet Take 75 mg by mouth every morning.   Yes Historical Provider, MD  furosemide (LASIX) 20 MG tablet Take 20 mg by mouth every morning.    Yes Historical Provider, MD  iron polysaccharides (NIFEREX) 150 MG capsule Take 150 mg by mouth daily.   Yes Historical Provider, MD  lisinopril (PRINIVIL,ZESTRIL) 20 MG tablet Take 20 mg by mouth every morning.    Yes  Historical Provider, MD  metoprolol succinate (TOPROL-XL) 50 MG 24 hr tablet Take 50 mg by mouth every morning. Take with or immediately following a meal.   Yes Historical Provider, MD  phenytoin (DILANTIN) 100 MG ER capsule Take 100-200 mg by mouth 2 (two) times daily. 2 in the morning and 1 in the evening   Yes Historical Provider, MD  polyethylene glycol powder (GLYCOLAX/MIRALAX) powder Take 17 g by mouth every morning.  12/11/14  Yes  Historical Provider, MD  ranitidine (ZANTAC) 300 MG tablet Take 300 mg by mouth at bedtime.   Yes Historical Provider, MD  simvastatin (ZOCOR) 10 MG tablet Take 10 mg by mouth at bedtime.    Yes Historical Provider, MD  trimethoprim (TRIMPEX) 100 MG tablet Take 100 mg by mouth at bedtime.   Yes Historical Provider, MD  venlafaxine XR (EFFEXOR-XR) 150 MG 24 hr capsule Take 150 mg by mouth every morning.   Yes Historical Provider, MD  doxycycline (VIBRAMYCIN) 50 MG capsule Take 1 capsule (50 mg total) by mouth 2 (two) times daily. Patient not taking: Reported on 01/14/2015 03/24/13   Franchot Gallo, MD   BP 116/69 mmHg  Pulse 77  Temp(Src) 98.9 F (37.2 C) (Oral)  Resp 18  Ht 6\' 1"  (1.854 m)  Wt 218 lb (98.884 kg)  BMI 28.77 kg/m2  SpO2 95% Physical Exam  Constitutional: He is oriented to person, place, and time. He appears well-developed and well-nourished. No distress.  HENT:  Head: Normocephalic and atraumatic.  Mouth/Throat: Oropharynx is clear and moist. No oropharyngeal exudate.  Eyes: Conjunctivae and EOM are normal. Pupils are equal, round, and reactive to light.  Neck: Normal range of motion. Neck supple.  No meningismus.  Cardiovascular: Normal rate, regular rhythm, normal heart sounds and intact distal pulses.   No murmur heard. Pulmonary/Chest: Effort normal and breath sounds normal. No respiratory distress.  Abdominal: Soft. There is tenderness. There is no rebound and no guarding.  Suprapubic tenderness  Genitourinary:   Indwelling Foley in place. Testicles nontender. Bag is empty.  No CVA tenderness  Musculoskeletal: Normal range of motion. He exhibits no edema or tenderness.  Neurological: He is alert and oriented to person, place, and time. No cranial nerve deficit. He exhibits normal muscle tone. Coordination normal.  No ataxia on finger to nose bilaterally. No pronator drift. 5/5 strength throughout. CN 2-12 intact. Negative Romberg. Equal grip strength. Sensation  intact. Gait is normal.   Skin: Skin is warm.  Psychiatric: He has a normal mood and affect. His behavior is normal.  Nursing note and vitals reviewed.   ED Course  Procedures (including critical care time) Labs Review Labs Reviewed  CBC WITH DIFFERENTIAL/PLATELET - Abnormal; Notable for the following:    WBC 12.1 (*)    Hemoglobin 10.4 (*)    HCT 35.1 (*)    MCV 71.9 (*)    MCH 21.3 (*)    MCHC 29.6 (*)    RDW 17.2 (*)    Neutro Abs 9.3 (*)    All other components within normal limits  BASIC METABOLIC PANEL - Abnormal; Notable for the following:    CO2 21 (*)    Glucose, Bld 120 (*)    BUN 25 (*)    Calcium 8.8 (*)    GFR calc non Af Amer 58 (*)    All other components within normal limits  URINALYSIS, ROUTINE W REFLEX MICROSCOPIC  POC OCCULT BLOOD, ED    Imaging Review No results found.   EKG  Interpretation None      MDM   Final diagnoses:  Urinary retention    Foley catheter not draining. History of difficult Foley placement. We'll attempt irrigation.   catheter is unable to be irrigated.  It will need to be replaced  approximately 600 mL on bladder scan. Foley catheter unable to be irrigated. Creatinine 1.1. Hemoglobin has decreased some. Patient with history of anemia on iron.  Stools dark but FOBT negative.   Catheter was not able to be replaced after removed. Discussed with Dr. Baltazar Najjar of urology who will evaluate patient [facility]. Dr. Darl Householder aware of transfer to the ED.  Ezequiel Essex, MD 01/17/15 [phone]

## 2015-01-16 NOTE — ED Provider Notes (Signed)
CSN: [id number]     Arrival date & time 01/16/15  1613 History   First MD Initiated Contact with Patient 01/16/15 1723     Chief Complaint  Patient presents with  . Urinary Retention     (Consider location/radiation/quality/duration/timing/severity/associated sxs/prior Treatment) Patient is a 81 y.o. male presenting with abdominal pain.  Abdominal Pain Pain location:  Generalized Pain quality: bloating   Pain radiates to:  Does not radiate Pain severity:  Moderate Onset quality:  Gradual Duration:  1 day Timing:  Constant Progression:  Unchanged Chronicity:  Recurrent Context comment:  Ho urinary retention, scheduled for surgery, had catheter in place, seen at [facility], attempted catheter x3 unsuccessfully after unsuccessful irrigation Relieved by:  Nothing Worsened by:  Nothing tried Associated symptoms comment:  Anuria x 24 hours   Past Medical History  Diagnosis Date  . Bowel habit changes 03/21/2011  . Hx of colonic polyps   . Chronic constipation 08  . Mixed hyperlipidemia   . Unspecified essential hypertension   . Polymyalgia rheumatica   . Stroke     CVA- 3 yrs ago-left sided weakness, mild residual  . GERD (gastroesophageal reflux disease)   . Foley catheter in place 03-17-13  . COPD (chronic obstructive pulmonary disease)   . Seizures     caused by UTI-none recent- over 1 yr  . Hearing loss of both ears   . Depression    Past Surgical History  Procedure Laterality Date  . Colonoscopy  07/04/07  . Cataract extraction, bilateral    . Tonsillectomy    . Cholecystectomy      lap. gallbladder removal  . Transurethral resection of prostate N/A 03/23/2013    Procedure: TRANSURETHRAL RESECTION OF THE PROSTATE (TURP);  Surgeon: Franchot Gallo, MD;  Location: [facility];  Service: Urology;  Laterality: N/A;   History reviewed. No pertinent family history. History  Substance Use Topics  . Smoking status: Former Smoker    Quit date: 03/18/1967  . Smokeless  tobacco: Not on file  . Alcohol Use: No    Review of Systems  Gastrointestinal: Positive for abdominal pain.  All other systems reviewed and are negative.     Allergies  Review of patient's allergies indicates no known allergies.  Home Medications   Prior to Admission medications   Medication Sig Start Date End Date Taking? Authorizing Provider  ALPRAZolam (XANAX) 0.25 MG tablet Take 0.25 mg by mouth every 6 (six) hours as needed. For anxiety   Yes Historical Provider, MD  clopidogrel (PLAVIX) 75 MG tablet Take 75 mg by mouth every morning.   Yes Historical Provider, MD  furosemide (LASIX) 20 MG tablet Take 20 mg by mouth every morning.    Yes Historical Provider, MD  iron polysaccharides (NIFEREX) 150 MG capsule Take 150 mg by mouth daily.   Yes Historical Provider, MD  lisinopril (PRINIVIL,ZESTRIL) 20 MG tablet Take 20 mg by mouth every morning.    Yes Historical Provider, MD  metoprolol succinate (TOPROL-XL) 50 MG 24 hr tablet Take 50 mg by mouth every morning. Take with or immediately following a meal.   Yes Historical Provider, MD  phenytoin (DILANTIN) 100 MG ER capsule Take 100-200 mg by mouth 2 (two) times daily. 2 in the morning and 1 in the evening   Yes Historical Provider, MD  polyethylene glycol powder (GLYCOLAX/MIRALAX) powder Take 17 g by mouth every morning.  12/11/14  Yes Historical Provider, MD  ranitidine (ZANTAC) 300 MG tablet Take 300 mg by mouth at bedtime.  Yes Historical Provider, MD  simvastatin (ZOCOR) 10 MG tablet Take 10 mg by mouth at bedtime.    Yes Historical Provider, MD  trimethoprim (TRIMPEX) 100 MG tablet Take 100 mg by mouth at bedtime.   Yes Historical Provider, MD  venlafaxine XR (EFFEXOR-XR) 150 MG 24 hr capsule Take 150 mg by mouth every morning.   Yes Historical Provider, MD  doxycycline (VIBRAMYCIN) 50 MG capsule Take 1 capsule (50 mg total) by mouth 2 (two) times daily. Patient not taking: Reported on 01/14/2015 03/24/13   Franchot Gallo, MD    BP 140/76 mmHg  Pulse 97  Temp(Src) 98.9 F (37.2 C) (Oral)  Resp 16  Ht 6\' 1"  (1.854 m)  Wt 218 lb (98.884 kg)  BMI 28.77 kg/m2  SpO2 94% Physical Exam  Constitutional: He is oriented to person, place, and time. He appears well-developed and well-nourished.  HENT:  Head: Normocephalic and atraumatic.  Eyes: Conjunctivae and EOM are normal.  Neck: Normal range of motion. Neck supple.  Cardiovascular: Normal rate, regular rhythm and normal heart sounds.   Pulmonary/Chest: Effort normal and breath sounds normal. No respiratory distress.  Abdominal: He exhibits no distension. There is tenderness in the suprapubic area. There is no rigidity, no rebound and no guarding.  Musculoskeletal: Normal range of motion.  Neurological: He is alert and oriented to person, place, and time.  Skin: Skin is warm and dry.  Vitals reviewed.   ED Course  Procedures (including critical care time) Labs Review Labs Reviewed  CBC WITH DIFFERENTIAL/PLATELET - Abnormal; Notable for the following:    WBC 12.1 (*)    Hemoglobin 10.4 (*)    HCT 35.1 (*)    MCV 71.9 (*)    MCH 21.3 (*)    MCHC 29.6 (*)    RDW 17.2 (*)    Neutro Abs 9.3 (*)    All other components within normal limits  BASIC METABOLIC PANEL - Abnormal; Notable for the following:    CO2 21 (*)    Glucose, Bld 120 (*)    BUN 25 (*)    Calcium 8.8 (*)    GFR calc non Af Amer 58 (*)    All other components within normal limits  POC OCCULT BLOOD, ED    Imaging Review No results found.   EKG Interpretation None      MDM   Final diagnoses:  Urinary retention    81 y.o. male with pertinent PMH of urinary retention with chronic indwelling foley presents with urinary retention and inability to place foley.  Pt seen at [facility] where indwelling foley was unable to be irrigated and had no output.  Attempted to replace, unsuccessfully so sent here for urology eval.  Urology consutled.    Urology offered pt foley, which he  refused.  They will see him in clinic for fu.  DC home in stable condition  I have reviewed all laboratory and imaging studies if ordered as above  1. Urinary retention         Debby Freiberg, MD 01/17/15 [id number]

## 2015-01-17 ENCOUNTER — Encounter (HOSPITAL_COMMUNITY): Payer: Self-pay

## 2015-01-17 ENCOUNTER — Encounter (HOSPITAL_COMMUNITY)
Admission: RE | Admit: 2015-01-17 | Discharge: 2015-01-17 | Disposition: A | Discharge status: Home / Self Care | Payer: Commercial Managed Care - HMO | Source: Ambulatory Visit | Attending: Urology | Admitting: Urology

## 2015-01-17 DIAGNOSIS — Z01810 Encounter for preprocedural cardiovascular examination: Secondary | ICD-10-CM | POA: Diagnosis not present

## 2015-01-17 DIAGNOSIS — I10 Essential (primary) hypertension: Secondary | ICD-10-CM | POA: Diagnosis not present

## 2015-01-17 DIAGNOSIS — Z01812 Encounter for preprocedural laboratory examination: Secondary | ICD-10-CM | POA: Diagnosis not present

## 2015-01-17 DIAGNOSIS — R339 Retention of urine, unspecified: Secondary | ICD-10-CM | POA: Diagnosis not present

## 2015-01-17 DIAGNOSIS — N320 Bladder-neck obstruction: Secondary | ICD-10-CM | POA: Diagnosis not present

## 2015-01-17 HISTORY — DX: Inflammatory liver disease, unspecified: K75.9

## 2015-01-17 NOTE — Progress Notes (Signed)
   01/17/15 1419  OBSTRUCTIVE SLEEP APNEA  Have you ever been diagnosed with sleep apnea through a sleep study? No  Do you snore loudly (loud enough to be heard through closed doors)?  0  Do you often feel tired, fatigued, or sleepy during the daytime? 0  Has anyone observed you stop breathing during your sleep? 0  Do you have, or are you being treated for high blood pressure? 1  BMI more than 35 kg/m2? 0  Age over 79 years old? 1  Neck circumference greater than 40 cm/16 inches? 1  Gender: 1

## 2015-01-17 NOTE — Consult Note (Signed)
Urology Consult   Physician requesting consult: ER MD not in record at this time  Reason for consult: Urinary Retention and difficult foley placement  History of Present Illness: Jimmy Franklin is a 81 y.o. male with a history of bladder neck contracture who is been seen by Dr. Diona Fanti primarily.  He also has a history of gross hematuria and is on Plavix. He was last seen in clinic about one month ago at which time he was having gross hematuria and found to have a bladder neck contracture with high post void residual. At that time, a Foley catheter was placed after sequential dilation of bladder neck contracture and cystoscopy. This was very uncomfortable for him and he tolerated it poorly.  He had this 39 French Foley catheter for the past few weeks with the plan to undergo transurethral incision of bladder neck next week.  This morning, the catheter stopped draining and he began having leakage around the catheter which was associated with urethral pain. This continued throughout the day and he came to the emergency room this evening for evaluation. He was first seen at an outside emergency room where the Foley catheter was removed and a new catheter was unable to be placed. He was then transferred here for further evaluation.  He denies any recent fevers, chills, nausea, vomiting. He has been voiding small amounts frequently. When I was in the room with him, he voided approximately 150 mL of urine. I bladder scanned him following this and he had anywhere from 600 - 800 mL in his bladder depending on the scan. He was not in any significant discomfort. He was extremely reluctant to undergo cystoscopy with repeat dilation and Foley catheter placement.  Past Medical History  Diagnosis Date  . Bowel habit changes 03/21/2011  . Hx of colonic polyps   . Chronic constipation 08  . Mixed hyperlipidemia   . Unspecified essential hypertension   . Polymyalgia rheumatica   . Stroke     CVA- 3 yrs  ago-left sided weakness, mild residual  . GERD (gastroesophageal reflux disease)   . Foley catheter in place 03-17-13  . COPD (chronic obstructive pulmonary disease)   . Seizures     caused by UTI-none recent- over 1 yr  . Hearing loss of both ears   . Depression     Past Surgical History  Procedure Laterality Date  . Colonoscopy  07/04/07  . Cataract extraction, bilateral    . Tonsillectomy    . Cholecystectomy      lap. gallbladder removal  . Transurethral resection of prostate N/A 03/23/2013    Procedure: TRANSURETHRAL RESECTION OF THE PROSTATE (TURP);  Surgeon: Franchot Gallo, MD;  Location: [facility];  Service: Urology;  Laterality: N/A;    Medications:  Home meds:    Medication List    ASK your doctor about these medications        ALPRAZolam 0.25 MG tablet  Commonly known as:  XANAX  Take 0.25 mg by mouth every 6 (six) hours as needed. For anxiety     clopidogrel 75 MG tablet  Commonly known as:  PLAVIX  Take 75 mg by mouth every morning.     doxycycline 50 MG capsule  Commonly known as:  VIBRAMYCIN  Take 1 capsule (50 mg total) by mouth 2 (two) times daily.     furosemide 20 MG tablet  Commonly known as:  LASIX  Take 20 mg by mouth every morning.     iron polysaccharides 150 MG  capsule  Commonly known as:  NIFEREX  Take 150 mg by mouth daily.     lisinopril 20 MG tablet  Commonly known as:  PRINIVIL,ZESTRIL  Take 20 mg by mouth every morning.     metoprolol succinate 50 MG 24 hr tablet  Commonly known as:  TOPROL-XL  Take 50 mg by mouth every morning. Take with or immediately following a meal.     phenytoin 100 MG ER capsule  Commonly known as:  DILANTIN  Take 100-200 mg by mouth 2 (two) times daily. 2 in the morning and 1 in the evening     polyethylene glycol powder powder  Commonly known as:  GLYCOLAX/MIRALAX  Take 17 g by mouth every morning.     ranitidine 300 MG tablet  Commonly known as:  ZANTAC  Take 300 mg by mouth at bedtime.      simvastatin 10 MG tablet  Commonly known as:  ZOCOR  Take 10 mg by mouth at bedtime.     trimethoprim 100 MG tablet  Commonly known as:  TRIMPEX  Take 100 mg by mouth at bedtime.     venlafaxine XR 150 MG 24 hr capsule  Commonly known as:  EFFEXOR-XR  Take 150 mg by mouth every morning.        Scheduled Meds: Continuous Infusions: PRN Meds:.  Allergies: No Known Allergies  History reviewed. No pertinent family history.  Social History:  reports that he quit smoking about 47 years ago. He does not have any smokeless tobacco history on file. He reports that he does not drink alcohol or use illicit drugs.  ROS: A complete review of systems was performed.  All systems are negative except for pertinent findings as noted.  Physical Exam:  Vital signs in last 24 hours: Temp:  [98.9 F (37.2 C)] 98.9 F (37.2 C) (05/11 1624) Pulse Rate:  [77-84] 77 (05/11 2315) Resp:  [14-18] 18 (05/11 2315) BP: (113-133)/(67-88) 116/69 mmHg (05/11 2315) SpO2:  [93 %-100 %] 95 % (05/11 2315) Weight:  [218 lb (98.884 kg)] 218 lb (98.884 kg) (05/11 1624) Constitutional:  Alert and oriented, No acute distress Cardiovascular: Regular rate and rhythm, No JVD Respiratory: Normal respiratory effort, Lungs clear bilaterally GI: Abdomen is soft, nontender, nondistended, no abdominal masses Genitourinary: No CVAT. Normal male phallus, testes are descended bilaterally and non-tender and without masses, scrotum is normal in appearance without lesions or masses, perineum is normal on inspection. Lymphatic: No lymphadenopathy Neurologic: Grossly intact, no focal deficits Psychiatric: Normal mood and affect  Laboratory Data:   Recent Labs  01/16/15 1754  WBC 12.1*  HGB 10.4*  HCT 35.1*  PLT 189     Recent Labs  01/16/15 1754  NA 140  K 4.9  CL 111  GLUCOSE 120*  BUN 25*  CALCIUM 8.8*  CREATININE 1.16     Results for orders placed or performed during the hospital encounter of 01/16/15  (from the past 24 hour(s))  CBC with Differential/Platelet     Status: Abnormal   Collection Time: 01/16/15  5:54 PM  Result Value Ref Range   WBC 12.1 (H) 4.0 - 10.5 K/uL   RBC 4.88 4.22 - 5.81 MIL/uL   Hemoglobin 10.4 (L) 13.0 - 17.0 g/dL   HCT 35.1 (L) 39.0 - 52.0 %   MCV 71.9 (L) 78.0 - 100.0 fL   MCH 21.3 (L) 26.0 - 34.0 pg   MCHC 29.6 (L) 30.0 - 36.0 g/dL   RDW 17.2 (H) 11.5 - 15.5 %  Platelets 189 150 - 400 K/uL   Neutrophils Relative % 77 43 - 77 %   Neutro Abs 9.3 (H) 1.7 - 7.7 K/uL   Lymphocytes Relative 13 12 - 46 %   Lymphs Abs 1.6 0.7 - 4.0 K/uL   Monocytes Relative 9 3 - 12 %   Monocytes Absolute 1.0 0.1 - 1.0 K/uL   Eosinophils Relative 1 0 - 5 %   Eosinophils Absolute 0.1 0.0 - 0.7 K/uL   Basophils Relative 0 0 - 1 %   Basophils Absolute 0.0 0.0 - 0.1 K/uL  Basic metabolic panel     Status: Abnormal   Collection Time: 01/16/15  5:54 PM  Result Value Ref Range   Sodium 140 135 - 145 mmol/L   Potassium 4.9 3.5 - 5.1 mmol/L   Chloride 111 101 - 111 mmol/L   CO2 21 (L) 22 - 32 mmol/L   Glucose, Bld 120 (H) 70 - 99 mg/dL   BUN 25 (H) 6 - 20 mg/dL   Creatinine, Ser 1.16 0.61 - 1.24 mg/dL   Calcium 8.8 (L) 8.9 - 10.3 mg/dL   GFR calc non Af Amer 58 (L) >60 mL/min   GFR calc Af Amer >60 >60 mL/min   Anion gap 8 5 - 15  POC occult blood, ED RN will collect     Status: None   Collection Time: 01/16/15  9:09 PM  Result Value Ref Range   Fecal Occult Bld NEGATIVE NEGATIVE   No results found for this or any previous visit (from the past 240 hour(s)).  Renal Function:  Recent Labs  01/16/15 1754  CREATININE 1.16   Estimated Creatinine Clearance: 63.9 mL/min (by C-G formula based on Cr of 1.16).  Radiologic Imaging: No results found.  I independently reviewed the above imaging studies.  Impression/Recommendation 81 year old with urinary retention associated with bladder neck contracture. He has a large volume in his bladder but is able to void upwards of  150 mL at a time. His creatinine is 1.16.  I offered cystoscopy with likely dilation and Foley catheter placement. He was extremely reluctant to undergo this given his prior significant discomfort with the experience.  He is scheduled for preoperative appointment tomorrow. Given that he is able to void albeit small amounts, we feel it is reasonable for him to continue as he is. We would like him to come in tomorrow for repeat evaluation to confirm that he is able to continue voiding - to avoid higher volumes in his bladder.

## 2015-01-17 NOTE — ED Notes (Signed)
MD at bedside.  Urologist

## 2015-01-17 NOTE — Progress Notes (Signed)
Cbc with diff, bmp 01/16/15 epic.  States no catheter at this time.  States has stopped plavix as instructed

## 2015-01-17 NOTE — Discharge Instructions (Signed)
Acute Urinary Retention °Acute urinary retention is the temporary inability to urinate. °This is a common problem in older men. As men age their prostates become larger and block the flow of urine from the bladder. This is usually a problem that has come on gradually.  °HOME CARE INSTRUCTIONS °If you are sent home with a Foley catheter and a drainage system, you will need to discuss the best course of action with your health care provider. While the catheter is in, maintain a good intake of fluids. Keep the drainage bag emptied and lower than your catheter. This is so that contaminated urine will not flow back into your bladder, which could lead to a urinary tract infection. °There are two main types of drainage bags. One is a large bag that usually is used at night. It has a good capacity that will allow you to sleep through the night without having to empty it. The second type is called a leg bag. It has a smaller capacity, so it needs to be emptied more frequently. However, the main advantage is that it can be attached by a leg strap and can go underneath your clothing, allowing you the freedom to move about or leave your home. °Only take over-the-counter or prescription medicines for pain, discomfort, or fever as directed by your health care provider.  °SEEK MEDICAL CARE IF: °· You develop a low-grade fever. °· You experience spasms or leakage of urine with the spasms. °SEEK IMMEDIATE MEDICAL CARE IF:  °· You develop chills or fever. °· Your catheter stops draining urine. °· Your catheter falls out. °· You start to develop increased bleeding that does not respond to rest and increased fluid intake. °MAKE SURE YOU: °· Understand these instructions. °· Will watch your condition. °· Will get help right away if you are not doing well or get worse. °Document Released: 11/30/2000 Document Revised: 08/29/2013 Document Reviewed: 02/02/2013 °Exit[employer]® Patient Information ©2015 [employer]. This information is not  intended to replace advice given to you by your health care provider. Make sure you discuss any questions you have with your health care provider. ° °

## 2015-01-23 NOTE — H&P (Signed)
Urology History and Physical Exam  CC: Difficulty voiding  HPI: 81 year old male presents for cysto/TUR-BNC for management of a recurrent BNC. He underwent holmium laser TUR-P 10/12. 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 He has recently developed recurrent obstruction and has been found to have a [place]. He originally had a catheter placed in the ER for the obstruction, but the catheter recently clogged, and was removed. He did not want it replaced during a recent office visit. He presents now for TUR-BNC  PMH: Past Medical History  Diagnosis Date  . Bowel habit changes 03/21/2011  . Hx of colonic polyps   . Chronic constipation 08  . Mixed hyperlipidemia   . Unspecified essential hypertension   . Polymyalgia rheumatica   . Stroke     CVA- 3 yrs ago-left sided weakness, mild residual  . GERD (gastroesophageal reflux disease)   . Foley catheter in place 03-17-13  . COPD (chronic obstructive pulmonary disease)   . Hearing loss of both ears   . Depression   . Seizures     caused by UTI-none recent- over 1 yr  . Hepatitis     20 yrs ago    PSH: Past Surgical History  Procedure Laterality Date  . Colonoscopy  07/04/07  . Cataract extraction, bilateral    . Tonsillectomy    . Cholecystectomy      lap. gallbladder removal  . Transurethral resection of prostate N/A 03/23/2013    Procedure: TRANSURETHRAL RESECTION OF THE PROSTATE (TURP);  Surgeon: Franchot Gallo, MD;  Location: [facility];  Service: Urology;  Laterality: N/A;    Allergies: No Known Allergies  Medications: No prescriptions prior to admission     Social History: History   Social History  . Marital Status: Married    Spouse Name: N/A  . Number of Children: N/A  . Years of Education: N/A   Occupational History  . Not on file.   Social History Main Topics  . Smoking status: Former Smoker    Quit date: 03/18/1967  .  Smokeless tobacco: Former Systems developer  . Alcohol Use: No  . Drug Use: No  . Sexual Activity: Not Currently   Other Topics Concern  . Not on file   Social History Narrative    Family History: No family history on file.  Review of Systems: Positive: Difficulty voiding Negative: A further 10 point review of systems was negative except what is listed in the HPI.                  Physical Exam: @VITALS2 @ General: No acute distress.  Awake. Head:  Normocephalic.  Atraumatic. ENT:  EOMI.  Mucous membranes moist Neck:  Supple.  No lymphadenopathy. CV:  S1 present. S2 present. Regular rate. Pulmonary: Equal effort bilaterally.  Clear to auscultation bilaterally. Abdomen: Soft.  Nontender to palpation. Skin:  Normal turgor.  No visible rash. Extremity: No gross deformity of bilateral upper extremities.  No gross deformity of  lower extremities. Neurologic: Alert. Appropriate mood.   Studies:  No results for input(s): HGB, WBC, PLT in the last 72 hours.  No results for input(s): NA, K, CL, CO2, BUN, CREATININE, CALCIUM, GFRNONAA, GFRAA in the last 72 hours.  Invalid input(s): MAGNESIUM   No results for input(s): INR, APTT in the last 72 hours.  Invalid input(s): PT   Invalid input(s): ABG    Assessment:  Bladder neck contracture  Plan: TUR-BNC

## 2015-01-24 ENCOUNTER — Ambulatory Visit (HOSPITAL_COMMUNITY): Payer: Commercial Managed Care - HMO | Admitting: Anesthesiology

## 2015-01-24 ENCOUNTER — Encounter (HOSPITAL_COMMUNITY): Payer: Self-pay | Admitting: *Deleted

## 2015-01-24 ENCOUNTER — Observation Stay (HOSPITAL_COMMUNITY)
Admission: RE | Admit: 2015-01-24 | Discharge: 2015-01-25 | Disposition: A | Discharge status: Home / Self Care | Payer: Commercial Managed Care - HMO | Source: Ambulatory Visit | Attending: Urology | Admitting: Urology

## 2015-01-24 ENCOUNTER — Encounter (HOSPITAL_COMMUNITY)
Admission: RE | Disposition: A | Discharge status: Home / Self Care | Payer: Self-pay | Source: Ambulatory Visit | Attending: Urology

## 2015-01-24 DIAGNOSIS — N320 Bladder-neck obstruction: Principal | ICD-10-CM | POA: Diagnosis present

## 2015-01-24 DIAGNOSIS — Z86010 Personal history of colonic polyps: Secondary | ICD-10-CM | POA: Insufficient documentation

## 2015-01-24 DIAGNOSIS — E782 Mixed hyperlipidemia: Secondary | ICD-10-CM | POA: Insufficient documentation

## 2015-01-24 DIAGNOSIS — M353 Polymyalgia rheumatica: Secondary | ICD-10-CM | POA: Diagnosis not present

## 2015-01-24 DIAGNOSIS — H9193 Unspecified hearing loss, bilateral: Secondary | ICD-10-CM | POA: Insufficient documentation

## 2015-01-24 DIAGNOSIS — I69354 Hemiplegia and hemiparesis following cerebral infarction affecting left non-dominant side: Secondary | ICD-10-CM | POA: Diagnosis not present

## 2015-01-24 DIAGNOSIS — Z87891 Personal history of nicotine dependence: Secondary | ICD-10-CM | POA: Insufficient documentation

## 2015-01-24 DIAGNOSIS — I10 Essential (primary) hypertension: Secondary | ICD-10-CM | POA: Insufficient documentation

## 2015-01-24 DIAGNOSIS — K219 Gastro-esophageal reflux disease without esophagitis: Secondary | ICD-10-CM | POA: Insufficient documentation

## 2015-01-24 DIAGNOSIS — J449 Chronic obstructive pulmonary disease, unspecified: Secondary | ICD-10-CM | POA: Diagnosis not present

## 2015-01-24 DIAGNOSIS — F329 Major depressive disorder, single episode, unspecified: Secondary | ICD-10-CM | POA: Diagnosis not present

## 2015-01-24 HISTORY — PX: TRANSURETHRAL INCISION OF BLADDER NECK: SHX6152

## 2015-01-24 LAB — HEPATIC FUNCTION PANEL
ALK PHOS: 73 U/L (ref 38–126)
ALT: 15 U/L — ABNORMAL LOW (ref 17–63)
AST: 19 U/L (ref 15–41)
Albumin: 3.9 g/dL (ref 3.5–5.0)
BILIRUBIN TOTAL: 0.3 mg/dL (ref 0.3–1.2)
Bilirubin, Direct: <0.1 mg/dL — ABNORMAL LOW (ref 0.1–0.5)
Total Protein: 6.8 g/dL (ref 6.5–8.1)

## 2015-01-24 LAB — BASIC METABOLIC PANEL
Anion gap: 9 (ref 5–15)
BUN: 26 mg/dL — AB (ref 6–20)
CHLORIDE: 109 mmol/L (ref 101–111)
CO2: 23 mmol/L (ref 22–32)
Calcium: 8.7 mg/dL — ABNORMAL LOW (ref 8.9–10.3)
Creatinine, Ser: 1.24 mg/dL (ref 0.61–1.24)
GFR calc Af Amer: >60 mL/min (ref >60)
GFR calc non Af Amer: 54 mL/min — ABNORMAL LOW (ref >60)
Glucose, Bld: 114 mg/dL — ABNORMAL HIGH (ref 65–99)
Potassium: 4.7 mmol/L (ref 3.5–5.1)
Sodium: 141 mmol/L (ref 135–145)

## 2015-01-24 SURGERY — INCISION, BLADDER NECK, TRANSURETHRAL
Anesthesia: General | Site: Bladder

## 2015-01-24 MED ORDER — FAMOTIDINE 10 MG PO TABS
10.0000 mg | ORAL_TABLET | Freq: Every day | ORAL | Status: DC
  Administered 2015-01-24 – 2015-01-25 (×2): 10 mg via ORAL
  Filled 2015-01-24 (×2): qty 1

## 2015-01-24 MED ORDER — ACETAMINOPHEN 325 MG PO TABS: 650.0000 mg | ORAL_TABLET | ORAL | Status: DC | PRN

## 2015-01-24 MED ORDER — ONDANSETRON HCL 4 MG/2ML IJ SOLN
INTRAMUSCULAR | Status: AC
  Filled 2015-01-24: qty 2

## 2015-01-24 MED ORDER — SODIUM CHLORIDE 0.9 % IR SOLN
Status: DC | PRN
  Administered 2015-01-24: 6000 mL via INTRAVESICAL

## 2015-01-24 MED ORDER — FENTANYL CITRATE (PF) 100 MCG/2ML IJ SOLN
INTRAMUSCULAR | Status: DC | PRN
  Administered 2015-01-24 (×3): 50 ug via INTRAVENOUS

## 2015-01-24 MED ORDER — VENLAFAXINE HCL ER 150 MG PO CP24
150.0000 mg | ORAL_CAPSULE | Freq: Every morning | ORAL | Status: DC
  Administered 2015-01-25: 150 mg via ORAL
  Filled 2015-01-24: qty 1

## 2015-01-24 MED ORDER — GENTAMICIN SULFATE 40 MG/ML IJ SOLN
5.0000 mg/kg | Freq: Once | INTRAVENOUS | Status: AC
  Administered 2015-01-24: 430 mg via INTRAVENOUS
  Filled 2015-01-24: qty 10.75

## 2015-01-24 MED ORDER — FUROSEMIDE 20 MG PO TABS
20.0000 mg | ORAL_TABLET | Freq: Every morning | ORAL | Status: DC
  Administered 2015-01-24 – 2015-01-25 (×2): 20 mg via ORAL
  Filled 2015-01-24 (×2): qty 1

## 2015-01-24 MED ORDER — LACTATED RINGERS IV SOLN
INTRAVENOUS | Status: DC
  Administered 2015-01-24: 08:00:00 via INTRAVENOUS

## 2015-01-24 MED ORDER — LISINOPRIL 20 MG PO TABS
20.0000 mg | ORAL_TABLET | Freq: Every morning | ORAL | Status: DC
  Administered 2015-01-24 – 2015-01-25 (×2): 20 mg via ORAL
  Filled 2015-01-24 (×2): qty 1

## 2015-01-24 MED ORDER — POLYETHYLENE GLYCOL 3350 17 GM/SCOOP PO POWD
17.0000 g | Freq: Every morning | ORAL | Status: DC
  Filled 2015-01-24: qty 255

## 2015-01-24 MED ORDER — FENTANYL CITRATE (PF) 100 MCG/2ML IJ SOLN
25.0000 ug | INTRAMUSCULAR | Status: DC | PRN
  Administered 2015-01-24 (×2): 25 ug via INTRAVENOUS

## 2015-01-24 MED ORDER — PHENYTOIN SODIUM EXTENDED 100 MG PO CAPS
100.0000 mg | ORAL_CAPSULE | Freq: Every day | ORAL | Status: DC
  Administered 2015-01-24: 100 mg via ORAL
  Filled 2015-01-24 (×2): qty 1

## 2015-01-24 MED ORDER — ONDANSETRON HCL 4 MG/2ML IJ SOLN: 4.0000 mg | INTRAMUSCULAR | Status: DC | PRN

## 2015-01-24 MED ORDER — CEFAZOLIN SODIUM-DEXTROSE 2-3 GM-% IV SOLR
2.0000 g | Freq: Three times a day (TID) | INTRAVENOUS | Status: AC
  Administered 2015-01-24 – 2015-01-25 (×2): 2 g via INTRAVENOUS
  Filled 2015-01-24 (×4): qty 50

## 2015-01-24 MED ORDER — PROPOFOL 10 MG/ML IV BOLUS
INTRAVENOUS | Status: DC | PRN
  Administered 2015-01-24: 110 mg via INTRAVENOUS

## 2015-01-24 MED ORDER — LIDOCAINE HCL (CARDIAC) 20 MG/ML IV SOLN
INTRAVENOUS | Status: AC
  Filled 2015-01-24: qty 5

## 2015-01-24 MED ORDER — LIDOCAINE HCL 1 % IJ SOLN
INTRAMUSCULAR | Status: DC | PRN
  Administered 2015-01-24: 50 mg via INTRADERMAL

## 2015-01-24 MED ORDER — DOCUSATE SODIUM 100 MG PO CAPS
100.0000 mg | ORAL_CAPSULE | Freq: Two times a day (BID) | ORAL | Status: DC
  Administered 2015-01-24 – 2015-01-25 (×2): 100 mg via ORAL
  Filled 2015-01-24 (×3): qty 1

## 2015-01-24 MED ORDER — OXYBUTYNIN CHLORIDE 5 MG PO TABS: 5.0000 mg | ORAL_TABLET | Freq: Three times a day (TID) | ORAL | Status: DC | PRN

## 2015-01-24 MED ORDER — CEFAZOLIN SODIUM 1 G IJ SOLR: 2.0000 g | Freq: Three times a day (TID) | INTRAMUSCULAR | Status: DC

## 2015-01-24 MED ORDER — METOPROLOL SUCCINATE ER 50 MG PO TB24
50.0000 mg | ORAL_TABLET | Freq: Every morning | ORAL | Status: DC
  Administered 2015-01-25: 50 mg via ORAL
  Filled 2015-01-24: qty 1

## 2015-01-24 MED ORDER — CEFAZOLIN SODIUM-DEXTROSE 2-3 GM-% IV SOLR
2.0000 g | INTRAVENOUS | Status: AC
  Administered 2015-01-24: 2 g via INTRAVENOUS

## 2015-01-24 MED ORDER — HYDROCODONE-ACETAMINOPHEN 5-325 MG PO TABS
1.0000 | ORAL_TABLET | ORAL | Status: DC | PRN
  Administered 2015-01-24 – 2015-01-25 (×2): 2 via ORAL
  Filled 2015-01-24 (×2): qty 2

## 2015-01-24 MED ORDER — FENTANYL CITRATE (PF) 100 MCG/2ML IJ SOLN
INTRAMUSCULAR | Status: AC
  Filled 2015-01-24: qty 2

## 2015-01-24 MED ORDER — ONDANSETRON HCL 4 MG/2ML IJ SOLN
INTRAMUSCULAR | Status: DC | PRN
Start: 2015-01-24 — End: 2015-01-24
  Administered 2015-01-24: 4 mg via INTRAVENOUS

## 2015-01-24 MED ORDER — CEFAZOLIN SODIUM-DEXTROSE 2-3 GM-% IV SOLR
INTRAVENOUS | Status: AC
  Filled 2015-01-24: qty 50

## 2015-01-24 MED ORDER — ALPRAZOLAM 0.25 MG PO TABS
0.2500 mg | ORAL_TABLET | Freq: Three times a day (TID) | ORAL | Status: DC | PRN
Start: 2015-01-24 — End: 2015-01-25
  Administered 2015-01-24: 0.25 mg via ORAL
  Filled 2015-01-24: qty 1

## 2015-01-24 MED ORDER — PHENYTOIN SODIUM EXTENDED 100 MG PO CAPS
200.0000 mg | ORAL_CAPSULE | Freq: Every day | ORAL | Status: DC
  Administered 2015-01-25: 200 mg via ORAL
  Filled 2015-01-24: qty 2

## 2015-01-24 MED ORDER — SIMVASTATIN 10 MG PO TABS
10.0000 mg | ORAL_TABLET | Freq: Every day | ORAL | Status: DC
  Administered 2015-01-24: 10 mg via ORAL
  Filled 2015-01-24 (×2): qty 1

## 2015-01-24 MED ORDER — POLYETHYLENE GLYCOL 3350 17 G PO PACK
17.0000 g | PACK | Freq: Every day | ORAL | Status: DC
  Administered 2015-01-25: 17 g via ORAL
  Filled 2015-01-24: qty 1

## 2015-01-24 MED ORDER — SODIUM CHLORIDE 0.45 % IV SOLN
INTRAVENOUS | Status: DC
  Administered 2015-01-24: 12:00:00 via INTRAVENOUS

## 2015-01-24 MED ORDER — PROPOFOL 10 MG/ML IV BOLUS
INTRAVENOUS | Status: AC
  Filled 2015-01-24: qty 20

## 2015-01-24 SURGICAL SUPPLY — 24 items
BAG URINE DRAINAGE (UROLOGICAL SUPPLIES) ×3 IMPLANT
BAG URO CATCHER STRL LF (DRAPE) ×3 IMPLANT
CATH FOLEY 2WAY 5CC 20FR (CATHETERS) ×3 IMPLANT
DRAPE CAMERA CLOSED 9X96 (DRAPES) ×3 IMPLANT
ELECT HOOK LOOP BIPOLAR (NEEDLE) ×3 IMPLANT
ELECT REM PT RETURN 9FT ADLT (ELECTROSURGICAL) ×3
ELECTRODE REM PT RTRN 9FT ADLT (ELECTROSURGICAL) ×1 IMPLANT
EVACUATOR MICROVAS BLADDER (UROLOGICAL SUPPLIES) IMPLANT
GLOVE BIO SURGEON STRL SZ 6.5 (GLOVE) ×2 IMPLANT
GLOVE BIO SURGEON STRL SZ7 (GLOVE) ×3 IMPLANT
GLOVE BIO SURGEONS STRL SZ 6.5 (GLOVE) ×1
GLOVE BIOGEL M 8.0 STRL (GLOVE) ×3 IMPLANT
GLOVE BIOGEL PI IND STRL 7.0 (GLOVE) ×1 IMPLANT
GLOVE BIOGEL PI INDICATOR 7.0 (GLOVE) ×2
GOWN STRL REUS W/ TWL XL LVL3 (GOWN DISPOSABLE) ×1 IMPLANT
GOWN STRL REUS W/TWL XL LVL3 (GOWN DISPOSABLE) ×6 IMPLANT
KIT ASPIRATION TUBING (SET/KITS/TRAYS/PACK) IMPLANT
LOOP CUT BIPOLAR 24F LRG (ELECTROSURGICAL) IMPLANT
MANIFOLD NEPTUNE II (INSTRUMENTS) ×3 IMPLANT
PACK CYSTO (CUSTOM PROCEDURE TRAY) ×3 IMPLANT
SYRINGE IRR TOOMEY STRL 70CC (SYRINGE) IMPLANT
TUBING CONNECTING 10 (TUBING) ×2 IMPLANT
TUBING CONNECTING 10' (TUBING) ×1
WATER STERILE IRR 3000ML UROMA (IV SOLUTION) ×3 IMPLANT

## 2015-01-24 NOTE — Anesthesia Preprocedure Evaluation (Signed)
Anesthesia Evaluation  Patient identified by MRN, date of birth, ID band Patient awake    Reviewed: Allergy & Precautions, H&P , NPO status , Patient's Chart, lab work & pertinent test results  Airway Mallampati: II  TM Distance: >3 FB Neck ROM: Full    Dental  (+) Teeth Intact, Poor Dentition, Missing, Chipped, Dental Advisory Given,    Pulmonary COPDformer smoker,  breath sounds clear to auscultation  Pulmonary exam normal       Cardiovascular hypertension, Pt. on home beta blockers and Pt. on medications Normal cardiovascular examRhythm:Regular Rate:Normal     Neuro/Psych Seizures -,  Depression  Neuromuscular disease CVA, Residual Symptoms negative psych ROS   GI/Hepatic negative GI ROS, Neg liver ROS, GERD-  ,(+) Hepatitis -, Unspecified  Endo/Other  negative endocrine ROS  Renal/GU negative Renal ROS  negative genitourinary   Musculoskeletal negative musculoskeletal ROS (+)   Abdominal   Peds  Hematology negative hematology ROS (+) anemia , hgb 10.4   Anesthesia Other Findings   Reproductive/Obstetrics                             Anesthesia Physical Anesthesia Plan  ASA: III  Anesthesia Plan: General   Post-op Pain Management:    Induction: Intravenous  Airway Management Planned: LMA  Additional Equipment:   Intra-op Plan:   Post-operative Plan:   Informed Consent:   Plan Discussed with: Surgeon  Anesthesia Plan Comments:         Anesthesia Quick Evaluation

## 2015-01-24 NOTE — Progress Notes (Signed)
Post-op note  Subjective: The patient is doing well.  No complaints. He is not having any bladder discomfort.  Objective: Vital signs in last 24 hours: Temp:  [97.8 F (36.6 C)-98.1 F (36.7 C)] 97.8 F (36.6 C) (05/19 1245) Pulse Rate:  [60-83] 83 (05/19 1245) Resp:  [14-20] 20 (05/19 1245) BP: (117-153)/(71-85) 117/71 mmHg (05/19 1245) SpO2:  [99 %-100 %] 100 % (05/19 1245) Weight:  [215 lb (97.523 kg)] 215 lb (97.523 kg) (05/19 0705)  Intake/Output from previous day:   Intake/Output this shift: Total I/O In: 900 [I.V.:900] Out: 400 [Urine:400]  Physical Exam:  General: Alert and oriented.   Urine is clears, slightly pink  Lab Results: No results for input(s): HGB, HCT in the last 72 hours.  Assessment/Plan: POD#0   1) Continue to monitor  2) catheter out in the morning for voiding trial   Lillette Boxer. Diona Fanti, MD     Franchot Gallo M 01/24/2015, 5:37 PM

## 2015-01-24 NOTE — Transfer of Care (Signed)
Immediate Anesthesia Transfer of Care Note  Patient: Jimmy Franklin  Procedure(s) Performed: Procedure(s) with comments: TRANSURETHRAL INCISION OF BLADDER NECK (N/A) - **TRANSURETHRAL RESECTION BLADDER NECK CONTRACTURE**     Patient Location: PACU  Anesthesia Type:General  Level of Consciousness: awake, oriented and patient cooperative  Airway & Oxygen Therapy: Patient Spontanous Breathing and Patient connected to face mask oxygen  Post-op Assessment: Report given to RN and Post -op Vital signs reviewed and stable  Post vital signs: Reviewed and stable  Last Vitals:  Filed Vitals:   01/24/15 0701  BP: 146/73  Pulse: 60  Temp: 36.7 C  Resp: 18    Complications: No apparent anesthesia complications

## 2015-01-24 NOTE — Anesthesia Procedure Notes (Signed)
Procedure Name: LMA Insertion Date/Time: 01/24/2015 9:11 AM Performed by: Sherian Maroon A Pre-anesthesia Checklist: Patient identified, Emergency Drugs available, Suction available, Patient being monitored and Timeout performed Patient Re-evaluated:Patient Re-evaluated prior to inductionOxygen Delivery Method: Circle system utilized Preoxygenation: Pre-oxygenation with 100% oxygen Intubation Type: IV induction Ventilation: Mask ventilation without difficulty LMA: LMA inserted LMA Size: 4.0 Grade View: Grade I Number of attempts: 1 Placement Confirmation: positive ETCO2 and breath sounds checked- equal and bilateral Dental Injury: Teeth and Oropharynx as per pre-operative assessment

## 2015-01-24 NOTE — Op Note (Signed)
Preoperative diagnosis:Bladder neck contracture  Postoperative diagnosis:Same   Procedure:TUR-BNC  Surgeon: Lillette Boxer. Khamya Topp, M.D.   Anesthesia: Gen.   Complications:None  Specimen(s):None  Drain(s):20 Fr coude tip catheter  Indications: This elderly male has a bladder neck contracture which is significantly symptomatic. He has a large post void residual urine volume, and has had a difficult catheter placement more recently. He was seen recently by my partners for retention, and had cystoscopic placement of the catheter in the office. That was recently not draining well, and had to be removed. The patient declined placement. He presents at this time for surgical management of a bladder neck contracture.   Technique and findings: The patient was properly identified in the holding area. He received preoperative IV antibiotics. He was taken the operating room where general anesthetic was administered with the LMA. He was placed in the dorsolithotomy position. Genitalia and perineum were prepped and draped. Proper timeout was then performed.  A 21 French panendoscope was advanced under direct vision through his urethra. There was a false passage in his bulbous urethra. The bladder neck was very high, and very difficult to pass the scope over. This had to be done with the scope torqued down posteriorly with the tip anteriorly. The bladder was finally entered over top of the bladder neck contracture. The bladder was inspected. There were moderate trabeculations. No tumors or foreign bodies were noted. Ureteral orifices were normal. Once full inspection was performed, the cystoscope was removed. I then passed a resectoscope using the obturator into the bladder, blindly using the obturator, again torquing the scope posteriorly. Once the bladder was entered, I passed the resectoscope with the [facility] knife. Once an adequate position with the Collins knife at the 6:00 position, the bladder neck was  incised from the inferior trigone all the way down to the area of the verumontanum. This opened up the ladder neck significantly. Resection was made into the capsular fibers. I then inspected the bladder neck anteriorly and laterally. There was a slight cicatrix in the anterior position was was again opened with the [employer]. I then used the [employer] to cauterize small bleeders. Inspection with the cystoscope, after the resectoscope had been removed revealed adequate hemostasis. The bladder neck was wide open, and there was no high riding bladder neck remaining. I then removed the cystoscope. A 20 French coud-tip catheter was then placed without difficulty. This was hooked to dependent drainage.  At this point, the procedure was terminated. The patient was awakened and taken to PACU in stable condition.

## 2015-01-24 NOTE — Anesthesia Postprocedure Evaluation (Signed)
  Anesthesia Post-op Note  Patient: Jimmy Franklin  Procedure(s) Performed: Procedure(s) (LRB): TRANSURETHRAL INCISION OF BLADDER NECK (N/A)  Patient Location: PACU  Anesthesia Type: General  Level of Consciousness: awake and alert   Airway and Oxygen Therapy: Patient Spontanous Breathing  Post-op Pain: mild  Post-op Assessment: Post-op Vital signs reviewed, Patient's Cardiovascular Status Stable, Respiratory Function Stable, Patent Airway and No signs of Nausea or vomiting  Last Vitals:  Filed Vitals:   01/24/15 1015  BP: 140/84  Pulse: 77  Temp:   Resp: 15    Post-op Vital Signs: stable   Complications: No apparent anesthesia complications

## 2015-01-24 NOTE — Progress Notes (Signed)
ANTIBIOTIC CONSULT NOTE - INITIAL  Pharmacy Consult for Gentamicin Indication: Surgical prophylaxis  No Known Allergies  Patient Measurements: Height: 6\' 1"  (185.4 cm) Weight: 215 lb (97.523 kg) IBW/kg (Calculated) : 79.9  Vital Signs: Temp: 97.8 F (36.6 C) (05/19 1245) Temp Source: Oral (05/19 1245) BP: 117/71 mmHg (05/19 1245) Pulse Rate: 83 (05/19 1245) Intake/Output from previous day:   Intake/Output from this shift: Total I/O In: 900 [I.V.:900] Out: 400 [Urine:400]  Labs:  Recent Labs  01/24/15 0712  CREATININE 1.24   Estimated Creatinine Clearance: 59.4 mL/min (by C-G formula based on Cr of 1.24). No results for input(s): VANCOTROUGH, VANCOPEAK, VANCORANDOM, GENTTROUGH, GENTPEAK, GENTRANDOM, TOBRATROUGH, TOBRAPEAK, TOBRARND, AMIKACINPEAK, AMIKACINTROU, AMIKACIN in the last 72 hours.   Microbiology: No results found for this or any previous visit (from the past 720 hour(s)).  Medical History: Past Medical History  Diagnosis Date  . Bowel habit changes 03/21/2011  . Hx of colonic polyps   . Chronic constipation 08  . Mixed hyperlipidemia   . Unspecified essential hypertension   . Polymyalgia rheumatica   . Stroke     CVA- 3 yrs ago-left sided weakness, mild residual  . GERD (gastroesophageal reflux disease)   . Foley catheter in place 03-17-13  . COPD (chronic obstructive pulmonary disease)   . Hearing loss of both ears   . Depression   . Seizures     caused by UTI-none recent- over 1 yr  . Hepatitis     20 yrs ago    Medications:  Anti-infectives    Start     Dose/Rate Route Frequency Ordered Stop   01/24/15 1800  ceFAZolin (ANCEF) IVPB 2 g/50 mL premix     2 g 100 mL/hr over 30 Minutes Intravenous Every 8 hours 01/24/15 1018 01/25/15 0959   01/24/15 1400  ceFAZolin (ANCEF) injection 2 g  Status:  Discontinued     2 g Intramuscular 3 times per day 01/24/15 1005 01/24/15 1017   01/24/15 0655  ceFAZolin (ANCEF) IVPB 2 g/50 mL premix     2 g 100  mL/hr over 30 Minutes Intravenous 30 min pre-op 01/24/15 0655 01/24/15 0913     Assessment: 5 yoM admitted on 5/19 for transurethral resection of bladder neck contracture.  Pharmacy is consulted post-op to dose prophylactic Gentamicin.  5/19 >> Cefazolin pre-op and post-op x2 doses 5/19 >> Gent x1 dose post-op    Today, 01/24/2015: Tmax: afebrile WBC: 12.1 (last on 5/11) Renal: SCr 1.24, CrCl ~ 60 ml/min No cultures  Goal of Therapy:  Gentamicin trough level <2 mcg/ml  Plan:   Gentamicin 430mg  IV once (5 mg/kg, adjusted weight 86.9 kg)  Follow up renal function, clinical course.  If further prophylactic dosing beyond 24 hour post-op period is required, MD is to re-consult pharmacy.    Gretta Arab PharmD, BCPS Pager [phone] 01/24/2015 2:27 PM

## 2015-01-24 NOTE — Interval H&P Note (Signed)
Pt seen/procedure discussed. He'll stay overnight after procedure.

## 2015-01-24 NOTE — Discharge Instructions (Signed)

## 2015-01-25 ENCOUNTER — Encounter (HOSPITAL_COMMUNITY): Payer: Self-pay | Admitting: Urology

## 2015-01-25 DIAGNOSIS — M353 Polymyalgia rheumatica: Secondary | ICD-10-CM | POA: Diagnosis not present

## 2015-01-25 DIAGNOSIS — K219 Gastro-esophageal reflux disease without esophagitis: Secondary | ICD-10-CM | POA: Diagnosis not present

## 2015-01-25 DIAGNOSIS — I10 Essential (primary) hypertension: Secondary | ICD-10-CM | POA: Diagnosis not present

## 2015-01-25 DIAGNOSIS — I69354 Hemiplegia and hemiparesis following cerebral infarction affecting left non-dominant side: Secondary | ICD-10-CM | POA: Diagnosis not present

## 2015-01-25 DIAGNOSIS — E782 Mixed hyperlipidemia: Secondary | ICD-10-CM | POA: Diagnosis not present

## 2015-01-25 DIAGNOSIS — J449 Chronic obstructive pulmonary disease, unspecified: Secondary | ICD-10-CM | POA: Diagnosis not present

## 2015-01-25 DIAGNOSIS — F329 Major depressive disorder, single episode, unspecified: Secondary | ICD-10-CM | POA: Diagnosis not present

## 2015-01-25 DIAGNOSIS — H9193 Unspecified hearing loss, bilateral: Secondary | ICD-10-CM | POA: Diagnosis not present

## 2015-01-25 DIAGNOSIS — N320 Bladder-neck obstruction: Secondary | ICD-10-CM | POA: Diagnosis not present

## 2015-01-25 LAB — GLUCOSE, CAPILLARY: GLUCOSE-CAPILLARY: 135 mg/dL — AB (ref 65–99)

## 2015-01-25 MED ORDER — BETHANECHOL CHLORIDE 25 MG PO TABS: 25.0000 mg | ORAL_TABLET | Freq: Three times a day (TID) | ORAL | Status: DC

## 2015-01-25 MED ORDER — LIDOCAINE HCL 2 % EX GEL
1.0000 "application " | Freq: Once | CUTANEOUS | Status: AC
  Administered 2015-01-25: 1 via URETHRAL
  Filled 2015-01-25: qty 10

## 2015-01-25 MED ORDER — LIDOCAINE HCL 2 % EX GEL
1.0000 | Freq: Once | CUTANEOUS | Status: DC
Start: 2015-01-25 — End: 2015-01-25
  Filled 2015-01-25: qty 5

## 2015-01-25 MED ORDER — BETHANECHOL CHLORIDE 25 MG PO TABS
25.0000 mg | ORAL_TABLET | Freq: Three times a day (TID) | ORAL | Status: DC
  Administered 2015-01-25 (×2): 25 mg via ORAL
  Filled 2015-01-25 (×3): qty 1

## 2015-01-25 NOTE — Progress Notes (Signed)
Pt has not voided since catheter removal at 0630 this morning. Bladder scan showed less than 100cc. MD made aware and ordered to continue to monitor until he returned to check on pt. Callie Fielding RN

## 2015-01-25 NOTE — Progress Notes (Signed)
1 Day Post-Op Subjective: Patient reports that he is feeling well. No pain or fevers overnight. He has not voided yet.  Objective: Vital signs in last 24 hours: Temp:  [97.8 F (36.6 C)-98.3 F (36.8 C)] 98.3 F (36.8 C) (05/20 0606) Pulse Rate:  [77-96] 95 (05/20 0606) Resp:  [14-20] 20 (05/20 0606) BP: (96-153)/(50-85) 96/50 mmHg (05/20 0606) SpO2:  [97 %-100 %] 97 % (05/20 0606)  Intake/Output from previous day: 05/19 0701 - 05/20 0700 In: 1947.5 [P.O.:480; I.V.:1467.5] Out: 2535 [Urine:2535] Intake/Output this shift:    Physical Exam:  Constitutional: Vital signs reviewed. WD WN in NAD   Eyes: PERRL, No scleral icterus.   Cardiovascular: RRR Pulmonary/Chest: Normal effort Abdominal: Soft. Non-tender, non-distended, bowel sounds are normal, no masses, organomegaly, or guarding present.  Genitourinary: Extremities: No cyanosis or edema   Lab Results: No results for input(s): HGB, HCT in the last 72 hours. BMET  Recent Labs  01/24/15 0712  NA 141  K 4.7  CL 109  CO2 23  GLUCOSE 114*  BUN 26*  CREATININE 1.24  CALCIUM 8.7*   No results for input(s): LABPT, INR in the last 72 hours. No results for input(s): LABURIN in the last 72 hours. Results for orders placed or performed during the hospital encounter of 02/14/12  Urine culture     Status: None   Collection Time: 02/14/12  6:01 AM  Result Value Ref Range Status   Specimen Description URINE, CLEAN CATCH  Final   Special Requests NONE  Final   Culture  Setup Time [id number]  Final   Colony Count >=100,000 COLONIES/ML  Final   Culture   Final    STAPHYLOCOCCUS SPECIES (COAGULASE NEGATIVE) Note: RIFAMPIN AND GENTAMICIN SHOULD NOT BE USED AS SINGLE DRUGS FOR TREATMENT OF STAPH INFECTIONS.   Report Status 02/16/2012 FINAL  Final   Organism ID, Bacteria STAPHYLOCOCCUS SPECIES (COAGULASE NEGATIVE)  Final      Susceptibility   Staphylococcus species (coagulase negative) - MIC*    GENTAMICIN <=0.5 Sensitive      LEVOFLOXACIN >=8 Resistant     NITROFURANTOIN <=16 Sensitive     OXACILLIN 1 Resistant     PENICILLIN >=0.5 Resistant     RIFAMPIN <=0.5 Sensitive     VANCOMYCIN <=0.5 Sensitive     TETRACYCLINE <=1 Sensitive     * STAPHYLOCOCCUS SPECIES (COAGULASE NEGATIVE)    Studies/Results: No results found.  Assessment/Plan:   Postop day 1 TURP bladder neck contracture. With the catheter out, he has not voided yet    I will give him bethanechol. If he fails a voiding trial, will replace a coud-tip catheter and let him go home with outpatient voiding trial.     Jimmy Franklin 01/25/2015, 7:32 AM

## 2015-01-30 DIAGNOSIS — D500 Iron deficiency anemia secondary to blood loss (chronic): Secondary | ICD-10-CM | POA: Diagnosis not present

## 2015-02-05 ENCOUNTER — Ambulatory Visit (INDEPENDENT_AMBULATORY_CARE_PROVIDER_SITE_OTHER): Payer: Self-pay | Admitting: Urology

## 2015-02-05 DIAGNOSIS — N320 Bladder-neck obstruction: Secondary | ICD-10-CM

## 2015-02-11 NOTE — Discharge Summary (Signed)
Patient ID: Jimmy Franklin MRN: [id number] DOB/AGE: 12-11-1933 81 y.o.  Admit date: 01/24/2015 Discharge date: 02/11/2015  Primary Care Physician:  Pcp Not In System  Discharge Diagnoses:   Present on Admission:  . [place] (bladder neck contracture) Urinary retention Urinary tract infection  Consults:  None   Discharge Medications:   Medication List    STOP taking these medications        clopidogrel 75 MG tablet  Commonly known as:  PLAVIX     doxycycline 50 MG capsule  Commonly known as:  VIBRAMYCIN     trimethoprim 100 MG tablet  Commonly known as:  TRIMPEX      TAKE these medications        ALPRAZolam 0.25 MG tablet  Commonly known as:  XANAX  Take 0.25 mg by mouth every 6 (six) hours as needed. For anxiety     bethanechol 25 MG tablet  Commonly known as:  URECHOLINE  Take 1 tablet (25 mg total) by mouth 3 (three) times daily.     furosemide 20 MG tablet  Commonly known as:  LASIX  Take 20 mg by mouth every morning.     iron polysaccharides 150 MG capsule  Commonly known as:  NIFEREX  Take 150 mg by mouth daily.     lisinopril 20 MG tablet  Commonly known as:  PRINIVIL,ZESTRIL  Take 20 mg by mouth every morning.     metoprolol succinate 50 MG 24 hr tablet  Commonly known as:  TOPROL-XL  Take 50 mg by mouth every morning. Take with or immediately following a meal.     phenytoin 100 MG ER capsule  Commonly known as:  DILANTIN  Take 100-200 mg by mouth 2 (two) times daily. 2 in the morning and 1 in the evening     polyethylene glycol powder powder  Commonly known as:  GLYCOLAX/MIRALAX  Take 17 g by mouth every morning.     ranitidine 300 MG tablet  Commonly known as:  ZANTAC  Take 300 mg by mouth at bedtime.     simvastatin 10 MG tablet  Commonly known as:  ZOCOR  Take 10 mg by mouth at bedtime.     venlafaxine XR 150 MG 24 hr capsule  Commonly known as:  EFFEXOR-XR  Take 150 mg by mouth every morning.         Significant Diagnostic  Studies:  No results found.  Brief H and P: For complete details please refer to admission H and P, but in brief patient was admitted for TUR bladder neck contracture. He has had holmium laser ablation of his prostate as well as TURP in the past. He was recently found to have a significant bladder neck contracture and resultant urinary retention. He presents at this time for outpatient management of this.  Hospital Course:  Active Problems:   BNC (bladder neck contracture)  The patient tolerated his TUR bladder neck contracture well. He failed a voiding trial on the first postoperative day. A catheter was replaced. He was sent home with the catheter. He was afebrile and doing well otherwise. Day of Discharge BP 112/62 mmHg  Pulse 85  Temp(Src) 98.2 F (36.8 C) (Oral)  Resp 20  Ht 6\' 1"  (1.854 m)  Wt 97.523 kg (215 lb)  BMI 28.37 kg/m2  SpO2 97%  No results found for this or any previous visit (from the past 24 hour(s)).  Physical Exam: General: Alert and awake oriented x3 not in any acute distress. HEENT:  anicteric sclera, pupils reactive to light and accommodation CVS: S1-S2 clear no murmur rubs or gallops Chest: clear to auscultation bilaterally, no wheezing rales or rhonchi Abdomen: soft nontender, nondistended, normal bowel sounds, no organomegaly Extremities: no cyanosis, clubbing or edema noted bilaterally Neuro: Cranial nerves II-XII intact, no focal neurological deficits  Disposition:  Home  Diet:  No restrictions  Activity:  No restrictions   Disposition and Follow-up:     Discharge Instructions    Discharge patient    Complete by:  As directed               TESTS THAT NEED FOLLOW-UP  None  DISCHARGE FOLLOW-UP Follow-up Information    Follow up with Jorja Loa, MD On 02/05/2015.   Specialty:  Urology   Why:  1:45 pm in [place]   Contact information:   [place] [address] [phone]       Time spent on Discharge:   15 minutes  Signed: Jorja Loa 02/11/2015, 6:49 PM

## 2015-02-21 DIAGNOSIS — G40409 Other generalized epilepsy and epileptic syndromes, not intractable, without status epilepticus: Secondary | ICD-10-CM | POA: Diagnosis not present

## 2015-02-21 DIAGNOSIS — R413 Other amnesia: Secondary | ICD-10-CM | POA: Diagnosis not present

## 2015-02-21 DIAGNOSIS — R0789 Other chest pain: Secondary | ICD-10-CM | POA: Diagnosis not present

## 2015-02-21 DIAGNOSIS — K219 Gastro-esophageal reflux disease without esophagitis: Secondary | ICD-10-CM | POA: Diagnosis not present

## 2015-02-21 DIAGNOSIS — I69398 Other sequelae of cerebral infarction: Secondary | ICD-10-CM | POA: Diagnosis not present

## 2015-02-21 DIAGNOSIS — N400 Enlarged prostate without lower urinary tract symptoms: Secondary | ICD-10-CM | POA: Diagnosis not present

## 2015-02-21 DIAGNOSIS — F329 Major depressive disorder, single episode, unspecified: Secondary | ICD-10-CM | POA: Diagnosis not present

## 2015-02-21 DIAGNOSIS — I10 Essential (primary) hypertension: Secondary | ICD-10-CM | POA: Diagnosis not present

## 2015-02-21 DIAGNOSIS — D509 Iron deficiency anemia, unspecified: Secondary | ICD-10-CM | POA: Diagnosis not present

## 2015-02-21 DIAGNOSIS — R079 Chest pain, unspecified: Secondary | ICD-10-CM | POA: Diagnosis not present

## 2015-02-22 DIAGNOSIS — G40409 Other generalized epilepsy and epileptic syndromes, not intractable, without status epilepticus: Secondary | ICD-10-CM | POA: Diagnosis not present

## 2015-02-22 DIAGNOSIS — I209 Angina pectoris, unspecified: Secondary | ICD-10-CM | POA: Diagnosis not present

## 2015-02-22 DIAGNOSIS — R413 Other amnesia: Secondary | ICD-10-CM | POA: Diagnosis not present

## 2015-02-22 DIAGNOSIS — R079 Chest pain, unspecified: Secondary | ICD-10-CM | POA: Diagnosis not present

## 2015-02-22 DIAGNOSIS — F329 Major depressive disorder, single episode, unspecified: Secondary | ICD-10-CM | POA: Diagnosis not present

## 2015-02-22 DIAGNOSIS — D509 Iron deficiency anemia, unspecified: Secondary | ICD-10-CM | POA: Diagnosis not present

## 2015-02-22 DIAGNOSIS — K219 Gastro-esophageal reflux disease without esophagitis: Secondary | ICD-10-CM | POA: Diagnosis not present

## 2015-02-22 DIAGNOSIS — I10 Essential (primary) hypertension: Secondary | ICD-10-CM | POA: Diagnosis not present

## 2015-02-22 DIAGNOSIS — I69398 Other sequelae of cerebral infarction: Secondary | ICD-10-CM | POA: Diagnosis not present

## 2015-02-22 DIAGNOSIS — N400 Enlarged prostate without lower urinary tract symptoms: Secondary | ICD-10-CM | POA: Diagnosis not present

## 2015-02-23 DIAGNOSIS — I249 Acute ischemic heart disease, unspecified: Secondary | ICD-10-CM | POA: Diagnosis not present

## 2015-03-04 DIAGNOSIS — Z8673 Personal history of transient ischemic attack (TIA), and cerebral infarction without residual deficits: Secondary | ICD-10-CM | POA: Diagnosis not present

## 2015-03-04 DIAGNOSIS — I209 Angina pectoris, unspecified: Secondary | ICD-10-CM | POA: Diagnosis not present

## 2015-03-04 DIAGNOSIS — D500 Iron deficiency anemia secondary to blood loss (chronic): Secondary | ICD-10-CM | POA: Diagnosis not present

## 2015-03-04 DIAGNOSIS — R0602 Shortness of breath: Secondary | ICD-10-CM | POA: Diagnosis not present

## 2015-03-07 DIAGNOSIS — I10 Essential (primary) hypertension: Secondary | ICD-10-CM | POA: Diagnosis not present

## 2015-03-07 DIAGNOSIS — I2510 Atherosclerotic heart disease of native coronary artery without angina pectoris: Secondary | ICD-10-CM | POA: Diagnosis not present

## 2015-03-07 DIAGNOSIS — D500 Iron deficiency anemia secondary to blood loss (chronic): Secondary | ICD-10-CM | POA: Diagnosis not present

## 2015-03-07 DIAGNOSIS — R0602 Shortness of breath: Secondary | ICD-10-CM | POA: Diagnosis not present

## 2015-03-07 DIAGNOSIS — Z7902 Long term (current) use of antithrombotics/antiplatelets: Secondary | ICD-10-CM | POA: Diagnosis not present

## 2015-03-07 DIAGNOSIS — Z87891 Personal history of nicotine dependence: Secondary | ICD-10-CM | POA: Diagnosis not present

## 2015-03-07 DIAGNOSIS — Z8673 Personal history of transient ischemic attack (TIA), and cerebral infarction without residual deficits: Secondary | ICD-10-CM | POA: Diagnosis not present

## 2015-03-08 DIAGNOSIS — Z8673 Personal history of transient ischemic attack (TIA), and cerebral infarction without residual deficits: Secondary | ICD-10-CM | POA: Diagnosis not present

## 2015-03-08 DIAGNOSIS — Z7902 Long term (current) use of antithrombotics/antiplatelets: Secondary | ICD-10-CM | POA: Diagnosis not present

## 2015-03-08 DIAGNOSIS — I2510 Atherosclerotic heart disease of native coronary artery without angina pectoris: Secondary | ICD-10-CM | POA: Diagnosis not present

## 2015-03-08 DIAGNOSIS — D500 Iron deficiency anemia secondary to blood loss (chronic): Secondary | ICD-10-CM | POA: Diagnosis not present

## 2015-03-08 DIAGNOSIS — Z87891 Personal history of nicotine dependence: Secondary | ICD-10-CM | POA: Diagnosis not present

## 2015-03-08 DIAGNOSIS — I10 Essential (primary) hypertension: Secondary | ICD-10-CM | POA: Diagnosis not present

## 2015-03-08 DIAGNOSIS — R0602 Shortness of breath: Secondary | ICD-10-CM | POA: Diagnosis not present

## 2015-03-12 ENCOUNTER — Ambulatory Visit: Payer: Commercial Managed Care - HMO | Admitting: Urology

## 2015-03-19 DIAGNOSIS — D649 Anemia, unspecified: Secondary | ICD-10-CM | POA: Diagnosis not present

## 2015-03-19 DIAGNOSIS — I2510 Atherosclerotic heart disease of native coronary artery without angina pectoris: Secondary | ICD-10-CM | POA: Diagnosis not present

## 2015-03-24 DIAGNOSIS — I200 Unstable angina: Secondary | ICD-10-CM | POA: Diagnosis not present

## 2015-03-25 DIAGNOSIS — Z9862 Peripheral vascular angioplasty status: Secondary | ICD-10-CM | POA: Diagnosis not present

## 2015-04-04 ENCOUNTER — Encounter (HOSPITAL_COMMUNITY): Payer: Medicare HMO

## 2015-05-01 DIAGNOSIS — E782 Mixed hyperlipidemia: Secondary | ICD-10-CM | POA: Diagnosis not present

## 2015-05-01 DIAGNOSIS — I10 Essential (primary) hypertension: Secondary | ICD-10-CM | POA: Diagnosis not present

## 2015-05-01 DIAGNOSIS — F331 Major depressive disorder, recurrent, moderate: Secondary | ICD-10-CM | POA: Diagnosis not present

## 2015-05-01 DIAGNOSIS — D500 Iron deficiency anemia secondary to blood loss (chronic): Secondary | ICD-10-CM | POA: Diagnosis not present

## 2015-05-07 DIAGNOSIS — Z23 Encounter for immunization: Secondary | ICD-10-CM | POA: Diagnosis not present

## 2015-05-07 DIAGNOSIS — F331 Major depressive disorder, recurrent, moderate: Secondary | ICD-10-CM | POA: Diagnosis not present

## 2015-05-07 DIAGNOSIS — D500 Iron deficiency anemia secondary to blood loss (chronic): Secondary | ICD-10-CM | POA: Diagnosis not present

## 2015-05-07 DIAGNOSIS — I10 Essential (primary) hypertension: Secondary | ICD-10-CM | POA: Diagnosis not present

## 2015-05-07 DIAGNOSIS — K5901 Slow transit constipation: Secondary | ICD-10-CM | POA: Diagnosis not present

## 2015-05-07 DIAGNOSIS — G8114 Spastic hemiplegia affecting left nondominant side: Secondary | ICD-10-CM | POA: Diagnosis not present

## 2015-05-07 DIAGNOSIS — E782 Mixed hyperlipidemia: Secondary | ICD-10-CM | POA: Diagnosis not present

## 2015-07-02 DIAGNOSIS — Z9862 Peripheral vascular angioplasty status: Secondary | ICD-10-CM | POA: Diagnosis not present

## 2015-09-11 DIAGNOSIS — D500 Iron deficiency anemia secondary to blood loss (chronic): Secondary | ICD-10-CM | POA: Diagnosis not present

## 2015-09-11 DIAGNOSIS — I10 Essential (primary) hypertension: Secondary | ICD-10-CM | POA: Diagnosis not present

## 2015-09-11 DIAGNOSIS — E782 Mixed hyperlipidemia: Secondary | ICD-10-CM | POA: Diagnosis not present

## 2015-09-19 DIAGNOSIS — I10 Essential (primary) hypertension: Secondary | ICD-10-CM | POA: Diagnosis not present

## 2015-09-19 DIAGNOSIS — D500 Iron deficiency anemia secondary to blood loss (chronic): Secondary | ICD-10-CM | POA: Diagnosis not present

## 2015-09-19 DIAGNOSIS — K5901 Slow transit constipation: Secondary | ICD-10-CM | POA: Diagnosis not present

## 2015-09-19 DIAGNOSIS — G8114 Spastic hemiplegia affecting left nondominant side: Secondary | ICD-10-CM | POA: Diagnosis not present

## 2015-09-19 DIAGNOSIS — F331 Major depressive disorder, recurrent, moderate: Secondary | ICD-10-CM | POA: Diagnosis not present

## 2015-09-19 DIAGNOSIS — E782 Mixed hyperlipidemia: Secondary | ICD-10-CM | POA: Diagnosis not present

## 2015-09-19 DIAGNOSIS — M545 Low back pain: Secondary | ICD-10-CM | POA: Diagnosis not present

## 2015-11-06 DIAGNOSIS — Z0100 Encounter for examination of eyes and vision without abnormal findings: Secondary | ICD-10-CM | POA: Diagnosis not present

## 2015-11-06 DIAGNOSIS — I10 Essential (primary) hypertension: Secondary | ICD-10-CM | POA: Diagnosis not present

## 2015-11-08 DIAGNOSIS — Z0100 Encounter for examination of eyes and vision without abnormal findings: Secondary | ICD-10-CM | POA: Diagnosis not present

## 2016-01-07 ENCOUNTER — Ambulatory Visit (INDEPENDENT_AMBULATORY_CARE_PROVIDER_SITE_OTHER): Payer: Commercial Managed Care - HMO | Admitting: Urology

## 2016-01-07 DIAGNOSIS — N401 Enlarged prostate with lower urinary tract symptoms: Secondary | ICD-10-CM | POA: Diagnosis not present

## 2016-01-07 DIAGNOSIS — N312 Flaccid neuropathic bladder, not elsewhere classified: Secondary | ICD-10-CM | POA: Diagnosis not present

## 2016-01-07 DIAGNOSIS — N320 Bladder-neck obstruction: Secondary | ICD-10-CM

## 2016-05-04 DIAGNOSIS — G8114 Spastic hemiplegia affecting left nondominant side: Secondary | ICD-10-CM | POA: Diagnosis not present

## 2016-05-04 DIAGNOSIS — E782 Mixed hyperlipidemia: Secondary | ICD-10-CM | POA: Diagnosis not present

## 2016-05-04 DIAGNOSIS — M545 Low back pain: Secondary | ICD-10-CM | POA: Diagnosis not present

## 2016-05-04 DIAGNOSIS — Z6832 Body mass index (BMI) 32.0-32.9, adult: Secondary | ICD-10-CM | POA: Diagnosis not present

## 2016-05-04 DIAGNOSIS — I10 Essential (primary) hypertension: Secondary | ICD-10-CM | POA: Diagnosis not present

## 2016-05-04 DIAGNOSIS — D500 Iron deficiency anemia secondary to blood loss (chronic): Secondary | ICD-10-CM | POA: Diagnosis not present

## 2016-05-04 DIAGNOSIS — F331 Major depressive disorder, recurrent, moderate: Secondary | ICD-10-CM | POA: Diagnosis not present

## 2016-05-04 DIAGNOSIS — K5901 Slow transit constipation: Secondary | ICD-10-CM | POA: Diagnosis not present

## 2016-05-06 DIAGNOSIS — R1312 Dysphagia, oropharyngeal phase: Secondary | ICD-10-CM | POA: Diagnosis not present

## 2016-05-12 DIAGNOSIS — Z23 Encounter for immunization: Secondary | ICD-10-CM | POA: Diagnosis not present

## 2016-06-16 DIAGNOSIS — D500 Iron deficiency anemia secondary to blood loss (chronic): Secondary | ICD-10-CM | POA: Diagnosis not present

## 2016-06-30 ENCOUNTER — Other Ambulatory Visit: Payer: Self-pay | Admitting: Urology

## 2016-06-30 DIAGNOSIS — N312 Flaccid neuropathic bladder, not elsewhere classified: Secondary | ICD-10-CM

## 2016-07-06 ENCOUNTER — Ambulatory Visit (HOSPITAL_COMMUNITY)
Admission: RE | Admit: 2016-07-06 | Discharge: 2016-07-06 | Disposition: A | Discharge status: Home / Self Care | Payer: Commercial Managed Care - HMO | Source: Ambulatory Visit | Attending: Urology | Admitting: Urology

## 2016-07-06 DIAGNOSIS — N281 Cyst of kidney, acquired: Secondary | ICD-10-CM | POA: Diagnosis not present

## 2016-07-06 DIAGNOSIS — N312 Flaccid neuropathic bladder, not elsewhere classified: Secondary | ICD-10-CM | POA: Diagnosis not present

## 2016-07-06 DIAGNOSIS — N323 Diverticulum of bladder: Secondary | ICD-10-CM | POA: Diagnosis not present

## 2016-07-06 DIAGNOSIS — K5730 Diverticulosis of large intestine without perforation or abscess without bleeding: Secondary | ICD-10-CM | POA: Diagnosis not present

## 2016-07-14 ENCOUNTER — Ambulatory Visit (INDEPENDENT_AMBULATORY_CARE_PROVIDER_SITE_OTHER): Payer: Commercial Managed Care - HMO | Admitting: Urology

## 2016-07-14 DIAGNOSIS — R339 Retention of urine, unspecified: Secondary | ICD-10-CM

## 2016-07-14 DIAGNOSIS — N401 Enlarged prostate with lower urinary tract symptoms: Secondary | ICD-10-CM

## 2016-07-14 DIAGNOSIS — N312 Flaccid neuropathic bladder, not elsewhere classified: Secondary | ICD-10-CM

## 2016-10-27 DIAGNOSIS — Z6832 Body mass index (BMI) 32.0-32.9, adult: Secondary | ICD-10-CM | POA: Insufficient documentation

## 2016-10-27 DIAGNOSIS — E782 Mixed hyperlipidemia: Secondary | ICD-10-CM | POA: Insufficient documentation

## 2016-10-27 DIAGNOSIS — K5901 Slow transit constipation: Secondary | ICD-10-CM | POA: Insufficient documentation

## 2016-11-07 DIAGNOSIS — G8929 Other chronic pain: Secondary | ICD-10-CM | POA: Insufficient documentation

## 2016-11-24 ENCOUNTER — Ambulatory Visit (INDEPENDENT_AMBULATORY_CARE_PROVIDER_SITE_OTHER): Payer: Medicare PPO | Admitting: Urology

## 2016-11-24 DIAGNOSIS — G8110 Spastic hemiplegia affecting unspecified side: Secondary | ICD-10-CM | POA: Insufficient documentation

## 2016-11-24 DIAGNOSIS — N401 Enlarged prostate with lower urinary tract symptoms: Secondary | ICD-10-CM

## 2016-11-24 DIAGNOSIS — F331 Major depressive disorder, recurrent, moderate: Secondary | ICD-10-CM | POA: Insufficient documentation

## 2016-11-27 IMAGING — CT CT ABD-PEL WO/W CM
3 of 11 series · 11 of 46 positions shown, 17 images · IV contrast (Omnipaque 300)
Comparison: CT abdomen 02/27/2008

CLINICAL DATA: Gross hematuria. Enlarged prostate gland. Difficulty
voiding. History of TURP.

EXAM:
CT ABDOMEN AND PELVIS WITHOUT AND WITH CONTRAST
TECHNIQUE: Multidetector CT imaging of the abdomen and pelvis was performed
following the standard protocol before and following the bolus
administration of intravenous contrast.
CONTRAST:  125mL OMNIPAQUE IOHEXOL 300 MG/ML  SOLN

[Series 3: hematuria mpr coro pre (id) · coronal · non-contrast · 0.87mm/px · 2 of 112 slices shown, 3 images]
[im 38/112  soft-tissue]
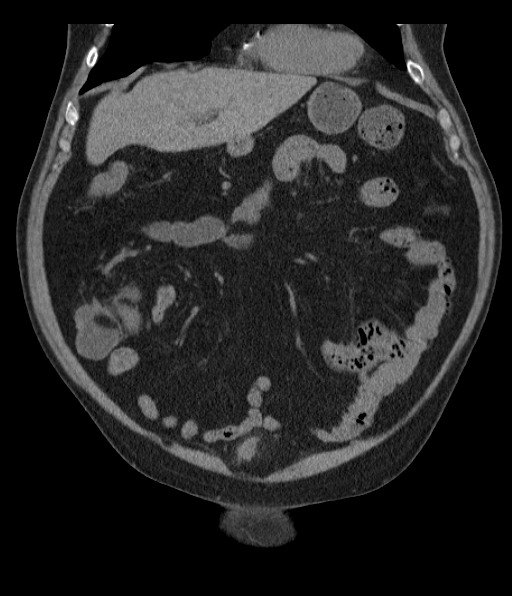
[im 38/112  bone]
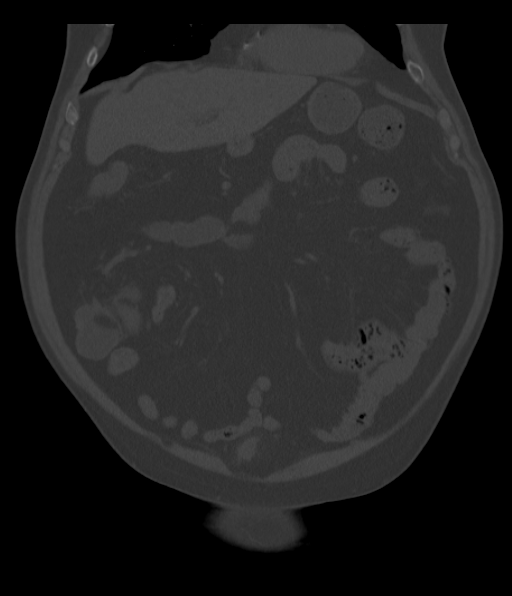
[im 75/112  soft-tissue]
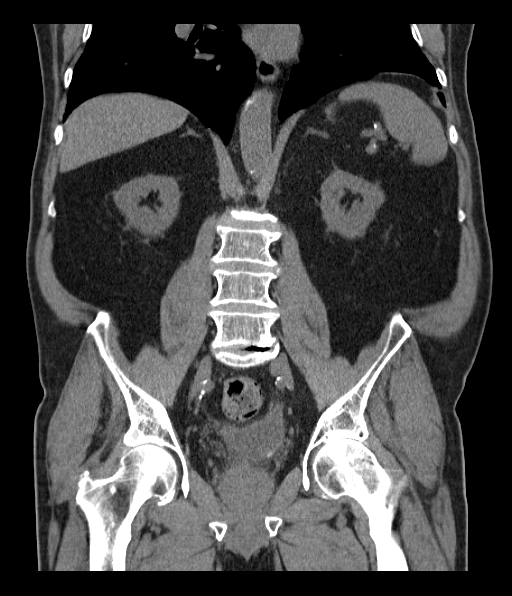

[Series 6: hematuria post contrast (id) · axial · 0.96mm/px · z∈[-445,-85]mm · 7 of 96 slices shown, 12 images]
[im 12/96  soft-tissue]
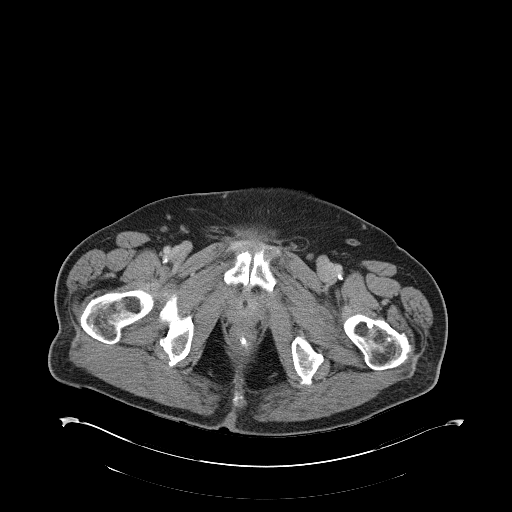
[im 12/96  bone]
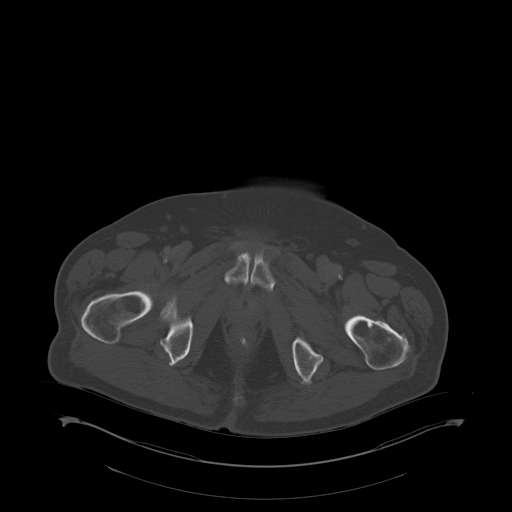
[im 24/96  soft-tissue]
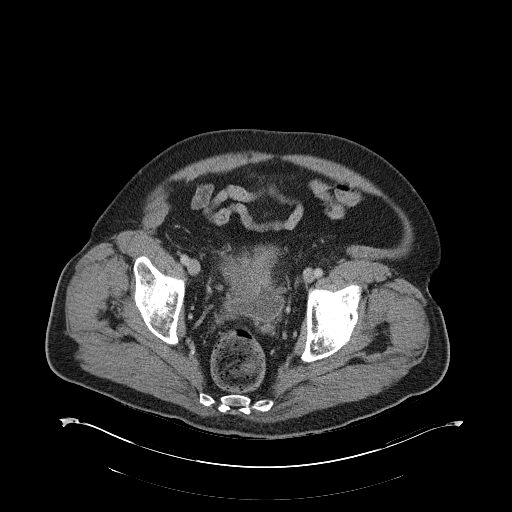
[im 36/96  soft-tissue]
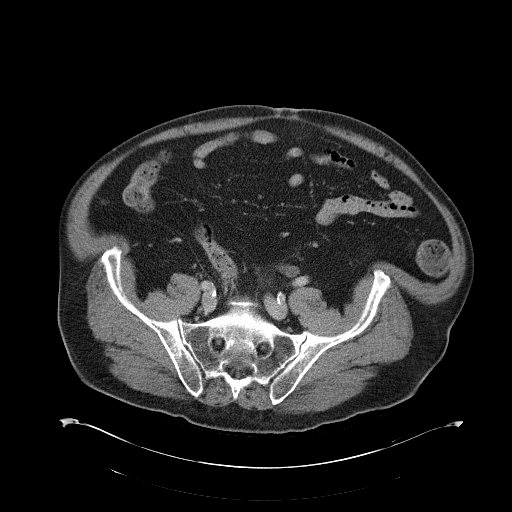
[im 48/96  soft-tissue]
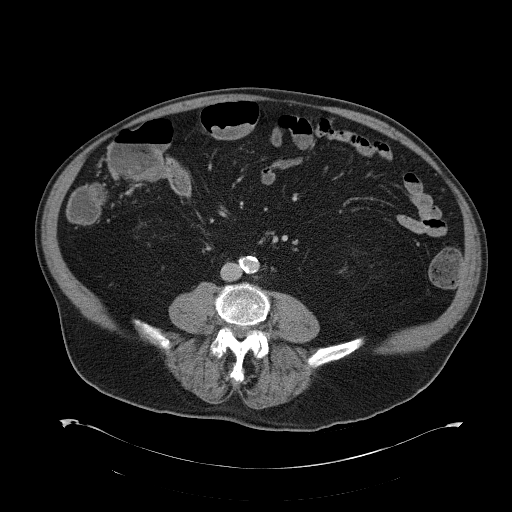
[im 48/96  lung]
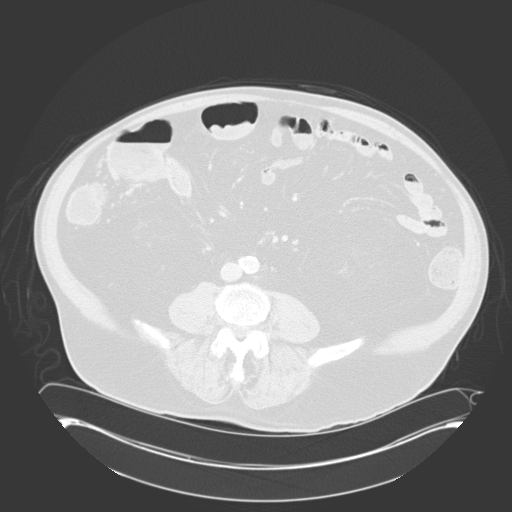
[im 60/96  soft-tissue]
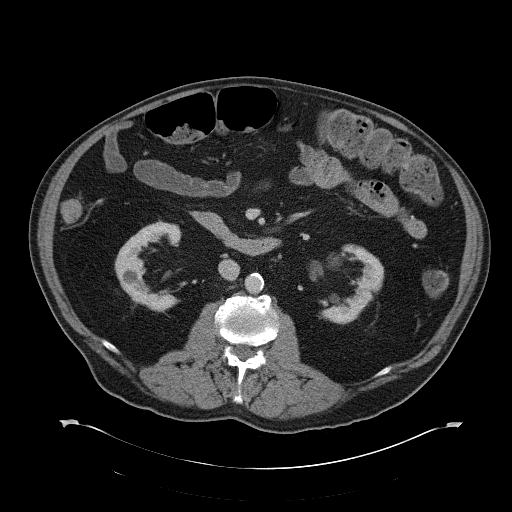
[im 60/96  lung]
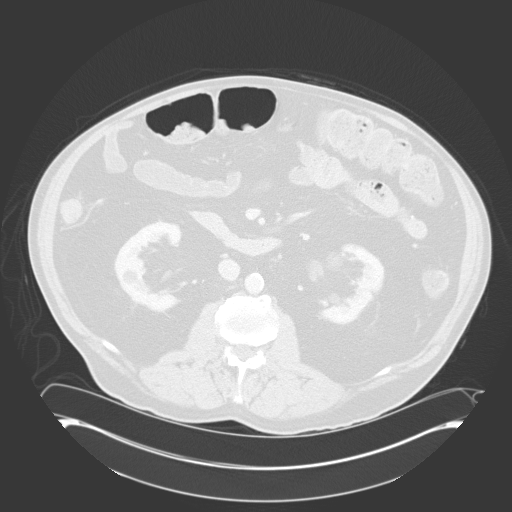
[im 72/96  soft-tissue]
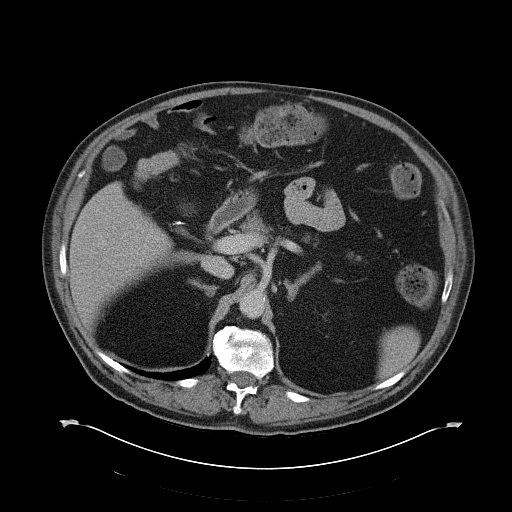
[im 72/96  lung]
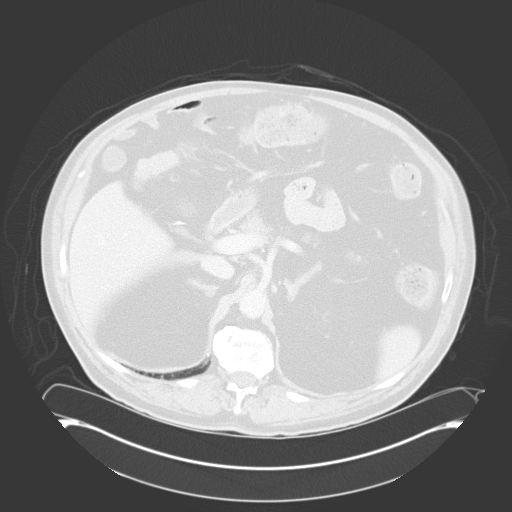
[im 84/96  soft-tissue]
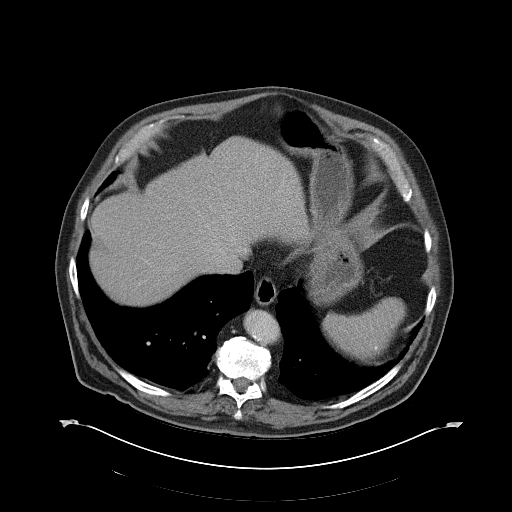
[im 84/96  lung]
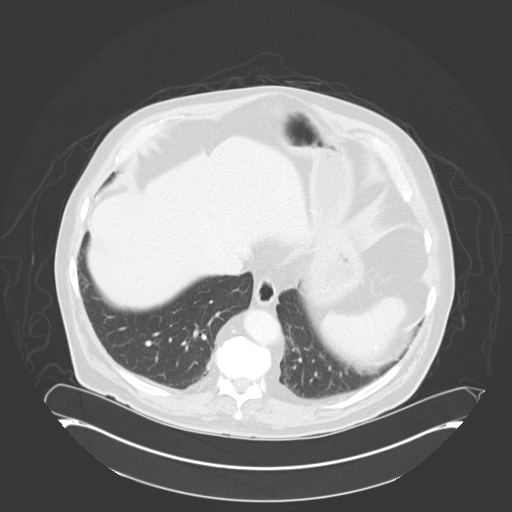

[Series 11: hematuria delay (id) · axial · delayed · 0.96mm/px · z∈[-445,-385]mm · 2 of 96 slices shown]
[im 12/96  soft-tissue]
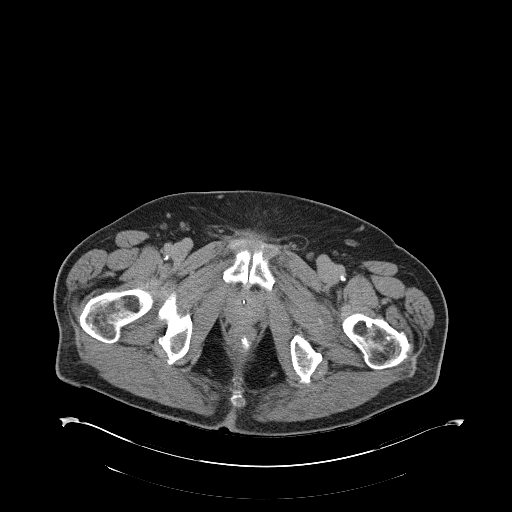
[im 24/96  soft-tissue]
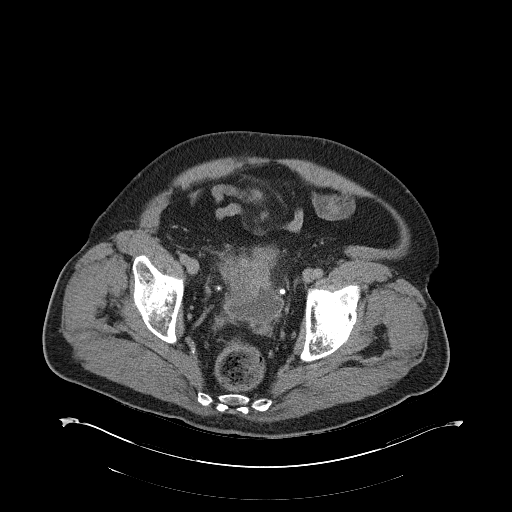

[11 of 46 positions shown; findings below may reference images not displayed]

FINDINGS: Lower chest: Lung bases are clear.

Hepatobiliary:  No focal hepatic lesion.  Post cholecystectomy.

Pancreas: Pancreas is normal. No ductal dilatation. No pancreatic
inflammation.

Spleen: Normal spleen

Adrenals/urinary tract: Adrenal glands are normal.

Non IV contrast images demonstrate no nephrolithiasis. The left
ureter is mildly dilated. There are peripelvic cysts on the left. No
ureterolithiasis evident.

Cortical phase imaging demonstrates no enhancing renal cortical
lesion. There is a nonenhancing cyst in the cortex of the right
kidney measuring 10 mm. Delayed pyelogram phase imaging demonstrates
no filling defects within collecting systems or ureters.

The bladder is collapsed around Foley catheter. No bladder calculi
are evident. The anterior portion of the the bladder is nodular and
irregular. There is a irregular nodular thickening along the dome of
the bladder is best seen on sagittal image 17. Otherwise the bladder
is collapsed and poorly evaluated. Superior to the prostate gland
and positioned between the collapsed bladder and seminal vesicles is
an irregular fluid collection measuring 5.2 x 6.2 x 7.6 cm with a
thin enhancing rim (image 69, series 11). On comparison exam 3664
within this lesion was more rounded and larger and felt to represent
a bladder diverticulum.

Stomach/Bowel: Stomach, small bowel, appendix, and cecum are normal.
The colon and rectosigmoid colon are normal.

Vascular/Lymphatic: Abdominal aorta is normal caliber with
atherosclerotic calcification. There is no retroperitoneal or
periportal lymphadenopathy. No pelvic lymphadenopathy.

Reproductive: Prostate gland is normal volume.

Musculoskeletal: No aggressive osseous lesion.

Other: No free fluid.
IMPRESSION: 1. No nephrolithiasis or ureterolithiasis.
2. Bilateral renal cortical thinning.
3. Mild dilatation of the left ureter likely relates to obstruction
at the level of the bladder.
4. Bladder is collapsed around the Foley catheter and poorly
evaluated. The anterior margin of the bladder is irregular nodular.
Differential would include bladder neoplasm versus potential
collapsed bladder diverticula. Consider cystoscopy for further
evaluation.
5. Large bladder diverticulum extending posterior to bladder and
superior to the prostate gland. There is mild enhancement of the
wall of the diverticulum suggesting potential cystitis.

## 2016-12-15 ENCOUNTER — Ambulatory Visit (INDEPENDENT_AMBULATORY_CARE_PROVIDER_SITE_OTHER): Payer: Medicare PPO | Admitting: Urology

## 2016-12-15 DIAGNOSIS — N401 Enlarged prostate with lower urinary tract symptoms: Secondary | ICD-10-CM | POA: Diagnosis not present

## 2016-12-15 DIAGNOSIS — R339 Retention of urine, unspecified: Secondary | ICD-10-CM

## 2016-12-15 DIAGNOSIS — N312 Flaccid neuropathic bladder, not elsewhere classified: Secondary | ICD-10-CM

## 2016-12-30 ENCOUNTER — Other Ambulatory Visit: Payer: Self-pay | Admitting: Urology

## 2017-01-13 ENCOUNTER — Other Ambulatory Visit: Payer: Self-pay | Admitting: *Deleted

## 2017-01-13 NOTE — Patient Outreach (Signed)
[facility]) Care Management  01/13/2017  JOHNEY PEROTTI 1934-02-26 [id number]   Referral from MD office; nursing & social evaluation & treatment ; patient with limited resources and support.  Telephone call attempt;  left HIPPA compliant voice mail requesting return call.   Plan: Follow up.  Sherrin Daisy, RN BSN [place] Management Coordinator [facility] Care Management  [phone]

## 2017-01-15 ENCOUNTER — Other Ambulatory Visit: Payer: Self-pay | Admitting: *Deleted

## 2017-01-15 NOTE — Patient Outreach (Signed)
[place] [facility]) Care Management  01/15/2017  Jimmy Franklin 02/11/1934 [id number]  Telephone call to patient; daughter states patient is not available but will have him return call.  Plan: Follow up. Screening call placed on schedule.  Sherrin Daisy, RN BSN [place] Management Coordinator [facility] Care Management  [phone]

## 2017-01-18 ENCOUNTER — Other Ambulatory Visit: Payer: Self-pay | Admitting: *Deleted

## 2017-01-18 NOTE — Patient Outreach (Signed)
[facility] [facility]) Care Management  01/18/2017  Jimmy Franklin 1934-08-06 [id number]  Telephone call attempt x 2 ; left HIPPA compliant voice mail requesting return call.   Plan: Follow up call.  Sherrin Daisy, RN BSN [place] Management Coordinator [facility] Care Management  [phone]

## 2017-01-20 ENCOUNTER — Other Ambulatory Visit: Payer: Self-pay | Admitting: *Deleted

## 2017-01-20 ENCOUNTER — Encounter: Payer: Self-pay | Admitting: *Deleted

## 2017-01-20 NOTE — Patient Outreach (Signed)
[facility]) Care Management  01/20/2017  Jimmy Franklin July 09, 1934 [id number]  Telephone call attempt x 3; left message on voice mail requesting return call.  Plan:  Send outreach letter to patient; Follow up with patient.  Sherrin Daisy, RN BSN [place] Management Coordinator [facility] Care Management  [phone]

## 2017-01-25 ENCOUNTER — Other Ambulatory Visit: Payer: Self-pay | Admitting: *Deleted

## 2017-01-25 NOTE — Patient Outreach (Signed)
[facility]) Care Management  01/25/2017  Jimmy Franklin 1934-01-05 [id number]  Referral from MD office for nursing & social evaluation:  Telephone call to patient who was advised of reason for call & [facility] care management services. Patient gave HIPPA verification & answered some of screening health questions. He requested that I speak with his daughter Jimmy Franklin regarding further questioning.  Daughter- Jimmy Franklin gave HIPPA verification. States that patient does have good & bad days with his memory.  States he has history of having stroke in 2012 and recently sustained head injury with brain bleed(subarachnoid hemorrhage) that did not require surgery in Feb 2018.  States he was in hospital in [place], [place] during episode with head injury (fell in [place], [place] where he was seen in local hospital emergency room, then transferred to [facility] where he stayed for 5 days).   Daughter/caregiver states she is Certified Nurses Aid and stays at home with  patient (father) and mother who she cares for. States patient uses cane in the home (when he remembers) and walker outside of home. States he has fallen 2 times in last 3 months (2nd fall in March 2018 only sustained bruises & scratches). States patient does have balance problem at times and she encourages him to use cane when at home. .   States other medical conditions include HTN (controlled), neurogenic bladder (wears indwelling catheter which is changed every 6 weeks in urology office-Dr. Diona Fanti).  States he has had seizure since discharge to home and is on currently on seizure medication. States she manages patient medications & makes sure he takes as prescribed by his doctors. States he gets medications from [employer] mail order & local pharmacy ([facility]) without problems.  Voices that takes patient to all of MD appointments.    Caregiver states she fixes patient's meals. States he has poor appetite & has lost 24 lbs since  Feb 2018. States does have some depression & is not able to do some of the activities that he use to do.  States currently not seeing a counselor but does attend primary care appointments.   Daughter states she is aware of community resources that are available if needed; such as community transportation.  Assessment: Patient with hx brain bleed(SAH) following fall in Feb 2018; hx stroke 2012 Diagnosed with HTN & is on medication  Falls risk -has fallen 2 times in last 3 months (balance off; using cane or walker) Has indwelling catheter (neurogenic bladder) Patient having some depression  Patient has support from daughter who lives and cares for pt. & spouse.    Daughter/caregiver consents to [facility] care coordinator services for health assessments.  Plan: Refer to care management assistant to assign to [facility] care coordinator for complex case management.  Sherrin Daisy, RN BSN [place] Management Coordinator [facility] Care Management  [phone]

## 2017-01-26 ENCOUNTER — Ambulatory Visit (INDEPENDENT_AMBULATORY_CARE_PROVIDER_SITE_OTHER): Payer: Medicare PPO | Admitting: Urology

## 2017-01-26 DIAGNOSIS — N312 Flaccid neuropathic bladder, not elsewhere classified: Secondary | ICD-10-CM | POA: Diagnosis not present

## 2017-01-29 ENCOUNTER — Other Ambulatory Visit: Payer: Self-pay | Admitting: *Deleted

## 2017-01-29 NOTE — Patient Outreach (Signed)
Telephone call to pt to schedule initial home visit, spoke with pt and wife Kalman Shan, home visit scheduled for next week.  Jacqlyn Larsen [facility], [place] Coordinator [phone]

## 2017-02-04 ENCOUNTER — Encounter: Payer: Self-pay | Admitting: *Deleted

## 2017-02-04 ENCOUNTER — Other Ambulatory Visit: Payer: Self-pay | Admitting: *Deleted

## 2017-02-04 NOTE — Patient Outreach (Signed)
[name]) Care Management   02/04/2017  Jimmy Franklin 1934-08-17 [id number]  [name] is an 83 y.o. male  Subjective: Initial home visit with pt, HIPAA verified, pt lives with wife and daughter,  Daughter assists pt as needed.  Pt states he has permanent foley cathteter due to neurogenic bladder and has changed at urologist office q 6 weeks.  Pt states he uses "walker sometimes" and is slower with ambulation due to CVA he had few years ago.  Pt states his appetite is better and "I'm eating more than I should"  Daughter checks pt blood pressure few times per week.    Objective:   Vitals:   02/04/17 1310 02/04/17 1314  BP: 117/60 117/60  Pulse:  (!) 48  Resp: 18 16  SpO2: 96% 96%  Weight: 206 lb (93.4 kg) 206 lb (93.4 kg)  Height: 1.854 m (6\' 1" ) 1.854 m (6\' 1" )   ROS  Physical Exam  Constitutional: He is oriented to person, place, and time. He appears well-developed and well-nourished.  HENT:  Head: Normocephalic.  Neck: Normal range of motion. Neck supple.  Cardiovascular:  HR 48  Respiratory: Effort normal and breath sounds normal.  GI: Soft. Bowel sounds are normal.  Genitourinary:  Genitourinary Comments: Foley catheter intact and patent draining without problem  Musculoskeletal: Normal range of motion. He exhibits no edema.  Neurological: He is alert and oriented to person, place, and time.  Skin: Skin is warm and dry.  Psychiatric: He has a normal mood and affect. His behavior is normal. Thought content normal.  Forgetful at times    Encounter Medications:   Outpatient Encounter Prescriptions as of 02/04/2017  Medication Sig Note  . clonazePAM (KLONOPIN) 0.5 MG tablet Take 0.5 mg by mouth daily.   . clopidogrel (PLAVIX) 75 MG tablet Take 75 mg by mouth daily.   Marland Kitchen escitalopram (LEXAPRO) 20 MG tablet Take 20 mg by mouth daily.   . furosemide (LASIX) 20 MG tablet Take 20 mg by mouth every morning.    Marland Kitchen liothyronine (CYTOMEL) 25 MCG  tablet Take 25 mcg by mouth daily.   Marland Kitchen lisinopril (PRINIVIL,ZESTRIL) 20 MG tablet Take 20 mg by mouth every morning.    . metoprolol succinate (TOPROL-XL) 50 MG 24 hr tablet Take 50 mg by mouth 2 (two) times daily at 10 AM and 5 PM. Take with or immediately following a meal.   . phenytoin (DILANTIN) 100 MG ER capsule Take 100-200 mg by mouth 2 (two) times daily. 2 in the morning and 1 in the evening 01/14/2015: Takes 2 in the  Morning and 1 in the evening  . polyethylene glycol powder (GLYCOLAX/MIRALAX) powder Take 17 g by mouth every morning.  01/16/2015: Received from: External Pharmacy  . ranitidine (ZANTAC) 300 MG tablet Take 300 mg by mouth at bedtime.   . simvastatin (ZOCOR) 10 MG tablet Take 40 mg by mouth at bedtime.    . traMADol (ULTRAM) 50 MG tablet Take 50 mg by mouth 3 (three) times daily.   . [DISCONTINUED] ALPRAZolam (XANAX) 0.25 MG tablet Take 0.25 mg by mouth every 6 (six) hours as needed. For anxiety   . [DISCONTINUED] bethanechol (URECHOLINE) 25 MG tablet Take 1 tablet (25 mg total) by mouth 3 (three) times daily.   . [DISCONTINUED] iron polysaccharides (NIFEREX) 150 MG capsule Take 150 mg by mouth daily.   . [DISCONTINUED] venlafaxine XR (EFFEXOR-XR) 150 MG 24 hr capsule Take 150 mg by mouth every morning.  No facility-administered encounter medications on file as of 02/04/2017.     Functional Status:   In your present state of health, do you have any difficulty performing the following activities: 02/04/2017 01/25/2017  Hearing? Tempie Donning  Vision? N N  Difficulty concentrating or making decisions? Tempie Donning  Walking or climbing stairs? Y Y  Dressing or bathing? N Y  Doing errands, shopping? Tempie Donning  Preparing Food and eating ? Y Y  Using the Toilet? N N  In the past six months, have you accidently leaked urine? Y Y  Do you have problems with loss of bowel control? N N  Managing your Medications? Y Y  Managing your Finances? Tempie Donning  Housekeeping or managing your Housekeeping? Y Y  Some  recent data might be hidden    Fall/Depression Screening:    Fall Risk  02/04/2017  Falls in the past year? Yes  Number falls in past yr: 2 or more  Injury with Fall? Yes  Risk Factor Category  High Fall Risk  Risk for fall due to : History of fall(s);Impaired balance/gait  Follow up Falls evaluation completed;Education provided;Falls prevention discussed   PHQ 2/9 Scores 02/04/2017 02/04/2017  PHQ - 2 Score 2 1  PHQ- 9 Score 4 -    Assessment:  RN CM focused on safety precautions and pt using walker at all times.  RN CM observed all medication bottles and reviewed with pt and daughter.  Pt reports no issues with medications.  Pt ask daughter to make pt appointment to have toenails trimmed.  RN CM faxed initial home visit and barrier letter to primary MD Dr. Quillian Quince and reported today's HR of 48 and that daughter will be checking HR daily and recording in [facility] calendar.  THN CM Care Plan Problem One     Most Recent Value  Care Plan Problem One  Pt high risk for falls  Role Documenting the Problem One  Care Management [place] for Problem One  Active  THN Long Term Goal   pt will have no falls within 60 days.  THN Long Term Goal Start Date  02/04/17  Interventions for Problem One Long Term Goal  RN CM educated pt and daughter on fall prevention, completed falls assessment, ask pt to use walker daily  THN CM Short Term Goal #1   pt will use walker consistenly (preferably at all tmes when ambulating) within 30 days  THN CM Short Term Goal #1 Start Date  02/04/17  Interventions for Short Term Goal #1  RN CM reviewed importance of using walker when ambulating to avoid falling, reminded pt to ask for assistance as needed, reviwed bathroom safety  THN CM Short Term Goal #2   pt will increase activity to facilitate increased endurance within 30 days  THN CM Short Term Goal #2 Start Date  02/04/17  Interventions for Short Term Goal #2  RN CM ask pt to walk short distances with walker  throughout the day and increase by a few minutes each day within 30 days.      Plan: follow up with home visit next month Call daughter Diane next week about HR (she is to record in [facility] calendar)  Jacqlyn Larsen [facility], [place] Coordinator [phone]

## 2017-02-08 ENCOUNTER — Other Ambulatory Visit: Payer: Self-pay | Admitting: *Deleted

## 2017-02-08 NOTE — Patient Outreach (Signed)
Telephone call to patient's home for follow up on heart rate issue and no answer to telephone, left voicemail requesting return phone call.  Telephone call to daughter Belinda Fisher and no answer to cell phone, left voicemail requesting return phone call.  Jacqlyn Larsen [facility], [place] Coordinator [phone]

## 2017-02-15 ENCOUNTER — Other Ambulatory Visit: Payer: Self-pay | Admitting: *Deleted

## 2017-02-15 NOTE — Patient Outreach (Signed)
Telephone call to patient/ daughter for follow up on heart rate, no answer to home phone and no option to leave voicemail, no answer to cell phone and received message stating " not accepting calls at this time".  PLAN See pt for home visit next week  Jacqlyn Larsen [facility], [place] Coordinator [phone]

## 2017-02-24 ENCOUNTER — Ambulatory Visit (INDEPENDENT_AMBULATORY_CARE_PROVIDER_SITE_OTHER): Payer: Medicare PPO | Admitting: Urology

## 2017-02-24 DIAGNOSIS — N312 Flaccid neuropathic bladder, not elsewhere classified: Secondary | ICD-10-CM | POA: Diagnosis not present

## 2017-02-25 ENCOUNTER — Other Ambulatory Visit: Payer: Self-pay | Admitting: *Deleted

## 2017-02-25 NOTE — Patient Outreach (Signed)
Telephone call to patient's daughter Shauna Hugh, pt to see podiatrist today and will not be at home for scheduled visit, RN CM rescheduled home visit for next week.  Jacqlyn Larsen [facility], [place] Coordinator [phone]

## 2017-03-04 ENCOUNTER — Encounter: Payer: Self-pay | Admitting: *Deleted

## 2017-03-04 ENCOUNTER — Other Ambulatory Visit: Payer: Self-pay | Admitting: *Deleted

## 2017-03-04 NOTE — Patient Outreach (Signed)
[place] [facility]) Care Management   03/04/2017  Jimmy Franklin 1933/11/30 [id number]  THOMOS DOMINE is an 83 y.o. male  Subjective: Routine home visit with pt, HIPAA verified, daughter Shauna Hugh present, reports she has not been checking heart rate daily, has "spot checked a few times and it was okay"  Pt still complains of pain at penis at catheter insertion site. Pt states he saw podiatrist and had toenails trimmed.  Pt reports he feels "tired all the time"   Denies any falls.  Pt states his "appetite is excellent and I eat too much".    Objective:   Vitals:   03/04/17 1313  BP: 106/60  Pulse: (!) 48  Resp: 16  SpO2: 96%  Weight: 208 lb (94.3 kg)   ROS  Physical Exam  Encounter Medications:   Outpatient Encounter Prescriptions as of 03/04/2017  Medication Sig Note  . clonazePAM (KLONOPIN) 0.5 MG tablet Take 0.5 mg by mouth daily.   . clopidogrel (PLAVIX) 75 MG tablet Take 75 mg by mouth daily.   Marland Kitchen escitalopram (LEXAPRO) 20 MG tablet Take 20 mg by mouth daily.   . furosemide (LASIX) 20 MG tablet Take 20 mg by mouth every morning.    Marland Kitchen liothyronine (CYTOMEL) 25 MCG tablet Take 25 mcg by mouth daily.   Marland Kitchen lisinopril (PRINIVIL,ZESTRIL) 20 MG tablet Take 20 mg by mouth every morning.    . metoprolol succinate (TOPROL-XL) 50 MG 24 hr tablet Take 50 mg by mouth 2 (two) times daily at 10 AM and 5 PM. Take with or immediately following a meal.   . phenytoin (DILANTIN) 100 MG ER capsule Take 200 mg by mouth 2 (two) times daily. 2 in the morning and 1 in the evening 01/14/2015: Takes 2 in the  Morning and 1 in the evening  . polyethylene glycol powder (GLYCOLAX/MIRALAX) powder Take 17 g by mouth every morning.  01/16/2015: Received from: External Pharmacy  . ranitidine (ZANTAC) 300 MG tablet Take 300 mg by mouth at bedtime.   . simvastatin (ZOCOR) 10 MG tablet Take 40 mg by mouth at bedtime.    . traMADol (ULTRAM) 50 MG tablet Take 50 mg by mouth 3 (three) times daily.    No  facility-administered encounter medications on file as of 03/04/2017.     Functional Status:   In your present state of health, do you have any difficulty performing the following activities: 02/04/2017 01/25/2017  Hearing? Tempie Donning  Vision? N N  Difficulty concentrating or making decisions? Tempie Donning  Walking or climbing stairs? Y Y  Dressing or bathing? N Y  Doing errands, shopping? Tempie Donning  Preparing Food and eating ? Y Y  Using the Toilet? N N  In the past six months, have you accidently leaked urine? Y Y  Do you have problems with loss of bowel control? N N  Managing your Medications? Y Y  Managing your Finances? Tempie Donning  Housekeeping or managing your Housekeeping? Y Y  Some recent data might be hidden    Fall/Depression Screening:    Fall Risk  02/04/2017  Falls in the past year? Yes  Number falls in past yr: 2 or more  Injury with Fall? Yes  Risk Factor Category  High Fall Risk  Risk for fall due to : History of fall(s);Impaired balance/gait  Follow up Falls evaluation completed;Education provided;Falls prevention discussed   PHQ 2/9 Scores 02/04/2017 02/04/2017  PHQ - 2 Score 2 1  PHQ- 9 Score 4 -  Assessment:  Telephone call to Dr. Quillian Quince office, spoke with Fara Chute reported heart rate of 48, faxed letter reporting the same.  RN CM reviewed medications, foley catheter patent and draining dark yellow urine, catheter is secured to leg and does not seem to be pulling at insertion site. Pt states he has talked with urologist but not a lot that can be done and pt not willing to self cath.  RN CM ask pt to stay well hydrated and drink mostly water.  Rn CM ask daughter (who is CNA) to check heart rate daily and record.  THN CM Care Plan Problem One     Most Recent Value  Care Plan Problem One  Pt high risk for falls  Role Documenting the Problem One  Care Management [place] for Problem One  Active  THN Long Term Goal   pt will have no falls within 60 days.  THN Long Term Goal  Start Date  02/04/17  Interventions for Problem One Long Term Goal  RN CM reinforced with pt and daughter on fall prevention, completed falls assessment, ask pt to use walker daily  THN CM Short Term Goal #1   pt will use walker consistenly (preferably at all tmes when ambulating) within 30 days  THN CM Short Term Goal #1 Start Date  03/04/17 Barrie Folk restarted]  Interventions for Short Term Goal #1  RN CM reinforced importance of using walker when ambulating to avoid falling, reminded pt to ask for assistance as needed, reviwed bathroom safety  THN CM Short Term Goal #2   pt will increase activity to facilitate increased endurance within 30 days  THN CM Short Term Goal #2 Start Date  03/04/17 Barrie Folk restarted]  Interventions for Short Term Goal #2  RN CM ask pt to slowly increase activity daily, reminded pt to take pain medication to stay ahead of the pain      Plan: follow up with home visit next month Assess heart rate log  Jacqlyn Larsen [facility], [place] [phone]

## 2017-03-24 ENCOUNTER — Ambulatory Visit (INDEPENDENT_AMBULATORY_CARE_PROVIDER_SITE_OTHER): Payer: Medicare PPO | Admitting: Urology

## 2017-03-24 DIAGNOSIS — N401 Enlarged prostate with lower urinary tract symptoms: Secondary | ICD-10-CM | POA: Diagnosis not present

## 2017-03-30 ENCOUNTER — Other Ambulatory Visit: Payer: Self-pay | Admitting: *Deleted

## 2017-03-30 ENCOUNTER — Encounter: Payer: Self-pay | Admitting: *Deleted

## 2017-03-30 NOTE — Patient Outreach (Signed)
[place] [facility]) Care Management   03/30/2017  Jimmy Franklin 1934-08-21 [id number]  Jimmy Franklin is an 84 y.o. male  Subjective: Routine home visit with pt, HIPAA verified, no new concerns reported, daughter states she has not been checking heart rate and no changes have been made with medications, etc.    Objective:   Vitals:   03/30/17 1431  BP: 112/60  Pulse: 61  Resp: 16  SpO2: 95%   ROS  Physical Exam  Constitutional: He is oriented to person, place, and time. He appears well-developed and well-nourished.  HENT:  Head: Normocephalic.  Neck: Normal range of motion. Neck supple.  Cardiovascular: Normal rate.   Respiratory: Effort normal and breath sounds normal.  GI: Soft. Bowel sounds are normal.  Genitourinary:  Genitourinary Comments: Foley catheter intact draining dark yellow urine  Musculoskeletal: Normal range of motion.  Neurological: He is alert and oriented to person, place, and time.  Skin: Skin is warm and dry.  Psychiatric: He has a normal mood and affect. His behavior is normal. Judgment and thought content normal.    Encounter Medications:   Outpatient Encounter Prescriptions as of 03/30/2017  Medication Sig Note  . clonazePAM (KLONOPIN) 0.5 MG tablet Take 0.5 mg by mouth daily.   . clopidogrel (PLAVIX) 75 MG tablet Take 75 mg by mouth daily.   Marland Kitchen escitalopram (LEXAPRO) 20 MG tablet Take 20 mg by mouth daily.   . furosemide (LASIX) 20 MG tablet Take 20 mg by mouth every morning.    Marland Kitchen liothyronine (CYTOMEL) 25 MCG tablet Take 25 mcg by mouth daily.   Marland Kitchen lisinopril (PRINIVIL,ZESTRIL) 20 MG tablet Take 20 mg by mouth every morning.    . metoprolol succinate (TOPROL-XL) 50 MG 24 hr tablet Take 50 mg by mouth 2 (two) times daily at 10 AM and 5 PM. Take with or immediately following a meal.   . phenytoin (DILANTIN) 100 MG ER capsule Take 200 mg by mouth 2 (two) times daily. 2 in the morning and 1 in the evening 01/14/2015: Takes 2 in the   Morning and 1 in the evening  . polyethylene glycol powder (GLYCOLAX/MIRALAX) powder Take 17 g by mouth every morning.  01/16/2015: Received from: External Pharmacy  . ranitidine (ZANTAC) 300 MG tablet Take 300 mg by mouth at bedtime.   . simvastatin (ZOCOR) 10 MG tablet Take 40 mg by mouth at bedtime.    . traMADol (ULTRAM) 50 MG tablet Take 50 mg by mouth 3 (three) times daily.    No facility-administered encounter medications on file as of 03/30/2017.     Functional Status:   In your present state of health, do you have any difficulty performing the following activities: 02/04/2017 01/25/2017  Hearing? Tempie Donning  Vision? N N  Difficulty concentrating or making decisions? Tempie Donning  Walking or climbing stairs? Y Y  Dressing or bathing? N Y  Doing errands, shopping? Tempie Donning  Preparing Food and eating ? Y Y  Using the Toilet? N N  In the past six months, have you accidently leaked urine? Y Y  Do you have problems with loss of bowel control? N N  Managing your Medications? Y Y  Managing your Finances? Tempie Donning  Housekeeping or managing your Housekeeping? Y Y  Some recent data might be hidden    Fall/Depression Screening:    Fall Risk  03/30/2017 02/04/2017  Falls in the past year? Yes Yes  Number falls in past yr: 2 or more 2  or more  Injury with Fall? Yes Yes  Risk Factor Category  High Fall Risk High Fall Risk  Risk for fall due to : - History of fall(s);Impaired balance/gait  Follow up Falls evaluation completed;Education provided Falls evaluation completed;Education provided;Falls prevention discussed   PHQ 2/9 Scores 02/04/2017 02/04/2017  PHQ - 2 Score 2 1  PHQ- 9 Score 4 -    Assessment:  Patient's daughter continues assisting pt in the home, pt using walker to ambulate, RN CM reiterated safety precautions.  Had foley catheter changed few days ago at urologist.  RN CM discussed discharge plan with pt and will discharge today.  RN CM faxed case closure to primary MD Dr. Gar Ponto and mailed case  closure letter to patient's home.  Plan: discharge today  Jacqlyn Larsen [facility], BSN [place] Coordinator [phone]

## 2017-03-31 ENCOUNTER — Encounter: Payer: Self-pay | Admitting: *Deleted

## 2017-04-08 ENCOUNTER — Encounter: Payer: Self-pay | Admitting: Internal Medicine

## 2017-04-21 ENCOUNTER — Ambulatory Visit (INDEPENDENT_AMBULATORY_CARE_PROVIDER_SITE_OTHER): Payer: Medicare PPO | Admitting: Urology

## 2017-04-21 DIAGNOSIS — N401 Enlarged prostate with lower urinary tract symptoms: Secondary | ICD-10-CM

## 2017-05-19 ENCOUNTER — Ambulatory Visit (INDEPENDENT_AMBULATORY_CARE_PROVIDER_SITE_OTHER): Payer: Medicare PPO | Admitting: Urology

## 2017-05-19 ENCOUNTER — Other Ambulatory Visit (HOSPITAL_COMMUNITY)
Admission: RE | Admit: 2017-05-19 | Discharge: 2017-05-19 | Disposition: A | Discharge status: Home / Self Care | Payer: Medicare PPO | Source: Other Acute Inpatient Hospital | Attending: Urology | Admitting: Urology

## 2017-05-19 DIAGNOSIS — N312 Flaccid neuropathic bladder, not elsewhere classified: Secondary | ICD-10-CM | POA: Diagnosis not present

## 2017-05-19 DIAGNOSIS — N401 Enlarged prostate with lower urinary tract symptoms: Secondary | ICD-10-CM | POA: Insufficient documentation

## 2017-05-23 LAB — URINE CULTURE

## 2017-06-09 ENCOUNTER — Ambulatory Visit (INDEPENDENT_AMBULATORY_CARE_PROVIDER_SITE_OTHER): Payer: Medicare PPO | Admitting: Gastroenterology

## 2017-06-09 ENCOUNTER — Encounter: Payer: Self-pay | Admitting: Gastroenterology

## 2017-06-09 ENCOUNTER — Other Ambulatory Visit: Payer: Self-pay

## 2017-06-09 DIAGNOSIS — R1310 Dysphagia, unspecified: Secondary | ICD-10-CM

## 2017-06-09 NOTE — Patient Instructions (Signed)
We have scheduled you for an upper endoscopy and dilation with Dr. Rourk.  Further recommendations to follow.    

## 2017-06-09 NOTE — Progress Notes (Signed)
  Primary Care Physician:  Daniel, Terry G, MD Primary Gastroenterologist:  Dr. Rourk   Chief Complaint  Patient presents with  . Dysphagia    HPI:   Jimmy Franklin is a 83 y.o. male presenting today at the request of Daniel, Terry G, MD secondary to dysphagia.   He states that he had a stroke 5 years ago and was hospitalized at [facility], and since that time he has had problems swallowing. Daughter present and states speech evaluated him many years ago. Lately swallowing has worsened. Sometimes gets so choked that daugher fears he may aspirate. Issues with cornbread, crackers, spicy foods, popcorn. No difficulty with meat. Just things with crumbs/grains. Getting strangled on saliva as well. Unable to find information in care everywhere regarding any speech evaluation.   No abdominal pain. No unexplained weight loss or lack of appetite. Last colonoscopy about 10 years ago at [facility]. Believes he may have had a few polyps. Miralax helps with constipation. No PPI. Only on ranitidine.   Past Medical History:  Diagnosis Date  . Bowel habit changes 03/21/2011  . Chronic constipation 08  . COPD (chronic obstructive pulmonary disease) (HCC)   . Depression   . Foley catheter in place 03-17-13  . GERD (gastroesophageal reflux disease)   . Hearing loss of both ears   . Hepatitis    20 yrs ago  . Hx of colonic polyps   . Mixed hyperlipidemia   . Polymyalgia rheumatica (HCC)   . Seizures (HCC)    caused by UTI-none recent- over 1 yr  . Stroke (HCC)    CVA- 3 yrs ago-left sided weakness, mild residual  . Unspecified essential hypertension     Past Surgical History:  Procedure Laterality Date  . CATARACT EXTRACTION, BILATERAL    . CHOLECYSTECTOMY     lap. gallbladder removal  . COLONOSCOPY  07/04/07  . TONSILLECTOMY    . TRANSURETHRAL INCISION OF BLADDER NECK N/A 01/24/2015   Procedure: TRANSURETHRAL INCISION OF BLADDER NECK;  Surgeon: Stephen Dahlstedt, MD;  Location: [facility];   Service: Urology;  Laterality: N/A;  **TRANSURETHRAL RESECTION BLADDER NECK CONTRACTURE**     . TRANSURETHRAL RESECTION OF PROSTATE N/A 03/23/2013   Procedure: TRANSURETHRAL RESECTION OF THE PROSTATE (TURP);  Surgeon: Stephen Dahlstedt, MD;  Location: [facility]S;  Service: Urology;  Laterality: N/A;    Current Outpatient Prescriptions  Medication Sig Dispense Refill  . clonazePAM (KLONOPIN) 0.5 MG tablet Take 0.5 mg by mouth daily.    . clopidogrel (PLAVIX) 75 MG tablet Take 75 mg by mouth daily.    . escitalopram (LEXAPRO) 20 MG tablet Take 20 mg by mouth daily.    . furosemide (LASIX) 20 MG tablet Take 20 mg by mouth every morning.     . liothyronine (CYTOMEL) 25 MCG tablet Take 25 mcg by mouth daily.    . lisinopril (PRINIVIL,ZESTRIL) 20 MG tablet Take 20 mg by mouth every morning.     . metoprolol succinate (TOPROL-XL) 50 MG 24 hr tablet Take 50 mg by mouth 2 (two) times daily at 10 AM and 5 PM. Take with or immediately following a meal.    . phenytoin (DILANTIN) 100 MG ER capsule Take 200 mg by mouth 2 (two) times daily. 2 in the morning and 1 in the evening    . polyethylene glycol powder (GLYCOLAX/MIRALAX) powder Take 17 g by mouth every morning.     . ranitidine (ZANTAC) 300 MG tablet Take 300 mg by mouth at bedtime.    .   simvastatin (ZOCOR) 10 MG tablet Take 40 mg by mouth at bedtime.     . traMADol (ULTRAM) 50 MG tablet Take 50 mg by mouth 3 (three) times daily.     No current facility-administered medications for this visit.     Allergies as of 06/09/2017  . (No Known Allergies)    No family history on file.  Social History   Social History  . Marital status: Married    Spouse name: N/A  . Number of children: N/A  . Years of education: N/A   Occupational History  . Not on file.   Social History Main Topics  . Smoking status: Former Smoker    Quit date: 03/18/1967  . Smokeless tobacco: Former User  . Alcohol use No  . Drug use: No  . Sexual activity: Not Currently    Other Topics Concern  . Not on file   Social History Narrative  . No narrative on file    Review of Systems: Gen: Denies any fever, chills, fatigue, weight loss, lack of appetite.  CV: Denies chest pain, heart palpitations, peripheral edema, syncope.  Resp: Denies shortness of breath at rest or with exertion. Denies wheezing or cough.  GI: see HPI  GU : Denies urinary burning, urinary frequency, urinary hesitancy MS: Denies joint pain, muscle weakness, cramps, or limitation of movement.  Derm: Denies rash, itching, dry skin Psych: Denies depression, anxiety, memory loss, and confusion Heme: see HPI   Physical Exam: BP 105/61   Pulse (!) 57   Temp 98.1 F (36.7 C) (Oral)   Ht 6' 1" (1.854 m)   Wt 206 lb 6.4 oz (93.6 kg)   BMI 27.23 kg/m  General:   Alert and oriented. Pleasant and cooperative. Well-nourished and well-developed.  Head:  Normocephalic and atraumatic. Eyes:  Without icterus, sclera clear and conjunctiva pink.  Ears:  Normal auditory acuity. Nose:  No deformity, discharge,  or lesions. Mouth:  No deformity or lesions, oral mucosa pink.  Lungs:  Clear to auscultation bilaterally. No wheezes, rales, or rhonchi. No distress.  Heart:  S1, S2 present without murmurs appreciated.  Abdomen:  +BS, soft, non-tender and non-distended. No HSM noted. No guarding or rebound. No masses appreciated. Small umbilical hernia  Rectal:  Deferred  Msk:  Kyphosis  Extremities:  Without  edema. Neurologic:  Alert and  oriented x4 Psych:  Alert and cooperative. Normal mood and affect.  Outside labs July 2018: Hgb 14.2, Hct 43.6, Tbili 0.3, Alk Phos 115, AST 16, ALT 13 

## 2017-06-10 ENCOUNTER — Telehealth: Payer: Self-pay

## 2017-06-10 NOTE — Assessment & Plan Note (Addendum)
83 year old male with history of dysphagia since stroke 5 years ago; however, symptoms are now worsening recently. He reports evaluation by speech 5 years ago at [facility] after stroke, but I do not have access to this. He has never had an EGD. Dysphagia likely multifactorial in this setting, but as he has never had an upper GI evaluation via endoscopy, will pursue this now. Will likely need BPE after EGD for further evaluation if no significant findings on endoscopy. As of note, he is on Plavix but no anticoagulation.   Proceed with upper endoscopy/dilatation in the near future with Dr. Gala Romney. The risks, benefits, and alternatives have been discussed in detail with patient. They have stated understanding and desire to proceed.  Continue Zantac for now but anticipate starting a PPI after EGD: will await findings first Possible BPE after EGD, to be determined

## 2017-06-10 NOTE — Telephone Encounter (Signed)
Called and informed pt's daughter Shauna Hugh) of pre-op appt 07/02/17 at 12:45pm. Letter mailed.

## 2017-06-10 NOTE — Progress Notes (Signed)
cc'ed to pcp °

## 2017-06-12 ENCOUNTER — Encounter (HOSPITAL_COMMUNITY): Payer: Self-pay | Admitting: Emergency Medicine

## 2017-06-12 ENCOUNTER — Emergency Department (HOSPITAL_COMMUNITY)
Admission: EM | Admit: 2017-06-12 | Discharge: 2017-06-12 | Disposition: A | Discharge status: Home / Self Care | Payer: Medicare PPO | Attending: Emergency Medicine | Admitting: Emergency Medicine

## 2017-06-12 DIAGNOSIS — Z79899 Other long term (current) drug therapy: Secondary | ICD-10-CM | POA: Diagnosis not present

## 2017-06-12 DIAGNOSIS — R310 Gross hematuria: Secondary | ICD-10-CM

## 2017-06-12 DIAGNOSIS — E785 Hyperlipidemia, unspecified: Secondary | ICD-10-CM | POA: Diagnosis not present

## 2017-06-12 DIAGNOSIS — Z87891 Personal history of nicotine dependence: Secondary | ICD-10-CM | POA: Insufficient documentation

## 2017-06-12 DIAGNOSIS — Z8673 Personal history of transient ischemic attack (TIA), and cerebral infarction without residual deficits: Secondary | ICD-10-CM | POA: Insufficient documentation

## 2017-06-12 DIAGNOSIS — N390 Urinary tract infection, site not specified: Secondary | ICD-10-CM | POA: Insufficient documentation

## 2017-06-12 DIAGNOSIS — I10 Essential (primary) hypertension: Secondary | ICD-10-CM | POA: Diagnosis not present

## 2017-06-12 DIAGNOSIS — J449 Chronic obstructive pulmonary disease, unspecified: Secondary | ICD-10-CM | POA: Insufficient documentation

## 2017-06-12 HISTORY — DX: Benign prostatic hyperplasia without lower urinary tract symptoms: N40.0

## 2017-06-12 LAB — URINALYSIS, MICROSCOPIC (REFLEX)

## 2017-06-12 LAB — URINALYSIS, ROUTINE W REFLEX MICROSCOPIC
Glucose, UA: NEGATIVE mg/dL
NITRITE: POSITIVE — AB
Protein, ur: >300 mg/dL — AB
SPECIFIC GRAVITY, URINE: 1.02 (ref 1.005–1.030)
pH: 8 (ref 5.0–8.0)

## 2017-06-12 LAB — BASIC METABOLIC PANEL
ANION GAP: 7 (ref 5–15)
BUN: 19 mg/dL (ref 6–20)
CO2: 23 mmol/L (ref 22–32)
Calcium: 8.3 mg/dL — ABNORMAL LOW (ref 8.9–10.3)
Chloride: 108 mmol/L (ref 101–111)
Creatinine, Ser: 0.75 mg/dL (ref 0.61–1.24)
GFR calc Af Amer: >60 mL/min (ref >60)
Glucose, Bld: 108 mg/dL — ABNORMAL HIGH (ref 65–99)
Potassium: 4.1 mmol/L (ref 3.5–5.1)
Sodium: 138 mmol/L (ref 135–145)

## 2017-06-12 LAB — CBC WITH DIFFERENTIAL/PLATELET
BASOS ABS: 0 10*3/uL (ref 0.0–0.1)
Basophils Relative: 0 %
EOS PCT: 2 %
Eosinophils Absolute: 0.2 10*3/uL (ref 0.0–0.7)
HCT: 41.3 % (ref 39.0–52.0)
Hemoglobin: 13.7 g/dL (ref 13.0–17.0)
LYMPHS PCT: 12 %
Lymphs Abs: 1.2 10*3/uL (ref 0.7–4.0)
MCH: 28 pg (ref 26.0–34.0)
MCHC: 33.2 g/dL (ref 30.0–36.0)
MCV: 84.5 fL (ref 78.0–100.0)
Monocytes Absolute: 0.8 10*3/uL (ref 0.1–1.0)
Monocytes Relative: 8 %
NEUTROS PCT: 78 %
Neutro Abs: 8 10*3/uL — ABNORMAL HIGH (ref 1.7–7.7)
PLATELETS: 121 10*3/uL — AB (ref 150–400)
RBC: 4.89 MIL/uL (ref 4.22–5.81)
RDW: 14.5 % (ref 11.5–15.5)
WBC: 10.2 10*3/uL (ref 4.0–10.5)

## 2017-06-12 MED ORDER — DEXTROSE 5 % IV SOLN
1.0000 g | Freq: Once | INTRAVENOUS | Status: AC
  Administered 2017-06-12: 1 g via INTRAVENOUS
  Filled 2017-06-12: qty 10

## 2017-06-12 MED ORDER — CEPHALEXIN 500 MG PO CAPS: 500.0000 mg | ORAL_CAPSULE | Freq: Three times a day (TID) | ORAL | 0 refills | Status: AC

## 2017-06-12 MED ORDER — CIPROFLOXACIN HCL 500 MG PO TABS: 500.0000 mg | ORAL_TABLET | Freq: Two times a day (BID) | ORAL | 0 refills | Status: AC

## 2017-06-12 MED ORDER — SODIUM CHLORIDE 0.9 % IV BOLUS (SEPSIS)
500.0000 mL | Freq: Once | INTRAVENOUS | Status: AC
  Administered 2017-06-12: 500 mL via INTRAVENOUS

## 2017-06-12 NOTE — Discharge Instructions (Signed)
Your bleeding is likely from urinary tract infection, made worse with plavix.  Take antibiotics as prescribed. Return without fail for worsening symptoms, including fever, intractable vomiting, severe abdominal pain, confusion, passing out, extreme fatigue, or any other symptoms concerning to you.  Please call Dr. Zannie Cove for follow-up this week

## 2017-06-12 NOTE — ED Triage Notes (Signed)
Pt reports indwelling catheter that is changed every 4-6 weeks. Per pt family, catheter last changed on 05/23/17. Pt reports woke up this am and reports drainage bag "full of blood". Dark blood noted to drainage bag at time of assisting pt into gown. Pt denies fever, chills, recent injury/procedure.

## 2017-06-12 NOTE — ED Provider Notes (Signed)
[facility] DEPT Provider Note   CSN: [id number] Arrival date & time: 06/12/17  0915     History   Chief Complaint Chief Complaint  Patient presents with  . Hematuria    HPI Jimmy Franklin is a 83 y.o. male.  The history is provided by the patient.  Hematuria  This is a new problem. The current episode started 3 to 5 hours ago. The problem occurs constantly. The problem has not changed since onset.Associated symptoms include abdominal pain. Pertinent negatives include no chest pain and no shortness of breath. Nothing aggravates the symptoms. Nothing relieves the symptoms. He has tried nothing for the symptoms.    83 year old male who presents with hematuria. History of chronic indwelling foley, last exchanged 05/23/2017. Also history of CVA on plavix, HTN, COPD, HLD. Reports waking up this morning with blood in his bag. With suprapubic abdominal discomfort and sensation of a full bladder. Mucous from foley yesterday. No fever, chills, nausea, vomiting, flank pain, chest pain or difficulty breathing. Feels weak since this morning. No prior history of hematuria.  Past Medical History:  Diagnosis Date  . Bowel habit changes 03/21/2011  . Chronic constipation 08  . COPD (chronic obstructive pulmonary disease) ([place])   . Depression   . Enlarged prostate   . Foley catheter in place 03-17-13  . GERD (gastroesophageal reflux disease)   . Hearing loss of both ears   . Hepatitis    20 yrs ago, states he turned yellow   . Hx of colonic polyps   . Mixed hyperlipidemia   . Polymyalgia rheumatica ([place])   . Seizures ([place])    caused by UTI-none recent- over 1 yr  . Stroke [facility])    CVA- 2013, left sided weakness, mild residual  . Subarachnoid hemorrhage ([place])   . Unspecified essential hypertension     Patient Active Problem List   Diagnosis Date Noted  . Dysphagia 06/09/2017  . [place] (bladder neck contracture) 01/24/2015  . History of stroke 02/16/2012  . Parotid nodule 02/16/2012   . Seizure ([place]) 02/14/2012  . UTI (lower urinary tract infection) 02/14/2012  . HTN (hypertension) 02/14/2012  . Dyslipidemia 02/14/2012  . Rhabdomyolysis 02/14/2012    Past Surgical History:  Procedure Laterality Date  . CATARACT EXTRACTION, BILATERAL    . CHOLECYSTECTOMY     lap. gallbladder removal  . COLONOSCOPY  07/04/07  . TONSILLECTOMY    . TRANSURETHRAL INCISION OF BLADDER NECK N/A 01/24/2015   Procedure: TRANSURETHRAL INCISION OF BLADDER NECK;  Surgeon: Franchot Gallo, MD;  Location: [facility] ORS;  Service: Urology;  Laterality: N/A;  **TRANSURETHRAL RESECTION BLADDER NECK CONTRACTURE**     . TRANSURETHRAL RESECTION OF PROSTATE N/A 03/23/2013   Procedure: TRANSURETHRAL RESECTION OF THE PROSTATE (TURP);  Surgeon: Franchot Gallo, MD;  Location: [facility] ORS;  Service: Urology;  Laterality: N/A;       Home Medications    Prior to Admission medications   Medication Sig Start Date End Date Taking? Authorizing Provider  cephALEXin (KEFLEX) 500 MG capsule Take 1 capsule (500 mg total) by mouth 3 (three) times daily. 06/12/17 06/22/17  Forde Dandy, MD  ciprofloxacin (CIPRO) 500 MG tablet Take 1 tablet (500 mg total) by mouth 2 (two) times daily. 06/12/17 06/22/17  Forde Dandy, MD  clonazePAM (KLONOPIN) 0.5 MG tablet Take 0.5 mg by mouth daily.    [provider]  clopidogrel (PLAVIX) 75 MG tablet Take 75 mg by mouth daily.    [provider]  escitalopram (  LEXAPRO) 20 MG tablet Take 20 mg by mouth daily.    [provider]  furosemide (LASIX) 20 MG tablet Take 20 mg by mouth every morning.     [provider]  liothyronine (CYTOMEL) 25 MCG tablet Take 25 mcg by mouth daily.    [provider]  lisinopril (PRINIVIL,ZESTRIL) 20 MG tablet Take 20 mg by mouth every morning.     [provider]  metoprolol succinate (TOPROL-XL) 50 MG 24 hr tablet Take 50 mg by mouth 2 (two) times daily at 10 AM and 5 PM. Take with or immediately  following a meal.    [provider]  phenytoin (DILANTIN) 100 MG ER capsule Take 200 mg by mouth 2 (two) times daily. 2 in the morning and 2 in the evening    [provider]  polyethylene glycol powder (GLYCOLAX/MIRALAX) powder Take 17 g by mouth every morning. Only as needed 12/11/14   [provider]  ranitidine (ZANTAC) 300 MG tablet Take 300 mg by mouth at bedtime.    [provider]  simvastatin (ZOCOR) 10 MG tablet Take 40 mg by mouth at bedtime.     [provider]  traMADol (ULTRAM) 50 MG tablet Take 50 mg by mouth 3 (three) times daily.    [provider]    Family History Family History  Problem Relation Age of Onset  . Colon cancer Mother   . Colon cancer Father     Social History Social History  Substance Use Topics  . Smoking status: Former Smoker    Quit date: 03/18/1967  . Smokeless tobacco: Former Systems developer  . Alcohol use No     Allergies   Patient has no known allergies.   Review of Systems Review of Systems  Constitutional: Negative for fever.  Respiratory: Negative for shortness of breath.   Cardiovascular: Negative for chest pain.  Gastrointestinal: Positive for abdominal pain.  Genitourinary: Positive for hematuria.  All other systems reviewed and are negative.    Physical Exam Updated Vital Signs BP (!) 143/84 (BP Location: Left Arm)   Pulse 81   Temp 97.8 F (36.6 C) (Oral)   Resp 14   Ht 6\' 1"  (1.854 m)   Wt 93.4 kg (206 lb)   SpO2 96%   BMI 27.18 kg/m   Physical Exam Physical Exam  Nursing note and vitals reviewed. Constitutional: chronically ill appearing, non-toxic, and in no acute distress Head: Normocephalic and atraumatic.  Mouth/Throat: Oropharynx is clear and moist.  Neck: Normal range of motion. Neck supple.  Cardiovascular: Normal rate and regular rhythm.   Pulmonary/Chest: Effort normal and breath sounds normal.  Abdominal: Soft. There is mild suprapubic tenderness. There  is no rebound and no guarding.  Musculoskeletal: Normal range of motion.  Neurological: Alert, no facial droop, fluent speech, moves all extremities symmetrically Skin: Skin is warm and dry.  Psychiatric: Cooperative   ED Treatments / Results  Labs (all labs ordered are listed, but only abnormal results are displayed) Labs Reviewed  CBC WITH DIFFERENTIAL/PLATELET - Abnormal; Notable for the following:       Result Value   Platelets 121 (*)    Neutro Abs 8.0 (*)    All other components within normal limits  BASIC METABOLIC PANEL - Abnormal; Notable for the following:    Glucose, Bld 108 (*)    Calcium 8.3 (*)    All other components within normal limits  URINALYSIS, ROUTINE W REFLEX MICROSCOPIC - Abnormal; Notable for the following:  Color, Urine BROWN (*)    APPearance CLOUDY (*)    Hgb urine dipstick LARGE (*)    Bilirubin Urine MODERATE (*)    Ketones, ur TRACE (*)    Protein, ur >300 (*)    Nitrite POSITIVE (*)    Leukocytes, UA MODERATE (*)    All other components within normal limits  URINALYSIS, MICROSCOPIC (REFLEX) - Abnormal; Notable for the following:    Bacteria, UA MANY (*)    Squamous Epithelial / LPF 6-30 (*)    All other components within normal limits  URINE CULTURE    EKG  EKG Interpretation None       Radiology No results found.  Procedures Procedures (including critical care time)  Medications Ordered in ED Medications  cefTRIAXone (ROCEPHIN) 1 g in dextrose 5 % 50 mL IVPB (0 g Intravenous Stopped 06/12/17 1137)  sodium chloride 0.9 % bolus 500 mL (500 mLs Intravenous New Bag/Given 06/12/17 1107)     Initial Impression / Assessment and Plan / ED Course  I have reviewed the triage vital signs and the nursing notes.  Pertinent labs & imaging results that were available during my care of the patient were reviewed by me and considered in my medical decision making (see chart for details).     83 year old male With chronic indwelling Foley  catheter who presents with hematuria. Takes Plavix. Nontoxic and in No Acute Distress. Afebrile and hemodynamically stable. He is a soft and benign abdomen. I attempted to irrigate catheter, but appears clogged. Subsequently Foley catheter was exchanged. He is evidence of hematuria but also with evidence of urinary tract infection on UA. He does report passing mucous and having cloudier urine over the past day or 2. Presentation consistent with a UTI. Blood work overall reassuring with minimal leukocytosis, stable renal function, baseline thrombocytopenia, and normal hemoglobin. He did receive a dose of ceftriaxone here in the ED. Urine is sent for culture.   Records reviewed. History of pansensitive pseudomonas and ecoli resistant to ciprofloxacin on previous urine cultures. Since unable to cover ecoli with just fluoroquinolone will also give keflex. Patient has follow-up this week with urologist, Dr. Diona Fanti. Discussed strict return instructions for worsening symptoms. Strict return and follow-up instructions reviewed. He expressed understanding of all discharge instructions and felt comfortable with the plan of care.    Final Clinical Impressions(s) / ED Diagnoses   Final diagnoses:  Gross hematuria  Lower urinary tract infectious disease     Forde Dandy, MD 06/12/17 1212

## 2017-06-16 LAB — URINE CULTURE

## 2017-06-17 ENCOUNTER — Telehealth: Payer: Self-pay | Admitting: Emergency Medicine

## 2017-06-17 NOTE — Telephone Encounter (Signed)
Post ED Visit - Positive Culture Follow-up: Successful Patient Follow-Up  Culture assessed and recommendations reviewed by: []  Elenor Quinones, Pharm.D. [x]  Heide Guile, Pharm.D., BCPS AQ-ID []  Parks Neptune, Pharm.D., BCPS []  Alycia Rossetti, Pharm.D., BCPS []  [place], Pharm.D., BCPS, AAHIVP []  Legrand Como, Pharm.D., BCPS, AAHIVP []  Salome Arnt, PharmD, BCPS []  Dimitri Ped, PharmD, BCPS []  Vincenza Hews, PharmD, BCPS  Positive urine culture  []  Patient discharged without antimicrobial prescription and treatment is now indicated [x]  Organism is resistant to prescribed ED discharge antimicrobial []  Patient with positive blood cultures  Changes discussed with ED provider: Martinique Russo PA New antibiotic prescription continue ciprolfoxacin, stop cephalexin   Contacted daughter 06/17/2017   [name] 06/17/2017, 1:28 PM

## 2017-06-18 ENCOUNTER — Ambulatory Visit (INDEPENDENT_AMBULATORY_CARE_PROVIDER_SITE_OTHER): Payer: Medicare PPO | Admitting: Urology

## 2017-06-18 DIAGNOSIS — N401 Enlarged prostate with lower urinary tract symptoms: Secondary | ICD-10-CM | POA: Diagnosis not present

## 2017-06-29 NOTE — Patient Instructions (Signed)
Jimmy Franklin  06/29/2017     @PREFPERIOPPHARMACY @   Your procedure is scheduled on  07/08/2017   Report to [facility] at  615  A.M.  Call this number if you have problems the morning of surgery:  [phone]   Remember:  Do not eat food or drink liquids after midnight.  Take these medicines the morning of surgery with A SIP OF WATER lexapro, cytomel, lisinopril, metoprolol, dilantin, zantac.   Do not wear jewelry, make-up or nail polish.  Do not wear lotions, powders, or perfumes, or deoderant.  Do not shave 48 hours prior to surgery.  Men may shave face and neck.  Do not bring valuables to the hospital.  [facility] is not responsible for any belongings or valuables.  Contacts, dentures or bridgework may not be worn into surgery.  Leave your suitcase in the car.  After surgery it may be brought to your room.  For patients admitted to the hospital, discharge time will be determined by your treatment team.  Patients discharged the day of surgery will not be allowed to drive home.   Name and phone number of your driver:   family Special instructions:  Follow the diet instructions given to you by Dr Roseanne Kaufman office.  Please read over the following fact sheets that you were given. Anesthesia Post-op Instructions and Care and Recovery After Surgery       Esophagogastroduodenoscopy Esophagogastroduodenoscopy (EGD) is a procedure to examine the lining of the esophagus, stomach, and first part of the small intestine (duodenum). This procedure is done to check for problems such as inflammation, bleeding, ulcers, or growths. During this procedure, a long, flexible, lighted tube with a camera attached (endoscope) is inserted down the throat. Tell a health care provider about:  Any allergies you have.  All medicines you are taking, including vitamins, herbs, eye drops, creams, and over-the-counter medicines.  Any problems you or family members have had  with anesthetic medicines.  Any blood disorders you have.  Any surgeries you have had.  Any medical conditions you have.  Whether you are pregnant or may be pregnant. What are the risks? Generally, this is a safe procedure. However, problems may occur, including:  Infection.  Bleeding.  A tear (perforation) in the esophagus, stomach, or duodenum.  Trouble breathing.  Excessive sweating.  Spasms of the larynx.  A slowed heartbeat.  Low blood pressure.  What happens before the procedure?  Follow instructions from your health care provider about eating or drinking restrictions.  Ask your health care provider about: ? Changing or stopping your regular medicines. This is especially important if you are taking diabetes medicines or blood thinners. ? Taking medicines such as aspirin and ibuprofen. These medicines can thin your blood. Do not take these medicines before your procedure if your health care provider instructs you not to.  Plan to have someone take you home after the procedure.  If you wear dentures, be ready to remove them before the procedure. What happens during the procedure?  To reduce your risk of infection, your health care team will wash or sanitize their hands.  An IV tube will be put in a vein in your hand or arm. You will get medicines and fluids through this tube.  You will be given one or more of the following: ? A medicine to help you relax (sedative). ? A medicine to numb the area (local anesthetic). This medicine  may be sprayed into your throat. It will make you feel more comfortable and keep you from gagging or coughing during the procedure. ? A medicine for pain.  A mouth guard may be placed in your mouth to protect your teeth and to keep you from biting on the endoscope.  You will be asked to lie on your left side.  The endoscope will be lowered down your throat into your esophagus, stomach, and duodenum.  Air will be put into the  endoscope. This will help your health care provider see better.  The lining of your esophagus, stomach, and duodenum will be examined.  Your health care provider may: ? Take a tissue sample so it can be looked at in a lab (biopsy). ? Remove growths. ? Remove objects (foreign bodies) that are stuck. ? Treat any bleeding with medicines or other devices that stop tissue from bleeding. ? Widen (dilate) or stretch narrowed areas of your esophagus and stomach.  The endoscope will be taken out. The procedure may vary among health care providers and hospitals. What happens after the procedure?  Your blood pressure, heart rate, breathing rate, and blood oxygen level will be monitored often until the medicines you were given have worn off.  Do not eat or drink anything until the numbing medicine has worn off and your gag reflex has returned. This information is not intended to replace advice given to you by your health care provider. Make sure you discuss any questions you have with your health care provider. Document Released: 12/25/2004 Document Revised: 01/30/2016 Document Reviewed: 07/18/2015 Elsevier Interactive Patient Education  2018 [employer]. Esophagogastroduodenoscopy, Care After Refer to this sheet in the next few weeks. These instructions provide you with information about caring for yourself after your procedure. Your health care provider may also give you more specific instructions. Your treatment has been planned according to current medical practices, but problems sometimes occur. Call your health care provider if you have any problems or questions after your procedure. What can I expect after the procedure? After the procedure, it is common to have:  A sore throat.  Nausea.  Bloating.  Dizziness.  Fatigue.  Follow these instructions at home:  Do not eat or drink anything until the numbing medicine (local anesthetic) has worn off and your gag reflex has returned. You  will know that the local anesthetic has worn off when you can swallow comfortably.  Do not drive for 24 hours if you received a medicine to help you relax (sedative).  If your health care provider took a tissue sample for testing during the procedure, make sure to get your test results. This is your responsibility. Ask your health care provider or the department performing the test when your results will be ready.  Keep all follow-up visits as told by your health care provider. This is important. Contact a health care provider if:  You cannot stop coughing.  You are not urinating.  You are urinating less than usual. Get help right away if:  You have trouble swallowing.  You cannot eat or drink.  You have throat or chest pain that gets worse.  You are dizzy or light-headed.  You faint.  You have nausea or vomiting.  You have chills.  You have a fever.  You have severe abdominal pain.  You have black, tarry, or bloody stools. This information is not intended to replace advice given to you by your health care provider. Make sure you discuss any questions you have  with your health care provider. Document Released: 08/10/2012 Document Revised: 01/30/2016 Document Reviewed: 07/18/2015 Elsevier Interactive Patient Education  2018 [employer].  Esophageal Dilatation Esophageal dilatation is a procedure to open a blocked or narrowed part of the esophagus. The esophagus is the long tube in your throat that carries food and liquid from your mouth to your stomach. The procedure is also called esophageal dilation. You may need this procedure if you have a buildup of scar tissue in your esophagus that makes it difficult, painful, or even impossible to swallow. This can be caused by gastroesophageal reflux disease (GERD). In rare cases, people need this procedure because they have cancer of the esophagus or a problem with the way food moves through the esophagus. Sometimes you may need to  have another dilatation to enlarge the opening of the esophagus gradually. Tell a health care provider about:  Any allergies you have.  All medicines you are taking, including vitamins, herbs, eye drops, creams, and over-the-counter medicines.  Any problems you or family members have had with anesthetic medicines.  Any blood disorders you have.  Any surgeries you have had.  Any medical conditions you have.  Any antibiotic medicines you are required to take before dental procedures. What are the risks? Generally, this is a safe procedure. However, problems can occur and include:  Bleeding from a tear in the lining of the esophagus.  A hole (perforation) in the esophagus.  What happens before the procedure?  Do not eat or drink anything after midnight on the night before the procedure or as directed by your health care provider.  Ask your health care provider about changing or stopping your regular medicines. This is especially important if you are taking diabetes medicines or blood thinners.  Plan to have someone take you home after the procedure. What happens during the procedure?  You will be given a medicine that makes you relaxed and sleepy (sedative).  A medicine may be sprayed or gargled to numb the back of the throat.  Your health care provider can use various instruments to do an esophageal dilatation. During the procedure, the instrument used will be placed in your mouth and passed down into your esophagus. Options include: ? Simple dilators. This instrument is carefully placed in the esophagus to stretch it. ? Guided wire bougies. In this method, a flexible tube (endoscope) is used to insert a wire into the esophagus. The dilator is passed over this wire to enlarge the esophagus. Then the wire is removed. ? Balloon dilators. An endoscope with a small balloon at the end is passed down into the esophagus. Inflating the balloon gently stretches the esophagus and opens it  up. What happens after the procedure?  Your blood pressure, heart rate, breathing rate, and blood oxygen level will be monitored often until the medicines you were given have worn off.  Your throat may feel slightly sore and will probably still feel numb. This will improve slowly over time.  You will not be allowed to eat or drink until the throat numbness has resolved.  If this is a same-day procedure, you may be allowed to go home once you have been able to drink, urinate, and sit on the edge of the bed without nausea or dizziness.  If this is a same-day procedure, you should have a friend or family member with you for the next 24 hours after the procedure. This information is not intended to replace advice given to you by your health care provider.  Make sure you discuss any questions you have with your health care provider. Document Released: 10/15/2005 Document Revised: 01/30/2016 Document Reviewed: 01/03/2014 Elsevier Interactive Patient Education  2017 [place] Anesthesia is a term that refers to techniques, procedures, and medicines that help a person stay safe and comfortable during a medical procedure. Monitored anesthesia care, or sedation, is one type of anesthesia. Your anesthesia specialist may recommend sedation if you will be having a procedure that does not require you to be unconscious, such as:  Cataract surgery.  A dental procedure.  A biopsy.  A colonoscopy.  During the procedure, you may receive a medicine to help you relax (sedative). There are three levels of sedation:  Mild sedation. At this level, you may feel awake and relaxed. You will be able to follow directions.  Moderate sedation. At this level, you will be sleepy. You may not remember the procedure.  Deep sedation. At this level, you will be asleep. You will not remember the procedure.  The more medicine you are given, the deeper your level of sedation will be.  Depending on how you respond to the procedure, the anesthesia specialist may change your level of sedation or the type of anesthesia to fit your needs. An anesthesia specialist will monitor you closely during the procedure. Let your health care provider know about:  Any allergies you have.  All medicines you are taking, including vitamins, herbs, eye drops, creams, and over-the-counter medicines.  Any use of steroids (by mouth or as a cream).  Any problems you or family members have had with sedatives and anesthetic medicines.  Any blood disorders you have.  Any surgeries you have had.  Any medical conditions you have, such as sleep apnea.  Whether you are pregnant or may be pregnant.  Any use of cigarettes, alcohol, or street drugs. What are the risks? Generally, this is a safe procedure. However, problems may occur, including:  Getting too much medicine (oversedation).  Nausea.  Allergic reaction to medicines.  Trouble breathing. If this happens, a breathing tube may be used to help with breathing. It will be removed when you are awake and breathing on your own.  Heart trouble.  Lung trouble.  Before the procedure Staying hydrated Follow instructions from your health care provider about hydration, which may include:  Up to 2 hours before the procedure - you may continue to drink clear liquids, such as water, clear fruit juice, black coffee, and plain tea.  Eating and drinking restrictions Follow instructions from your health care provider about eating and drinking, which may include:  8 hours before the procedure - stop eating heavy meals or foods such as meat, fried foods, or fatty foods.  6 hours before the procedure - stop eating light meals or foods, such as toast or cereal.  6 hours before the procedure - stop drinking milk or drinks that contain milk.  2 hours before the procedure - stop drinking clear liquids.  Medicines Ask your health care provider  about:  Changing or stopping your regular medicines. This is especially important if you are taking diabetes medicines or blood thinners.  Taking medicines such as aspirin and ibuprofen. These medicines can thin your blood. Do not take these medicines before your procedure if your health care provider instructs you not to.  Tests and exams  You will have a physical exam.  You may have blood tests done to show: ? How well your kidneys and liver are working. ?  How well your blood can clot.  General instructions  Plan to have someone take you home from the hospital or clinic.  If you will be going home right after the procedure, plan to have someone with you for 24 hours.  What happens during the procedure?  Your blood pressure, heart rate, breathing, level of pain and overall condition will be monitored.  An IV tube will be inserted into one of your veins.  Your anesthesia specialist will give you medicines as needed to keep you comfortable during the procedure. This may mean changing the level of sedation.  The procedure will be performed. After the procedure  Your blood pressure, heart rate, breathing rate, and blood oxygen level will be monitored until the medicines you were given have worn off.  Do not drive for 24 hours if you received a sedative.  You may: ? Feel sleepy, clumsy, or nauseous. ? Feel forgetful about what happened after the procedure. ? Have a sore throat if you had a breathing tube during the procedure. ? Vomit. This information is not intended to replace advice given to you by your health care provider. Make sure you discuss any questions you have with your health care provider. Document Released: 05/20/2005 Document Revised: 01/31/2016 Document Reviewed: 12/15/2015 Elsevier Interactive Patient Education  2018 [place], Care After These instructions provide you with information about caring for yourself after your  procedure. Your health care provider may also give you more specific instructions. Your treatment has been planned according to current medical practices, but problems sometimes occur. Call your health care provider if you have any problems or questions after your procedure. What can I expect after the procedure? After your procedure, it is common to:  Feel sleepy for several hours.  Feel clumsy and have poor balance for several hours.  Feel forgetful about what happened after the procedure.  Have poor judgment for several hours.  Feel nauseous or vomit.  Have a sore throat if you had a breathing tube during the procedure.  Follow these instructions at home: For at least 24 hours after the procedure:   Do not: ? Participate in activities in which you could fall or become injured. ? Drive. ? Use heavy machinery. ? Drink alcohol. ? Take sleeping pills or medicines that cause drowsiness. ? Make important decisions or sign legal documents. ? Take care of children on your own.  Rest. Eating and drinking  Follow the diet that is recommended by your health care provider.  If you vomit, drink water, juice, or soup when you can drink without vomiting.  Make sure you have little or no nausea before eating solid foods. General instructions  Have a responsible adult stay with you until you are awake and alert.  Take over-the-counter and prescription medicines only as told by your health care provider.  If you smoke, do not smoke without supervision.  Keep all follow-up visits as told by your health care provider. This is important. Contact a health care provider if:  You keep feeling nauseous or you keep vomiting.  You feel light-headed.  You develop a rash.  You have a fever. Get help right away if:  You have trouble breathing. This information is not intended to replace advice given to you by your health care provider. Make sure you discuss any questions you have with  your health care provider. Document Released: 12/15/2015 Document Revised: 04/15/2016 Document Reviewed: 12/15/2015 Elsevier Interactive Patient Education  [employer].

## 2017-07-02 ENCOUNTER — Encounter (HOSPITAL_COMMUNITY): Payer: Self-pay

## 2017-07-02 ENCOUNTER — Encounter (HOSPITAL_COMMUNITY)
Admission: RE | Admit: 2017-07-02 | Discharge: 2017-07-02 | Disposition: A | Discharge status: Home / Self Care | Payer: Medicare PPO | Source: Ambulatory Visit | Attending: Internal Medicine | Admitting: Internal Medicine

## 2017-07-02 DIAGNOSIS — Z79891 Long term (current) use of opiate analgesic: Secondary | ICD-10-CM | POA: Insufficient documentation

## 2017-07-02 DIAGNOSIS — J449 Chronic obstructive pulmonary disease, unspecified: Secondary | ICD-10-CM | POA: Diagnosis not present

## 2017-07-02 DIAGNOSIS — Z87891 Personal history of nicotine dependence: Secondary | ICD-10-CM | POA: Insufficient documentation

## 2017-07-02 DIAGNOSIS — Z8673 Personal history of transient ischemic attack (TIA), and cerebral infarction without residual deficits: Secondary | ICD-10-CM | POA: Diagnosis not present

## 2017-07-02 DIAGNOSIS — Z01812 Encounter for preprocedural laboratory examination: Secondary | ICD-10-CM | POA: Diagnosis present

## 2017-07-02 DIAGNOSIS — Z79899 Other long term (current) drug therapy: Secondary | ICD-10-CM | POA: Insufficient documentation

## 2017-07-02 DIAGNOSIS — R001 Bradycardia, unspecified: Secondary | ICD-10-CM | POA: Insufficient documentation

## 2017-07-02 DIAGNOSIS — Z01810 Encounter for preprocedural cardiovascular examination: Secondary | ICD-10-CM | POA: Insufficient documentation

## 2017-07-02 DIAGNOSIS — R1310 Dysphagia, unspecified: Secondary | ICD-10-CM | POA: Diagnosis not present

## 2017-07-08 ENCOUNTER — Ambulatory Visit (HOSPITAL_COMMUNITY): Payer: Medicare PPO | Admitting: Anesthesiology

## 2017-07-08 ENCOUNTER — Encounter (HOSPITAL_COMMUNITY): Payer: Self-pay | Admitting: *Deleted

## 2017-07-08 ENCOUNTER — Encounter (HOSPITAL_COMMUNITY)
Admission: RE | Disposition: A | Discharge status: Home / Self Care | Payer: Self-pay | Source: Ambulatory Visit | Attending: Internal Medicine

## 2017-07-08 ENCOUNTER — Ambulatory Visit (HOSPITAL_COMMUNITY)
Admission: RE | Admit: 2017-07-08 | Discharge: 2017-07-08 | Disposition: A | Discharge status: Home / Self Care | Payer: Medicare PPO | Source: Ambulatory Visit | Attending: Internal Medicine | Admitting: Internal Medicine

## 2017-07-08 DIAGNOSIS — J449 Chronic obstructive pulmonary disease, unspecified: Secondary | ICD-10-CM | POA: Diagnosis not present

## 2017-07-08 DIAGNOSIS — R1310 Dysphagia, unspecified: Secondary | ICD-10-CM

## 2017-07-08 DIAGNOSIS — K209 Esophagitis, unspecified: Secondary | ICD-10-CM | POA: Diagnosis not present

## 2017-07-08 DIAGNOSIS — Z8673 Personal history of transient ischemic attack (TIA), and cerebral infarction without residual deficits: Secondary | ICD-10-CM | POA: Diagnosis not present

## 2017-07-08 DIAGNOSIS — I10 Essential (primary) hypertension: Secondary | ICD-10-CM | POA: Diagnosis not present

## 2017-07-08 DIAGNOSIS — M353 Polymyalgia rheumatica: Secondary | ICD-10-CM | POA: Insufficient documentation

## 2017-07-08 DIAGNOSIS — K5900 Constipation, unspecified: Secondary | ICD-10-CM | POA: Insufficient documentation

## 2017-07-08 DIAGNOSIS — Z7902 Long term (current) use of antithrombotics/antiplatelets: Secondary | ICD-10-CM | POA: Diagnosis not present

## 2017-07-08 DIAGNOSIS — Z79899 Other long term (current) drug therapy: Secondary | ICD-10-CM | POA: Diagnosis not present

## 2017-07-08 DIAGNOSIS — E782 Mixed hyperlipidemia: Secondary | ICD-10-CM | POA: Insufficient documentation

## 2017-07-08 DIAGNOSIS — K210 Gastro-esophageal reflux disease with esophagitis: Secondary | ICD-10-CM | POA: Diagnosis not present

## 2017-07-08 DIAGNOSIS — K222 Esophageal obstruction: Secondary | ICD-10-CM | POA: Insufficient documentation

## 2017-07-08 DIAGNOSIS — Z87891 Personal history of nicotine dependence: Secondary | ICD-10-CM | POA: Insufficient documentation

## 2017-07-08 DIAGNOSIS — K2210 Ulcer of esophagus without bleeding: Secondary | ICD-10-CM | POA: Diagnosis not present

## 2017-07-08 DIAGNOSIS — F329 Major depressive disorder, single episode, unspecified: Secondary | ICD-10-CM | POA: Diagnosis not present

## 2017-07-08 HISTORY — PX: BIOPSY: SHX5522

## 2017-07-08 HISTORY — PX: ESOPHAGOGASTRODUODENOSCOPY (EGD) WITH PROPOFOL: SHX5813

## 2017-07-08 HISTORY — PX: MALONEY DILATION: SHX5535

## 2017-07-08 SURGERY — ESOPHAGOGASTRODUODENOSCOPY (EGD) WITH PROPOFOL
Anesthesia: Monitor Anesthesia Care

## 2017-07-08 MED ORDER — PROPOFOL 10 MG/ML IV BOLUS
INTRAVENOUS | Status: DC | PRN
  Administered 2017-07-08: 20 mg via INTRAVENOUS
  Administered 2017-07-08: 10 mg via INTRAVENOUS

## 2017-07-08 MED ORDER — PROPOFOL 10 MG/ML IV BOLUS
INTRAVENOUS | Status: AC
  Filled 2017-07-08: qty 40

## 2017-07-08 MED ORDER — PROPOFOL 500 MG/50ML IV EMUL
INTRAVENOUS | Status: DC | PRN
  Administered 2017-07-08: 100 ug/kg/min via INTRAVENOUS

## 2017-07-08 MED ORDER — CHLORHEXIDINE GLUCONATE CLOTH 2 % EX PADS: 6.0000 | MEDICATED_PAD | Freq: Once | CUTANEOUS | Status: DC

## 2017-07-08 MED ORDER — MIDAZOLAM HCL 2 MG/2ML IJ SOLN
1.0000 mg | INTRAMUSCULAR | Status: AC
  Administered 2017-07-08: 2 mg via INTRAVENOUS
  Filled 2017-07-08: qty 2

## 2017-07-08 MED ORDER — LIDOCAINE VISCOUS 2 % MT SOLN
6.0000 mL | Freq: Once | OROMUCOSAL | Status: AC
  Administered 2017-07-08: 6 mL via OROMUCOSAL

## 2017-07-08 MED ORDER — LACTATED RINGERS IV SOLN
INTRAVENOUS | Status: DC
  Administered 2017-07-08: 07:00:00 via INTRAVENOUS

## 2017-07-08 MED ORDER — FENTANYL CITRATE (PF) 100 MCG/2ML IJ SOLN: 25.0000 ug | Freq: Once | INTRAMUSCULAR | Status: DC

## 2017-07-08 MED ORDER — LIDOCAINE VISCOUS 2 % MT SOLN
OROMUCOSAL | Status: AC
  Filled 2017-07-08: qty 15

## 2017-07-08 NOTE — Anesthesia Postprocedure Evaluation (Signed)
Anesthesia Post Note  Patient: Jimmy Franklin  Procedure(s) Performed: ESOPHAGOGASTRODUODENOSCOPY (EGD) WITH PROPOFOL (N/A ) MALONEY DILATION (N/A ) BIOPSY  Patient location during evaluation: PACU Anesthesia Type: MAC Level of consciousness: awake and alert and oriented Pain management: pain level controlled Vital Signs Assessment: post-procedure vital signs reviewed and stable Respiratory status: spontaneous breathing Cardiovascular status: blood pressure returned to baseline and stable Postop Assessment: no apparent nausea or vomiting Anesthetic complications: no     Last Vitals:  Vitals:   07/08/17 0815 07/08/17 0820  BP: 130/81   Pulse: (!) 49 (!) 50  Resp: 15 16  Temp:    SpO2:  96%    Last Pain:  Vitals:   07/08/17 0633  TempSrc: Oral                 Raegan Winders

## 2017-07-08 NOTE — H&P (View-Only) (Signed)
Primary Care Physician:  Caryl Bis, MD Primary Gastroenterologist:  Dr. Gala Romney   Chief Complaint  Patient presents with  . Dysphagia    HPI:   Jimmy Franklin is a 83 y.o. male presenting today at the request of Caryl Bis, MD secondary to dysphagia.   He states that he had a stroke 5 years ago and was hospitalized at [facility], and since that time he has had problems swallowing. Daughter present and states speech evaluated him many years ago. Lately swallowing has worsened. Sometimes gets so choked that daugher fears he may aspirate. Issues with cornbread, crackers, spicy foods, popcorn. No difficulty with meat. Just things with crumbs/grains. Getting strangled on saliva as well. Unable to find information in care everywhere regarding any speech evaluation.   No abdominal pain. No unexplained weight loss or lack of appetite. Last colonoscopy about 10 years ago at [facility]. Believes he may have had a few polyps. Miralax helps with constipation. No PPI. Only on ranitidine.   Past Medical History:  Diagnosis Date  . Bowel habit changes 03/21/2011  . Chronic constipation 08  . COPD (chronic obstructive pulmonary disease) ([place])   . Depression   . Foley catheter in place 03-17-13  . GERD (gastroesophageal reflux disease)   . Hearing loss of both ears   . Hepatitis    20 yrs ago  . Hx of colonic polyps   . Mixed hyperlipidemia   . Polymyalgia rheumatica ([place])   . Seizures ([place])    caused by UTI-none recent- over 1 yr  . Stroke [facility])    CVA- 3 yrs ago-left sided weakness, mild residual  . Unspecified essential hypertension     Past Surgical History:  Procedure Laterality Date  . CATARACT EXTRACTION, BILATERAL    . CHOLECYSTECTOMY     lap. gallbladder removal  . COLONOSCOPY  07/04/07  . TONSILLECTOMY    . TRANSURETHRAL INCISION OF BLADDER NECK N/A 01/24/2015   Procedure: TRANSURETHRAL INCISION OF BLADDER NECK;  Surgeon: Franchot Gallo, MD;  Location: [facility];   Service: Urology;  Laterality: N/A;  **TRANSURETHRAL RESECTION BLADDER NECK CONTRACTURE**     . TRANSURETHRAL RESECTION OF PROSTATE N/A 03/23/2013   Procedure: TRANSURETHRAL RESECTION OF THE PROSTATE (TURP);  Surgeon: Franchot Gallo, MD;  Location: [facility];  Service: Urology;  Laterality: N/A;    Current Outpatient Prescriptions  Medication Sig Dispense Refill  . clonazePAM (KLONOPIN) 0.5 MG tablet Take 0.5 mg by mouth daily.    . clopidogrel (PLAVIX) 75 MG tablet Take 75 mg by mouth daily.    Marland Kitchen escitalopram (LEXAPRO) 20 MG tablet Take 20 mg by mouth daily.    . furosemide (LASIX) 20 MG tablet Take 20 mg by mouth every morning.     Marland Kitchen liothyronine (CYTOMEL) 25 MCG tablet Take 25 mcg by mouth daily.    Marland Kitchen lisinopril (PRINIVIL,ZESTRIL) 20 MG tablet Take 20 mg by mouth every morning.     . metoprolol succinate (TOPROL-XL) 50 MG 24 hr tablet Take 50 mg by mouth 2 (two) times daily at 10 AM and 5 PM. Take with or immediately following a meal.    . phenytoin (DILANTIN) 100 MG ER capsule Take 200 mg by mouth 2 (two) times daily. 2 in the morning and 1 in the evening    . polyethylene glycol powder (GLYCOLAX/MIRALAX) powder Take 17 g by mouth every morning.     . ranitidine (ZANTAC) 300 MG tablet Take 300 mg by mouth at bedtime.    Marland Kitchen  simvastatin (ZOCOR) 10 MG tablet Take 40 mg by mouth at bedtime.     . traMADol (ULTRAM) 50 MG tablet Take 50 mg by mouth 3 (three) times daily.     No current facility-administered medications for this visit.     Allergies as of 06/09/2017  . (No Known Allergies)    No family history on file.  Social History   Social History  . Marital status: Married    Spouse name: N/A  . Number of children: N/A  . Years of education: N/A   Occupational History  . Not on file.   Social History Main Topics  . Smoking status: Former Smoker    Quit date: 03/18/1967  . Smokeless tobacco: Former Systems developer  . Alcohol use No  . Drug use: No  . Sexual activity: Not Currently    Other Topics Concern  . Not on file   Social History Narrative  . No narrative on file    Review of Systems: Gen: Denies any fever, chills, fatigue, weight loss, lack of appetite.  CV: Denies chest pain, heart palpitations, peripheral edema, syncope.  Resp: Denies shortness of breath at rest or with exertion. Denies wheezing or cough.  GI: see HPI  GU : Denies urinary burning, urinary frequency, urinary hesitancy MS: Denies joint pain, muscle weakness, cramps, or limitation of movement.  Derm: Denies rash, itching, dry skin Psych: Denies depression, anxiety, memory loss, and confusion Heme: see HPI   Physical Exam: BP 105/61   Pulse (!) 57   Temp 98.1 F (36.7 C) (Oral)   Ht '6\' 1"'$  (1.854 m)   Wt 206 lb 6.4 oz (93.6 kg)   BMI 27.23 kg/m  General:   Alert and oriented. Pleasant and cooperative. Well-nourished and well-developed.  Head:  Normocephalic and atraumatic. Eyes:  Without icterus, sclera clear and conjunctiva pink.  Ears:  Normal auditory acuity. Nose:  No deformity, discharge,  or lesions. Mouth:  No deformity or lesions, oral mucosa pink.  Lungs:  Clear to auscultation bilaterally. No wheezes, rales, or rhonchi. No distress.  Heart:  S1, S2 present without murmurs appreciated.  Abdomen:  +BS, soft, non-tender and non-distended. No HSM noted. No guarding or rebound. No masses appreciated. Small umbilical hernia  Rectal:  Deferred  Msk:  Kyphosis  Extremities:  Without  edema. Neurologic:  Alert and  oriented x4 Psych:  Alert and cooperative. Normal mood and affect.  Outside labs July 2018: Hgb 14.2, Hct 43.6, Tbili 0.3, Alk Phos 115, AST 16, ALT 13

## 2017-07-08 NOTE — Interval H&P Note (Signed)
History and Physical Interval Note:  07/08/2017 7:25 AM  Jimmy Franklin  has presented today for surgery, with the diagnosis of dysphagia  The various methods of treatment have been discussed with the patient and family. After consideration of risks, benefits and other options for treatment, the patient has consented to  Procedure(s) with comments: ESOPHAGOGASTRODUODENOSCOPY (EGD) WITH PROPOFOL (N/A) - 7:30am MALONEY DILATION (N/A) as a surgical intervention .  The patient's history has been reviewed, patient examined, no change in status, stable for surgery.  I have reviewed the patient's chart and labs.  Questions were answered to the patient's satisfaction.     Jimmy Franklin  No change. EGD with possible ED  per plan.  The risks, benefits, limitations, alternatives and imponderables have been reviewed with the patient. Potential for esophageal dilation, biopsy, etc. have also been reviewed.  Questions have been answered. All parties agreeable.

## 2017-07-08 NOTE — Discharge Instructions (Signed)
EGD Discharge instructions Please read the instructions outlined below and refer to this sheet in the next few weeks. These discharge instructions provide you with general information on caring for yourself after you leave the hospital. Your doctor may also give you specific instructions. While your treatment has been planned according to the most current medical practices available, unavoidable complications occasionally occur. If you have any problems or questions after discharge, please call your doctor. ACTIVITY  You may resume your regular activity but move at a slower pace for the next 24 hours.   Take frequent rest periods for the next 24 hours.   Walking will help expel (get rid of) the air and reduce the bloated feeling in your abdomen.   No driving for 24 hours (because of the anesthesia (medicine) used during the test).   You may shower.   Do not sign any important legal documents or operate any machinery for 24 hours (because of the anesthesia used during the test).  NUTRITION  Drink plenty of fluids.   You may resume your normal diet.   Begin with a light meal and progress to your normal diet.   Avoid alcoholic beverages for 24 hours or as instructed by your caregiver.  MEDICATIONS  You may resume your normal medications unless your caregiver tells you otherwise.  WHAT YOU CAN EXPECT TODAY  You may experience abdominal discomfort such as a feeling of fullness or gas pains.  FOLLOW-UP  Your doctor will discuss the results of your test with you.  SEEK IMMEDIATE MEDICAL ATTENTION IF ANY OF THE FOLLOWING OCCUR:  Excessive nausea (feeling sick to your stomach) and/or vomiting.   Severe abdominal pain and distention (swelling).   Trouble swallowing.   Temperature over 101 F (37.8 C).   Rectal bleeding or vomiting of blood.    Gastroesophageal Reflux Disease, Adult Normally, food travels down the esophagus and stays in the stomach to be digested. However,  when a person has gastroesophageal reflux disease (GERD), food and stomach acid move back up into the esophagus. When this happens, the esophagus becomes sore and inflamed. Over time, GERD can create small holes (ulcers) in the lining of the esophagus. What are the causes? This condition is caused by a problem with the muscle between the esophagus and the stomach (lower esophageal sphincter, or LES). Normally, the LES muscle closes after food passes through the esophagus to the stomach. When the LES is weakened or abnormal, it does not close properly, and that allows food and stomach acid to go back up into the esophagus. The LES can be weakened by certain dietary substances, medicines, and medical conditions, including:  Tobacco use.  Pregnancy.  Having a hiatal hernia.  Heavy alcohol use.  Certain foods and beverages, such as coffee, chocolate, onions, and peppermint.  What increases the risk? This condition is more likely to develop in:  People who have an increased body weight.  People who have connective tissue disorders.  People who use NSAID medicines.  What are the signs or symptoms? Symptoms of this condition include:  Heartburn.  Difficult or painful swallowing.  The feeling of having a lump in the throat.  Abitter taste in the mouth.  Bad breath.  Having a large amount of saliva.  Having an upset or bloated stomach.  Belching.  Chest pain.  Shortness of breath or wheezing.  Ongoing (chronic) cough or a night-time cough.  Wearing away of tooth enamel.  Weight loss.  Different conditions can cause chest pain.  Make sure to see your health care provider if you experience chest pain. How is this diagnosed? Your health care provider will take a medical history and perform a physical exam. To determine if you have mild or severe GERD, your health care provider may also monitor how you respond to treatment. You may also have other tests, including:  An  endoscopy toexamine your stomach and esophagus with a small camera.  A test thatmeasures the acidity level in your esophagus.  A test thatmeasures how much pressure is on your esophagus.  A barium swallow or modified barium swallow to show the shape, size, and functioning of your esophagus.  How is this treated? The goal of treatment is to help relieve your symptoms and to prevent complications. Treatment for this condition may vary depending on how severe your symptoms are. Your health care provider may recommend:  Changes to your diet.  Medicine.  Surgery.  Follow these instructions at home: Diet  Follow a diet as recommended by your health care provider. This may involve avoiding foods and drinks such as: ? Coffee and tea (with or without caffeine). ? Drinks that containalcohol. ? Energy drinks and sports drinks. ? Carbonated drinks or sodas. ? Chocolate and cocoa. ? Peppermint and mint flavorings. ? Garlic and onions. ? Horseradish. ? Spicy and acidic foods, including peppers, chili powder, curry powder, vinegar, hot sauces, and barbecue sauce. ? Citrus fruit juices and citrus fruits, such as oranges, lemons, and limes. ? Tomato-based foods, such as red sauce, chili, salsa, and pizza with red sauce. ? Fried and fatty foods, such as donuts, french fries, potato chips, and high-fat dressings. ? High-fat meats, such as hot dogs and fatty cuts of red and white meats, such as rib eye steak, sausage, ham, and bacon. ? High-fat dairy items, such as whole milk, butter, and cream cheese.  Eat small, frequent meals instead of large meals.  Avoid drinking large amounts of liquid with your meals.  Avoid eating meals during the 2-3 hours before bedtime.  Avoid lying down right after you eat.  Do not exercise right after you eat. General instructions  Pay attention to any changes in your symptoms.  Take over-the-counter and prescription medicines only as told by your health  care provider. Do not take aspirin, ibuprofen, or other NSAIDs unless your health care provider told you to do so.  Do not use any tobacco products, including cigarettes, chewing tobacco, and e-cigarettes. If you need help quitting, ask your health care provider.  Wear loose-fitting clothing. Do not wear anything tight around your waist that causes pressure on your abdomen.  Raise (elevate) the head of your bed 6 inches (15cm).  Try to reduce your stress, such as with yoga or meditation. If you need help reducing stress, ask your health care provider.  If you are overweight, reduce your weight to an amount that is healthy for you. Ask your health care provider for guidance about a safe weight loss goal.  Keep all follow-up visits as told by your health care provider. This is important. Contact a health care provider if:  You have new symptoms.  You have unexplained weight loss.  You have difficulty swallowing, or it hurts to swallow.  You have wheezing or a persistent cough.  Your symptoms do not improve with treatment.  You have a hoarse voice. Get help right away if:  You have pain in your arms, neck, jaw, teeth, or back.  You feel sweaty, dizzy, or light-headed.  You have chest pain or shortness of breath.  You vomit and your vomit looks like blood or coffee grounds.  You faint.  Your stool is bloody or black.  You cannot swallow, drink, or eat. This information is not intended to replace advice given to you by your health care provider. Make sure you discuss any questions you have with your health care provider. Document Released: 06/03/2005 Document Revised: 01/22/2016 Document Reviewed: 12/19/2014 Elsevier Interactive Patient Education  2017 [employer]  GERD information provided  Begin Protonix 40 mg twice daily 1 month then decrease to 40 mg daily thereafter.  Office visit with [place] in 3 months  Further recommendations to follow pending review of pathology  report

## 2017-07-08 NOTE — Transfer of Care (Signed)
Immediate Anesthesia Transfer of Care Note  Patient: Jimmy Franklin  Procedure(s) Performed: ESOPHAGOGASTRODUODENOSCOPY (EGD) WITH PROPOFOL (N/A ) MALONEY DILATION (N/A ) BIOPSY  Patient Location: PACU  Anesthesia Type:MAC  Level of Consciousness: awake  Airway & Oxygen Therapy: Patient Spontanous Breathing and Patient connected to face mask oxygen  Post-op Assessment: Report given to RN  Post vital signs: Reviewed and stable  Last Vitals:  Vitals:   07/08/17 0700 07/08/17 0715  BP: 126/76 133/72  Resp: 16 11  Temp:    SpO2: 96% 96%    Last Pain:  Vitals:   07/08/17 0633  TempSrc: Oral      Patients Stated Pain Goal: 5 (90/21/11 5520)  Complications: No apparent anesthesia complications

## 2017-07-08 NOTE — Op Note (Signed)
[facility] Patient Name: Jimmy Franklin Procedure Date: 07/08/2017 7:17 AM MRN: [id number] Date of Birth: 01-26-1934 Attending MD: Norvel Richards , MD CSN: [id number] Age: 83 Admit Type: Outpatient Procedure:                Upper GI endoscopy Indications:              Dysphagia Providers:                Norvel Richards, MD, Janeece Riggers, RN, Lurline Del, RN Referring MD:              Medicines:                Propofol per Anesthesia Complications:            No immediate complications. Estimated Blood Loss:     Estimated blood loss was minimal. Procedure:                Pre-Anesthesia Assessment:                           - Prior to the procedure, a History and Physical                            was performed, and patient medications and                            allergies were reviewed. The patient's tolerance of                            previous anesthesia was also reviewed. The risks                            and benefits of the procedure and the sedation                            options and risks were discussed with the patient.                            All questions were answered, and informed consent                            was obtained. Prior Anticoagulants: The patient has                            taken no previous anticoagulant or antiplatelet                            agents. ASA Grade Assessment: III - A patient with                            severe systemic disease. After reviewing the risks  and benefits, the patient was deemed in                            satisfactory condition to undergo the procedure.                           After obtaining informed consent, the endoscope was                            passed under direct vision. Throughout the                            procedure, the patient's blood pressure, pulse, and                            oxygen saturations were monitored  continuously. The                            EG-[id number] ([id number]) scope was introduced through the                            and advanced to the second part of duodenum. The                            upper GI endoscopy was accomplished without                            difficulty. The patient tolerated the procedure                            well. Scope In: 9:41:74 AM Scope Out: 7:47:10 AM Total Procedure Duration: 0 hours 7 minutes 52 seconds  Findings:      ulcerative reflux esophagitis was found. 5 mm ulcer with surrounding       erosions straddling the GE junction. No Barrett's epithelium seen no       tumor seen.      A Schatzki ring was found the GE junction..      Diffuse erythematous mucosa was found in the entire examined stomach.      The duodenal bulb and second portion of the duodenum were normal. The       scope was withdrawn. Dilation was performed with a Maloney dilator with       no resistance at 25 Fr. Dilation was performed with a Maloney dilator       with mild resistance at 56 Fr. A A Look back revealed no apparent       complication. There was no bleeding associated with this maneuver. This       was biopsied with a cold forceps for histology. Estimated blood loss was       minimal. Impression:               - Ulcerative RefluxEsophagitis.                           - Schatzki ring. Dilated. - Erythematouseroded  gastric mucosa in the stomach. Biopsied.                           - Normal duodenal bulb and second portion of the                            duodenum. Moderate Sedation:      Moderate (conscious) sedation was personally administered by an       anesthesia professional. The following parameters were monitored: oxygen       saturation, heart rate, blood pressure, respiratory rate, EKG, adequacy       of pulmonary ventilation, and response to care. Total physician       intraservice time was 13 minutes. Recommendation:            - Patient has a contact number available for                            emergencies. The signs and symptoms of potential                            delayed complications were discussed with the                            patient. Return to normal activities tomorrow.                            Written discharge instructions were provided to the                            patient.                           - Resume previous diet.                           - Continue present medications. Protonix 40 mg                            twice daily -1 month and drop back to Protonix 40                            mg daily indefinitely thereafter.                           - Await pathology results.                           - Patient has a contact number available for                            emergencies. The signs and symptoms of potential                            delayed complications were discussed with the  patient. Return to normal activities tomorrow.                            Written discharge instructions were provided to the                            patient.                           - Continue present medications.                           - No repeat upper endoscopy.                           - Return to GI office in 3 months. Procedure Code(s):        --- Professional ---                           [phone], Esophagogastroduodenoscopy, flexible,                            transoral; with biopsy, single or multiple                           43450, Dilation of esophagus, by unguided sound or                            bougie, single or multiple passes Diagnosis Code(s):        --- Professional ---                           K20.9, Esophagitis, unspecified                           K22.2, Esophageal obstruction                           K31.89, Other diseases of stomach and duodenum                           R13.10, Dysphagia, unspecified CPT copyright 2016  American Medical Association. All rights reserved. The codes documented in this report are preliminary and upon coder review may  be revised to meet current compliance requirements. [name]. Dell Hurtubise, MD Norvel Richards, MD 07/08/2017 8:05:00 AM This report has been signed electronically. Number of Addenda: 0

## 2017-07-08 NOTE — Anesthesia Preprocedure Evaluation (Signed)
Anesthesia Evaluation  Patient identified by MRN, date of birth, ID band Patient awake    Reviewed: Allergy & Precautions, H&P , NPO status , Patient's Chart, lab work & pertinent test results  Airway Mallampati: II  TM Distance: >3 FB Neck ROM: Full    Dental  (+) Teeth Intact, Poor Dentition, Missing, Chipped,    Pulmonary COPD, former smoker,    Pulmonary exam normal breath sounds clear to auscultation       Cardiovascular hypertension, Pt. on home beta blockers and Pt. on medications Normal cardiovascular exam Rhythm:Regular Rate:Normal     Neuro/Psych Seizures -,  PSYCHIATRIC DISORDERS Depression  Neuromuscular disease CVA, Residual Symptoms negative psych ROS   GI/Hepatic negative GI ROS, Neg liver ROS, GERD  ,(+) Hepatitis -, Unspecified  Endo/Other  negative endocrine ROS  Renal/GU negative Renal ROS  negative genitourinary   Musculoskeletal negative musculoskeletal ROS (+)   Abdominal   Peds  Hematology negative hematology ROS (+) anemia , hgb 10.4   Anesthesia Other Findings   Reproductive/Obstetrics                             Anesthesia Physical Anesthesia Plan  ASA: IV  Anesthesia Plan: MAC   Post-op Pain Management:    Induction: Intravenous  PONV Risk Score and Plan:   Airway Management Planned: Simple Face Mask  Additional Equipment:   Intra-op Plan:   Post-operative Plan:   Informed Consent: I have reviewed the patients History and Physical, chart, labs and discussed the procedure including the risks, benefits and alternatives for the proposed anesthesia with the patient or authorized representative who has indicated his/her understanding and acceptance.     Plan Discussed with:   Anesthesia Plan Comments:         Anesthesia Quick Evaluation

## 2017-07-11 ENCOUNTER — Encounter: Payer: Self-pay | Admitting: Internal Medicine

## 2017-07-12 ENCOUNTER — Encounter (HOSPITAL_COMMUNITY): Payer: Self-pay | Admitting: Internal Medicine

## 2017-07-23 ENCOUNTER — Ambulatory Visit (INDEPENDENT_AMBULATORY_CARE_PROVIDER_SITE_OTHER): Payer: Medicare PPO | Admitting: Urology

## 2017-07-23 DIAGNOSIS — N401 Enlarged prostate with lower urinary tract symptoms: Secondary | ICD-10-CM | POA: Diagnosis not present

## 2017-08-25 ENCOUNTER — Ambulatory Visit (INDEPENDENT_AMBULATORY_CARE_PROVIDER_SITE_OTHER): Payer: Medicare PPO | Admitting: Urology

## 2017-08-25 DIAGNOSIS — N312 Flaccid neuropathic bladder, not elsewhere classified: Secondary | ICD-10-CM | POA: Diagnosis not present

## 2017-08-25 DIAGNOSIS — R339 Retention of urine, unspecified: Secondary | ICD-10-CM

## 2017-09-29 ENCOUNTER — Ambulatory Visit (INDEPENDENT_AMBULATORY_CARE_PROVIDER_SITE_OTHER): Payer: Medicare PPO | Admitting: Urology

## 2017-09-29 DIAGNOSIS — N401 Enlarged prostate with lower urinary tract symptoms: Secondary | ICD-10-CM | POA: Diagnosis not present

## 2017-10-11 ENCOUNTER — Encounter: Payer: Self-pay | Admitting: Internal Medicine

## 2017-10-11 ENCOUNTER — Ambulatory Visit: Payer: Medicare PPO | Admitting: Nurse Practitioner

## 2017-10-29 ENCOUNTER — Ambulatory Visit (INDEPENDENT_AMBULATORY_CARE_PROVIDER_SITE_OTHER): Payer: Medicare PPO | Admitting: Urology

## 2017-10-29 DIAGNOSIS — N401 Enlarged prostate with lower urinary tract symptoms: Secondary | ICD-10-CM | POA: Diagnosis not present

## 2017-11-25 ENCOUNTER — Ambulatory Visit: Payer: Medicare PPO | Admitting: Nurse Practitioner

## 2017-11-25 ENCOUNTER — Encounter: Payer: Self-pay | Admitting: Nurse Practitioner

## 2017-11-25 VITALS — BP 124/68 | HR 60 | Temp 97.0°F | Ht 73.0 in | Wt 211.6 lb

## 2017-11-25 DIAGNOSIS — R1310 Dysphagia, unspecified: Secondary | ICD-10-CM | POA: Diagnosis not present

## 2017-11-25 DIAGNOSIS — K2100 Gastro-esophageal reflux disease with esophagitis, without bleeding: Secondary | ICD-10-CM

## 2017-11-25 NOTE — Assessment & Plan Note (Signed)
The patient was referred for endoscopy due to dysphagia.  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  Today he states his symptoms are significantly improved.  They are essentially resolved.  At this point we will have him continue his current medications including Protonix 40 mg daily.  Follow-up in 1 year.  Call if any worsening symptoms before then.

## 2017-11-25 NOTE — Patient Instructions (Signed)
1. Continue taking your current medications.  Specifically, take your Protonix every day indefinitely. 2. Return for follow-up in 1 year. 3. Call us before then if you have any worsening problems or old problems that come back. 4. Call us if you have any questions or concerns.    Have a great summer!!!!    At [facility] we value your feedback. You may receive a survey about your visit today. Please share your experience as we strive to create trusing relationships with our patients to provide genuine, compassionate, quality care.

## 2017-11-25 NOTE — Assessment & Plan Note (Signed)
History of GERD and reflux esophagitis on recently completed upper endoscopy.  He took Protonix twice a day for 1 month.  He is now back to once a day dosing.  He will need to take this indefinitely.  Overall, his symptoms are significantly improved/essentially resolved.  Return for follow-up in 1 year.

## 2017-11-25 NOTE — Progress Notes (Signed)
Referring Provider: Caryl Bis, MD Primary Care Physician:  Caryl Bis, MD Primary GI:  Dr. Gala Romney  Chief Complaint  Patient presents with  . Dysphagia    f/u. Improved    HPI:   Jimmy Franklin is a 84 y.o. male who presents for post procedure follow-up on dysphasia.  The patient was last seen in our office 06/09/2017 for dysphasia.  Noted history of CVA 5 years prior and dysphasia symptoms since then.  Speech therapy evaluation many years ago.  Prior to his visit he noted his swallowing has worsened and daughter is concerned about aspiration.  Recommended EGD with possible dilation.  Possible barium pill esophagram after EGD, to be determined.  Upper endoscopy completed 07/08/2017 which found ulcerative reflux esophagitis, Schatzki's ring status post dilation, erythema and eroded gastric mucosa in the stomach status post biopsy, normal duodenum.  Surgical pathology found the biopsies to be gastric body mucosa with foveolar glands hyperplasia and intestinal metaplasia, negative for H. pylori.  Recommended continue current medications, take Protonix 40 mg twice a day for 1 month and then once a day indefinitely.  Today he states he's doing well overall. Coughing is much improved. No GERD unless he eats spicy foods. No issues with solid food dysphagia. On rotonix daily. Denies abdominal pain, N/V, hematochezia, melena, fever, chils, unintentional weight loss. Denies chest pain, dyspnea, dizziness, lightheadedness, syncope, near syncope. Denies any other upper or lower GI symptoms.  Past Medical History:  Diagnosis Date  . Bowel habit changes 03/21/2011  . Chronic constipation 08  . COPD (chronic obstructive pulmonary disease) ([place])   . Depression   . Enlarged prostate   . Foley catheter in place 03-17-13  . GERD (gastroesophageal reflux disease)   . Hearing loss of both ears   . Hepatitis    20 yrs ago, states he turned yellow   . Hx of colonic polyps   . Mixed  hyperlipidemia   . Polymyalgia rheumatica ([place])   . Seizures ([place])    caused by UTI-none recent- over  2 yr  . Stroke [facility])    CVA- 2013, left sided weakness, mild residual  . Subarachnoid hemorrhage ([place])   . Unspecified essential hypertension     Past Surgical History:  Procedure Laterality Date  . BIOPSY  07/08/2017   Procedure: BIOPSY;  Surgeon: Daneil Dolin, MD;  Location: [facility];  Service: Endoscopy;;  gastric  . CATARACT EXTRACTION, BILATERAL    . CHOLECYSTECTOMY     lap. gallbladder removal  . COLONOSCOPY  07/04/07  . ESOPHAGOGASTRODUODENOSCOPY (EGD) WITH PROPOFOL N/A 07/08/2017   Procedure: ESOPHAGOGASTRODUODENOSCOPY (EGD) WITH PROPOFOL;  Surgeon: Daneil Dolin, MD;  Location: [facility];  Service: Endoscopy;  Laterality: N/A;  7:30am  . MALONEY DILATION N/A 07/08/2017   Procedure: Venia Minks DILATION;  Surgeon: Daneil Dolin, MD;  Location: [facility];  Service: Endoscopy;  Laterality: N/A;  . TONSILLECTOMY    . TRANSURETHRAL INCISION OF BLADDER NECK N/A 01/24/2015   Procedure: TRANSURETHRAL INCISION OF BLADDER NECK;  Surgeon: Franchot Gallo, MD;  Location: [facility];  Service: Urology;  Laterality: N/A;  **TRANSURETHRAL RESECTION BLADDER NECK CONTRACTURE**     . TRANSURETHRAL RESECTION OF PROSTATE N/A 03/23/2013   Procedure: TRANSURETHRAL RESECTION OF THE PROSTATE (TURP);  Surgeon: Franchot Gallo, MD;  Location: [facility];  Service: Urology;  Laterality: N/A;    Current Outpatient Medications  Medication Sig Dispense Refill  . clonazePAM (KLONOPIN) 0.5 MG tablet Take 0.5 mg  by mouth at bedtime.     . clopidogrel (PLAVIX) 75 MG tablet Take 75 mg by mouth daily.    Marland Kitchen escitalopram (LEXAPRO) 20 MG tablet Take 20 mg by mouth daily.    . furosemide (LASIX) 20 MG tablet Take 20 mg by mouth every morning.     Marland Kitchen liothyronine (CYTOMEL) 25 MCG tablet Take 25 mcg by mouth daily.    Marland Kitchen lisinopril (PRINIVIL,ZESTRIL) 20 MG tablet Take 20 mg by mouth every morning.     .  metoprolol succinate (TOPROL-XL) 50 MG 24 hr tablet Take 50 mg by mouth 2 (two) times daily. Take with or immediately following a meal.    . phenytoin (DILANTIN) 100 MG ER capsule Take 200 mg by mouth 2 (two) times daily.     . ranitidine (ZANTAC) 300 MG tablet Take 300 mg by mouth daily.     . simvastatin (ZOCOR) 40 MG tablet Take 40 mg by mouth daily.      No current facility-administered medications for this visit.     Allergies as of 11/25/2017  . (No Known Allergies)    Family History  Problem Relation Age of Onset  . Colon cancer Mother   . Colon cancer Father     Social History   Socioeconomic History  . Marital status: Married    Spouse name: Not on file  . Number of children: Not on file  . Years of education: Not on file  . Highest education level: Not on file  Occupational History  . Not on file  Social Needs  . Financial resource strain: Not on file  . Food insecurity:    Worry: Not on file    Inability: Not on file  . Transportation needs:    Medical: Not on file    Non-medical: Not on file  Tobacco Use  . Smoking status: Former Smoker    Packs/day: 3.00    Years: 15.00    Pack years: 45.00    Types: Cigarettes    Last attempt to quit: 03/18/1967    Years since quitting: 50.7  . Smokeless tobacco: Former Network engineer and Sexual Activity  . Alcohol use: No  . Drug use: No  . Sexual activity: Not Currently  Lifestyle  . Physical activity:    Days per week: Not on file    Minutes per session: Not on file  . Stress: Not on file  Relationships  . Social connections:    Talks on phone: Not on file    Gets together: Not on file    Attends religious service: Not on file    Active member of club or organization: Not on file    Attends meetings of clubs or organizations: Not on file    Relationship status: Not on file  Other Topics Concern  . Not on file  Social History Narrative  . Not on file    Review of Systems: General: Negative for  anorexia, weight loss, fever, chills, fatigue, weakness. Eyes: Negative for vision changes.  ENT: Negative for hoarseness, difficulty swallowing , nasal congestion. CV: Negative for chest pain, angina, palpitations, dyspnea on exertion, peripheral edema.  Respiratory: Negative for dyspnea at rest, dyspnea on exertion, cough, sputum, wheezing.  GI: See history of present illness. GU:  Negative for dysuria, hematuria, urinary incontinence, urinary frequency, nocturnal urination.  MS: Negative for joint pain, low back pain.  Derm: Negative for rash or itching.  Neuro: Negative for weakness, abnormal sensation, seizure, frequent headaches, memory  loss, confusion.  Psych: Negative for anxiety, depression, suicidal ideation, hallucinations.  Endo: Negative for unusual weight change.  Heme: Negative for bruising or bleeding. Allergy: Negative for rash or hives.   Physical Exam: BP 124/68   Pulse 60   Temp (!) 97 F (36.1 C) (Oral)   Ht 6\' 1"  (1.854 m)   Wt 211 lb 9.6 oz (96 kg)   BMI 27.92 kg/m  General:   Alert and oriented. Pleasant and cooperative. Well-nourished and well-developed.  Head:  Normocephalic and atraumatic. Eyes:  Without icterus, sclera clear and conjunctiva pink.  Ears:  Normal auditory acuity. Mouth:  No deformity or lesions, oral mucosa pink.  Throat/Neck:  Supple, without mass or thyromegaly. Cardiovascular:  S1, S2 present without murmurs appreciated. Normal pulses noted. Extremities without clubbing or edema. Respiratory:  Clear to auscultation bilaterally. No wheezes, rales, or rhonchi. No distress.  Gastrointestinal:  +BS, soft, non-tender and non-distended. No HSM noted. No guarding or rebound. No masses appreciated.  Rectal:  Deferred  Musculoskalatal:  Symmetrical without gross deformities. Normal posture. Skin:  Intact without significant lesions or rashes. Neurologic:  Alert and oriented x4;  grossly normal neurologically. Psych:  Alert and cooperative.  Normal mood and affect. Heme/Lymph/Immune: No significant cervical adenopathy. No excessive bruising noted.    11/25/2017 11:08 AM   Disclaimer: This note was dictated with voice recognition software. Similar sounding words can inadvertently be transcribed and may not be corrected upon review.

## 2017-11-25 NOTE — Addendum Note (Signed)
Addended by: Gordy Levan, Janila Arrazola A on: 11/25/2017 11:17 AM   Modules accepted: Level of Service

## 2017-11-25 NOTE — Progress Notes (Signed)
cc'ed to pcp °

## 2017-11-26 ENCOUNTER — Ambulatory Visit (INDEPENDENT_AMBULATORY_CARE_PROVIDER_SITE_OTHER): Payer: Medicare PPO | Admitting: Urology

## 2017-11-26 DIAGNOSIS — N401 Enlarged prostate with lower urinary tract symptoms: Secondary | ICD-10-CM | POA: Diagnosis not present

## 2017-12-17 ENCOUNTER — Other Ambulatory Visit (HOSPITAL_COMMUNITY)
Admission: AD | Admit: 2017-12-17 | Discharge: 2017-12-17 | Disposition: A | Discharge status: Home / Self Care | Payer: Medicare HMO | Source: Other Acute Inpatient Hospital | Attending: Urology | Admitting: Urology

## 2017-12-17 ENCOUNTER — Ambulatory Visit (INDEPENDENT_AMBULATORY_CARE_PROVIDER_SITE_OTHER): Payer: Medicare HMO | Admitting: Urology

## 2017-12-17 DIAGNOSIS — N401 Enlarged prostate with lower urinary tract symptoms: Secondary | ICD-10-CM

## 2017-12-17 DIAGNOSIS — R300 Dysuria: Secondary | ICD-10-CM | POA: Diagnosis not present

## 2017-12-17 LAB — URINALYSIS, COMPLETE (UACMP) WITH MICROSCOPIC
Bilirubin Urine: NEGATIVE
GLUCOSE, UA: NEGATIVE mg/dL
Ketones, ur: NEGATIVE mg/dL
NITRITE: NEGATIVE
PH: 8 (ref 5.0–8.0)
PROTEIN: 100 mg/dL — AB
SPECIFIC GRAVITY, URINE: 1.014 (ref 1.005–1.030)
Squamous Epithelial / LPF: NONE SEEN

## 2017-12-20 LAB — URINE CULTURE

## 2018-01-04 ENCOUNTER — Ambulatory Visit (INDEPENDENT_AMBULATORY_CARE_PROVIDER_SITE_OTHER): Payer: Medicare HMO | Admitting: Urology

## 2018-01-04 DIAGNOSIS — N401 Enlarged prostate with lower urinary tract symptoms: Secondary | ICD-10-CM | POA: Diagnosis not present

## 2018-01-05 DIAGNOSIS — K219 Gastro-esophageal reflux disease without esophagitis: Secondary | ICD-10-CM | POA: Diagnosis not present

## 2018-01-05 DIAGNOSIS — F330 Major depressive disorder, recurrent, mild: Secondary | ICD-10-CM | POA: Diagnosis not present

## 2018-01-05 DIAGNOSIS — Z9079 Acquired absence of other genital organ(s): Secondary | ICD-10-CM | POA: Diagnosis not present

## 2018-01-05 DIAGNOSIS — N319 Neuromuscular dysfunction of bladder, unspecified: Secondary | ICD-10-CM | POA: Diagnosis not present

## 2018-01-05 DIAGNOSIS — Z8673 Personal history of transient ischemic attack (TIA), and cerebral infarction without residual deficits: Secondary | ICD-10-CM | POA: Diagnosis not present

## 2018-01-05 DIAGNOSIS — Z9861 Coronary angioplasty status: Secondary | ICD-10-CM | POA: Diagnosis not present

## 2018-01-05 DIAGNOSIS — I10 Essential (primary) hypertension: Secondary | ICD-10-CM | POA: Diagnosis not present

## 2018-01-05 DIAGNOSIS — E785 Hyperlipidemia, unspecified: Secondary | ICD-10-CM | POA: Diagnosis not present

## 2018-01-05 DIAGNOSIS — Z7902 Long term (current) use of antithrombotics/antiplatelets: Secondary | ICD-10-CM | POA: Diagnosis not present

## 2018-01-05 DIAGNOSIS — M545 Low back pain: Secondary | ICD-10-CM | POA: Diagnosis not present

## 2018-01-05 DIAGNOSIS — H9193 Unspecified hearing loss, bilateral: Secondary | ICD-10-CM | POA: Diagnosis not present

## 2018-01-05 DIAGNOSIS — E663 Overweight: Secondary | ICD-10-CM | POA: Diagnosis not present

## 2018-01-05 DIAGNOSIS — G40909 Epilepsy, unspecified, not intractable, without status epilepticus: Secondary | ICD-10-CM | POA: Diagnosis not present

## 2018-01-21 ENCOUNTER — Ambulatory Visit (INDEPENDENT_AMBULATORY_CARE_PROVIDER_SITE_OTHER): Payer: Medicare HMO | Admitting: Urology

## 2018-01-21 DIAGNOSIS — N312 Flaccid neuropathic bladder, not elsewhere classified: Secondary | ICD-10-CM

## 2018-02-22 ENCOUNTER — Other Ambulatory Visit: Payer: Self-pay | Admitting: Internal Medicine

## 2018-02-23 ENCOUNTER — Ambulatory Visit (INDEPENDENT_AMBULATORY_CARE_PROVIDER_SITE_OTHER): Payer: Medicare HMO | Admitting: Urology

## 2018-02-23 ENCOUNTER — Other Ambulatory Visit (HOSPITAL_COMMUNITY)
Admission: RE | Admit: 2018-02-23 | Discharge: 2018-02-23 | Disposition: A | Discharge status: Home / Self Care | Payer: Medicare HMO | Source: Other Acute Inpatient Hospital | Attending: Urology | Admitting: Urology

## 2018-02-23 DIAGNOSIS — N3000 Acute cystitis without hematuria: Secondary | ICD-10-CM | POA: Insufficient documentation

## 2018-02-23 DIAGNOSIS — N401 Enlarged prostate with lower urinary tract symptoms: Secondary | ICD-10-CM | POA: Diagnosis not present

## 2018-02-27 LAB — URINE CULTURE

## 2018-03-02 ENCOUNTER — Ambulatory Visit (INDEPENDENT_AMBULATORY_CARE_PROVIDER_SITE_OTHER): Payer: Medicare HMO | Admitting: Urology

## 2018-03-02 DIAGNOSIS — N401 Enlarged prostate with lower urinary tract symptoms: Secondary | ICD-10-CM | POA: Diagnosis not present

## 2018-03-02 DIAGNOSIS — R339 Retention of urine, unspecified: Secondary | ICD-10-CM | POA: Diagnosis not present

## 2018-03-23 ENCOUNTER — Ambulatory Visit (INDEPENDENT_AMBULATORY_CARE_PROVIDER_SITE_OTHER): Payer: Medicare HMO | Admitting: Urology

## 2018-03-23 DIAGNOSIS — N401 Enlarged prostate with lower urinary tract symptoms: Secondary | ICD-10-CM

## 2018-03-31 DIAGNOSIS — D500 Iron deficiency anemia secondary to blood loss (chronic): Secondary | ICD-10-CM | POA: Diagnosis not present

## 2018-03-31 DIAGNOSIS — E782 Mixed hyperlipidemia: Secondary | ICD-10-CM | POA: Diagnosis not present

## 2018-03-31 DIAGNOSIS — I10 Essential (primary) hypertension: Secondary | ICD-10-CM | POA: Diagnosis not present

## 2018-03-31 DIAGNOSIS — F331 Major depressive disorder, recurrent, moderate: Secondary | ICD-10-CM | POA: Diagnosis not present

## 2018-03-31 DIAGNOSIS — D511 Vitamin B12 deficiency anemia due to selective vitamin B12 malabsorption with proteinuria: Secondary | ICD-10-CM | POA: Diagnosis not present

## 2018-03-31 DIAGNOSIS — I609 Nontraumatic subarachnoid hemorrhage, unspecified: Secondary | ICD-10-CM | POA: Diagnosis not present

## 2018-03-31 DIAGNOSIS — R5383 Other fatigue: Secondary | ICD-10-CM | POA: Diagnosis not present

## 2018-03-31 DIAGNOSIS — Z9189 Other specified personal risk factors, not elsewhere classified: Secondary | ICD-10-CM | POA: Diagnosis not present

## 2018-04-04 DIAGNOSIS — K5901 Slow transit constipation: Secondary | ICD-10-CM | POA: Diagnosis not present

## 2018-04-04 DIAGNOSIS — I10 Essential (primary) hypertension: Secondary | ICD-10-CM | POA: Diagnosis not present

## 2018-04-04 DIAGNOSIS — E782 Mixed hyperlipidemia: Secondary | ICD-10-CM | POA: Diagnosis not present

## 2018-04-04 DIAGNOSIS — G8114 Spastic hemiplegia affecting left nondominant side: Secondary | ICD-10-CM | POA: Diagnosis not present

## 2018-04-04 DIAGNOSIS — M545 Low back pain: Secondary | ICD-10-CM | POA: Diagnosis not present

## 2018-04-04 DIAGNOSIS — Z6830 Body mass index (BMI) 30.0-30.9, adult: Secondary | ICD-10-CM | POA: Diagnosis not present

## 2018-04-04 DIAGNOSIS — D500 Iron deficiency anemia secondary to blood loss (chronic): Secondary | ICD-10-CM | POA: Diagnosis not present

## 2018-04-04 DIAGNOSIS — Z0001 Encounter for general adult medical examination with abnormal findings: Secondary | ICD-10-CM | POA: Diagnosis not present

## 2018-04-04 DIAGNOSIS — G40309 Generalized idiopathic epilepsy and epileptic syndromes, not intractable, without status epilepticus: Secondary | ICD-10-CM | POA: Diagnosis not present

## 2018-04-19 DIAGNOSIS — I70203 Unspecified atherosclerosis of native arteries of extremities, bilateral legs: Secondary | ICD-10-CM | POA: Diagnosis not present

## 2018-04-19 DIAGNOSIS — M79676 Pain in unspecified toe(s): Secondary | ICD-10-CM | POA: Diagnosis not present

## 2018-04-19 DIAGNOSIS — B351 Tinea unguium: Secondary | ICD-10-CM | POA: Diagnosis not present

## 2018-04-19 DIAGNOSIS — L84 Corns and callosities: Secondary | ICD-10-CM | POA: Diagnosis not present

## 2018-04-26 DIAGNOSIS — E039 Hypothyroidism, unspecified: Secondary | ICD-10-CM | POA: Diagnosis not present

## 2018-04-26 DIAGNOSIS — I10 Essential (primary) hypertension: Secondary | ICD-10-CM | POA: Diagnosis not present

## 2018-04-26 DIAGNOSIS — G40909 Epilepsy, unspecified, not intractable, without status epilepticus: Secondary | ICD-10-CM | POA: Diagnosis not present

## 2018-04-26 DIAGNOSIS — F331 Major depressive disorder, recurrent, moderate: Secondary | ICD-10-CM | POA: Diagnosis not present

## 2018-04-26 DIAGNOSIS — D692 Other nonthrombocytopenic purpura: Secondary | ICD-10-CM | POA: Diagnosis not present

## 2018-04-26 DIAGNOSIS — Z7722 Contact with and (suspected) exposure to environmental tobacco smoke (acute) (chronic): Secondary | ICD-10-CM | POA: Diagnosis not present

## 2018-04-26 DIAGNOSIS — G8311 Monoplegia of lower limb affecting right dominant side: Secondary | ICD-10-CM | POA: Diagnosis not present

## 2018-04-26 DIAGNOSIS — E785 Hyperlipidemia, unspecified: Secondary | ICD-10-CM | POA: Diagnosis not present

## 2018-04-26 DIAGNOSIS — Z6830 Body mass index (BMI) 30.0-30.9, adult: Secondary | ICD-10-CM | POA: Diagnosis not present

## 2018-04-27 ENCOUNTER — Ambulatory Visit: Payer: Medicare HMO | Admitting: Urology

## 2018-04-29 ENCOUNTER — Ambulatory Visit (INDEPENDENT_AMBULATORY_CARE_PROVIDER_SITE_OTHER): Payer: Medicare HMO | Admitting: Urology

## 2018-04-29 DIAGNOSIS — N312 Flaccid neuropathic bladder, not elsewhere classified: Secondary | ICD-10-CM | POA: Diagnosis not present

## 2018-05-16 DIAGNOSIS — D519 Vitamin B12 deficiency anemia, unspecified: Secondary | ICD-10-CM | POA: Diagnosis not present

## 2018-05-16 DIAGNOSIS — D500 Iron deficiency anemia secondary to blood loss (chronic): Secondary | ICD-10-CM | POA: Diagnosis not present

## 2018-05-25 DIAGNOSIS — Z6829 Body mass index (BMI) 29.0-29.9, adult: Secondary | ICD-10-CM | POA: Diagnosis not present

## 2018-05-25 DIAGNOSIS — E785 Hyperlipidemia, unspecified: Secondary | ICD-10-CM | POA: Diagnosis not present

## 2018-05-25 DIAGNOSIS — M353 Polymyalgia rheumatica: Secondary | ICD-10-CM | POA: Diagnosis not present

## 2018-05-25 DIAGNOSIS — J449 Chronic obstructive pulmonary disease, unspecified: Secondary | ICD-10-CM | POA: Diagnosis not present

## 2018-05-25 DIAGNOSIS — F331 Major depressive disorder, recurrent, moderate: Secondary | ICD-10-CM | POA: Diagnosis not present

## 2018-05-25 DIAGNOSIS — I10 Essential (primary) hypertension: Secondary | ICD-10-CM | POA: Diagnosis not present

## 2018-05-25 DIAGNOSIS — N139 Obstructive and reflux uropathy, unspecified: Secondary | ICD-10-CM | POA: Diagnosis not present

## 2018-06-03 ENCOUNTER — Ambulatory Visit (INDEPENDENT_AMBULATORY_CARE_PROVIDER_SITE_OTHER): Payer: Medicare HMO | Admitting: Urology

## 2018-06-03 DIAGNOSIS — R339 Retention of urine, unspecified: Secondary | ICD-10-CM

## 2018-06-03 DIAGNOSIS — N401 Enlarged prostate with lower urinary tract symptoms: Secondary | ICD-10-CM | POA: Diagnosis not present

## 2018-06-03 DIAGNOSIS — N312 Flaccid neuropathic bladder, not elsewhere classified: Secondary | ICD-10-CM | POA: Diagnosis not present

## 2018-06-06 DIAGNOSIS — I10 Essential (primary) hypertension: Secondary | ICD-10-CM | POA: Diagnosis not present

## 2018-06-06 DIAGNOSIS — E782 Mixed hyperlipidemia: Secondary | ICD-10-CM | POA: Diagnosis not present

## 2018-06-06 DIAGNOSIS — R339 Retention of urine, unspecified: Secondary | ICD-10-CM | POA: Diagnosis not present

## 2018-06-06 DIAGNOSIS — N401 Enlarged prostate with lower urinary tract symptoms: Secondary | ICD-10-CM | POA: Diagnosis not present

## 2018-06-06 DIAGNOSIS — D500 Iron deficiency anemia secondary to blood loss (chronic): Secondary | ICD-10-CM | POA: Diagnosis not present

## 2018-06-28 DIAGNOSIS — D529 Folate deficiency anemia, unspecified: Secondary | ICD-10-CM | POA: Diagnosis not present

## 2018-06-28 DIAGNOSIS — G40909 Epilepsy, unspecified, not intractable, without status epilepticus: Secondary | ICD-10-CM | POA: Diagnosis not present

## 2018-06-28 DIAGNOSIS — D519 Vitamin B12 deficiency anemia, unspecified: Secondary | ICD-10-CM | POA: Diagnosis not present

## 2018-06-28 DIAGNOSIS — D649 Anemia, unspecified: Secondary | ICD-10-CM | POA: Diagnosis not present

## 2018-06-28 DIAGNOSIS — E039 Hypothyroidism, unspecified: Secondary | ICD-10-CM | POA: Diagnosis not present

## 2018-06-30 DIAGNOSIS — R339 Retention of urine, unspecified: Secondary | ICD-10-CM | POA: Diagnosis not present

## 2018-06-30 DIAGNOSIS — N401 Enlarged prostate with lower urinary tract symptoms: Secondary | ICD-10-CM | POA: Diagnosis not present

## 2018-07-05 ENCOUNTER — Ambulatory Visit (INDEPENDENT_AMBULATORY_CARE_PROVIDER_SITE_OTHER): Payer: Medicare HMO | Admitting: Urology

## 2018-07-05 DIAGNOSIS — N401 Enlarged prostate with lower urinary tract symptoms: Secondary | ICD-10-CM | POA: Diagnosis not present

## 2018-07-05 DIAGNOSIS — N312 Flaccid neuropathic bladder, not elsewhere classified: Secondary | ICD-10-CM | POA: Diagnosis not present

## 2018-07-05 DIAGNOSIS — R3914 Feeling of incomplete bladder emptying: Secondary | ICD-10-CM

## 2018-07-06 DIAGNOSIS — E782 Mixed hyperlipidemia: Secondary | ICD-10-CM | POA: Diagnosis not present

## 2018-07-06 DIAGNOSIS — D485 Neoplasm of uncertain behavior of skin: Secondary | ICD-10-CM | POA: Diagnosis not present

## 2018-07-06 DIAGNOSIS — I10 Essential (primary) hypertension: Secondary | ICD-10-CM | POA: Diagnosis not present

## 2018-07-06 DIAGNOSIS — Z6830 Body mass index (BMI) 30.0-30.9, adult: Secondary | ICD-10-CM | POA: Diagnosis not present

## 2018-07-06 DIAGNOSIS — K219 Gastro-esophageal reflux disease without esophagitis: Secondary | ICD-10-CM | POA: Diagnosis not present

## 2018-07-06 DIAGNOSIS — F331 Major depressive disorder, recurrent, moderate: Secondary | ICD-10-CM | POA: Diagnosis not present

## 2018-07-08 DIAGNOSIS — C44300 Unspecified malignant neoplasm of skin of unspecified part of face: Secondary | ICD-10-CM | POA: Diagnosis not present

## 2018-07-08 DIAGNOSIS — L820 Inflamed seborrheic keratosis: Secondary | ICD-10-CM | POA: Diagnosis not present

## 2018-07-29 DIAGNOSIS — N401 Enlarged prostate with lower urinary tract symptoms: Secondary | ICD-10-CM | POA: Diagnosis not present

## 2018-07-29 DIAGNOSIS — R339 Retention of urine, unspecified: Secondary | ICD-10-CM | POA: Diagnosis not present

## 2018-08-01 DIAGNOSIS — K219 Gastro-esophageal reflux disease without esophagitis: Secondary | ICD-10-CM | POA: Diagnosis not present

## 2018-08-01 DIAGNOSIS — I10 Essential (primary) hypertension: Secondary | ICD-10-CM | POA: Diagnosis not present

## 2018-08-01 DIAGNOSIS — E782 Mixed hyperlipidemia: Secondary | ICD-10-CM | POA: Diagnosis not present

## 2018-08-01 DIAGNOSIS — D500 Iron deficiency anemia secondary to blood loss (chronic): Secondary | ICD-10-CM | POA: Diagnosis not present

## 2018-08-01 DIAGNOSIS — E039 Hypothyroidism, unspecified: Secondary | ICD-10-CM | POA: Diagnosis not present

## 2018-08-01 DIAGNOSIS — D529 Folate deficiency anemia, unspecified: Secondary | ICD-10-CM | POA: Diagnosis not present

## 2018-08-01 DIAGNOSIS — G40909 Epilepsy, unspecified, not intractable, without status epilepticus: Secondary | ICD-10-CM | POA: Diagnosis not present

## 2018-08-01 DIAGNOSIS — D519 Vitamin B12 deficiency anemia, unspecified: Secondary | ICD-10-CM | POA: Diagnosis not present

## 2018-08-03 ENCOUNTER — Ambulatory Visit (INDEPENDENT_AMBULATORY_CARE_PROVIDER_SITE_OTHER): Payer: Medicare HMO | Admitting: Urology

## 2018-08-03 DIAGNOSIS — N312 Flaccid neuropathic bladder, not elsewhere classified: Secondary | ICD-10-CM

## 2018-08-05 DIAGNOSIS — Z6830 Body mass index (BMI) 30.0-30.9, adult: Secondary | ICD-10-CM | POA: Diagnosis not present

## 2018-08-05 DIAGNOSIS — I10 Essential (primary) hypertension: Secondary | ICD-10-CM | POA: Diagnosis not present

## 2018-08-05 DIAGNOSIS — G40309 Generalized idiopathic epilepsy and epileptic syndromes, not intractable, without status epilepticus: Secondary | ICD-10-CM | POA: Diagnosis not present

## 2018-08-05 DIAGNOSIS — E782 Mixed hyperlipidemia: Secondary | ICD-10-CM | POA: Diagnosis not present

## 2018-08-05 DIAGNOSIS — G8114 Spastic hemiplegia affecting left nondominant side: Secondary | ICD-10-CM | POA: Diagnosis not present

## 2018-08-05 DIAGNOSIS — M545 Low back pain: Secondary | ICD-10-CM | POA: Diagnosis not present

## 2018-08-05 DIAGNOSIS — K5901 Slow transit constipation: Secondary | ICD-10-CM | POA: Diagnosis not present

## 2018-08-05 DIAGNOSIS — D500 Iron deficiency anemia secondary to blood loss (chronic): Secondary | ICD-10-CM | POA: Diagnosis not present

## 2018-08-16 DIAGNOSIS — D500 Iron deficiency anemia secondary to blood loss (chronic): Secondary | ICD-10-CM | POA: Diagnosis not present

## 2018-08-16 DIAGNOSIS — D529 Folate deficiency anemia, unspecified: Secondary | ICD-10-CM | POA: Diagnosis not present

## 2018-08-16 DIAGNOSIS — D519 Vitamin B12 deficiency anemia, unspecified: Secondary | ICD-10-CM | POA: Diagnosis not present

## 2018-08-25 DIAGNOSIS — R339 Retention of urine, unspecified: Secondary | ICD-10-CM | POA: Diagnosis not present

## 2018-08-25 DIAGNOSIS — N401 Enlarged prostate with lower urinary tract symptoms: Secondary | ICD-10-CM | POA: Diagnosis not present

## 2018-09-05 DIAGNOSIS — E782 Mixed hyperlipidemia: Secondary | ICD-10-CM | POA: Diagnosis not present

## 2018-09-05 DIAGNOSIS — K219 Gastro-esophageal reflux disease without esophagitis: Secondary | ICD-10-CM | POA: Diagnosis not present

## 2018-09-05 DIAGNOSIS — I10 Essential (primary) hypertension: Secondary | ICD-10-CM | POA: Diagnosis not present

## 2018-09-09 ENCOUNTER — Ambulatory Visit (INDEPENDENT_AMBULATORY_CARE_PROVIDER_SITE_OTHER): Payer: Medicare HMO | Admitting: Urology

## 2018-09-09 DIAGNOSIS — N312 Flaccid neuropathic bladder, not elsewhere classified: Secondary | ICD-10-CM

## 2018-09-19 DIAGNOSIS — Z7902 Long term (current) use of antithrombotics/antiplatelets: Secondary | ICD-10-CM | POA: Diagnosis not present

## 2018-09-19 DIAGNOSIS — G40909 Epilepsy, unspecified, not intractable, without status epilepticus: Secondary | ICD-10-CM | POA: Diagnosis not present

## 2018-09-19 DIAGNOSIS — F331 Major depressive disorder, recurrent, moderate: Secondary | ICD-10-CM | POA: Diagnosis not present

## 2018-09-19 DIAGNOSIS — J449 Chronic obstructive pulmonary disease, unspecified: Secondary | ICD-10-CM | POA: Diagnosis not present

## 2018-09-19 DIAGNOSIS — D692 Other nonthrombocytopenic purpura: Secondary | ICD-10-CM | POA: Diagnosis not present

## 2018-09-19 DIAGNOSIS — G8311 Monoplegia of lower limb affecting right dominant side: Secondary | ICD-10-CM | POA: Diagnosis not present

## 2018-09-19 DIAGNOSIS — Z6827 Body mass index (BMI) 27.0-27.9, adult: Secondary | ICD-10-CM | POA: Diagnosis not present

## 2018-09-28 DIAGNOSIS — G40909 Epilepsy, unspecified, not intractable, without status epilepticus: Secondary | ICD-10-CM | POA: Diagnosis not present

## 2018-09-28 DIAGNOSIS — D500 Iron deficiency anemia secondary to blood loss (chronic): Secondary | ICD-10-CM | POA: Diagnosis not present

## 2018-09-28 DIAGNOSIS — D529 Folate deficiency anemia, unspecified: Secondary | ICD-10-CM | POA: Diagnosis not present

## 2018-09-28 DIAGNOSIS — D509 Iron deficiency anemia, unspecified: Secondary | ICD-10-CM | POA: Diagnosis not present

## 2018-10-06 DIAGNOSIS — E782 Mixed hyperlipidemia: Secondary | ICD-10-CM | POA: Diagnosis not present

## 2018-10-06 DIAGNOSIS — F331 Major depressive disorder, recurrent, moderate: Secondary | ICD-10-CM | POA: Diagnosis not present

## 2018-10-06 DIAGNOSIS — I10 Essential (primary) hypertension: Secondary | ICD-10-CM | POA: Diagnosis not present

## 2018-10-06 DIAGNOSIS — E039 Hypothyroidism, unspecified: Secondary | ICD-10-CM | POA: Diagnosis not present

## 2018-10-06 DIAGNOSIS — N401 Enlarged prostate with lower urinary tract symptoms: Secondary | ICD-10-CM | POA: Diagnosis not present

## 2018-10-06 DIAGNOSIS — R339 Retention of urine, unspecified: Secondary | ICD-10-CM | POA: Diagnosis not present

## 2018-10-18 ENCOUNTER — Encounter: Payer: Self-pay | Admitting: Internal Medicine

## 2018-10-19 ENCOUNTER — Ambulatory Visit (INDEPENDENT_AMBULATORY_CARE_PROVIDER_SITE_OTHER): Payer: Medicare HMO | Admitting: Urology

## 2018-10-19 DIAGNOSIS — N312 Flaccid neuropathic bladder, not elsewhere classified: Secondary | ICD-10-CM

## 2018-10-31 DIAGNOSIS — N401 Enlarged prostate with lower urinary tract symptoms: Secondary | ICD-10-CM | POA: Diagnosis not present

## 2018-10-31 DIAGNOSIS — R339 Retention of urine, unspecified: Secondary | ICD-10-CM | POA: Diagnosis not present

## 2018-11-18 ENCOUNTER — Ambulatory Visit (INDEPENDENT_AMBULATORY_CARE_PROVIDER_SITE_OTHER): Payer: Medicare HMO | Admitting: Urology

## 2018-11-18 DIAGNOSIS — N312 Flaccid neuropathic bladder, not elsewhere classified: Secondary | ICD-10-CM

## 2018-11-24 DIAGNOSIS — R339 Retention of urine, unspecified: Secondary | ICD-10-CM | POA: Diagnosis not present

## 2018-11-24 DIAGNOSIS — D509 Iron deficiency anemia, unspecified: Secondary | ICD-10-CM | POA: Diagnosis not present

## 2018-11-24 DIAGNOSIS — D529 Folate deficiency anemia, unspecified: Secondary | ICD-10-CM | POA: Diagnosis not present

## 2018-11-24 DIAGNOSIS — N401 Enlarged prostate with lower urinary tract symptoms: Secondary | ICD-10-CM | POA: Diagnosis not present

## 2018-11-24 DIAGNOSIS — D500 Iron deficiency anemia secondary to blood loss (chronic): Secondary | ICD-10-CM | POA: Diagnosis not present

## 2018-11-25 DIAGNOSIS — R339 Retention of urine, unspecified: Secondary | ICD-10-CM | POA: Diagnosis not present

## 2018-11-25 DIAGNOSIS — N401 Enlarged prostate with lower urinary tract symptoms: Secondary | ICD-10-CM | POA: Diagnosis not present

## 2018-12-02 DIAGNOSIS — K219 Gastro-esophageal reflux disease without esophagitis: Secondary | ICD-10-CM | POA: Diagnosis not present

## 2018-12-02 DIAGNOSIS — E039 Hypothyroidism, unspecified: Secondary | ICD-10-CM | POA: Diagnosis not present

## 2018-12-02 DIAGNOSIS — D519 Vitamin B12 deficiency anemia, unspecified: Secondary | ICD-10-CM | POA: Diagnosis not present

## 2018-12-02 DIAGNOSIS — G40909 Epilepsy, unspecified, not intractable, without status epilepticus: Secondary | ICD-10-CM | POA: Diagnosis not present

## 2018-12-02 DIAGNOSIS — D500 Iron deficiency anemia secondary to blood loss (chronic): Secondary | ICD-10-CM | POA: Diagnosis not present

## 2018-12-02 DIAGNOSIS — E782 Mixed hyperlipidemia: Secondary | ICD-10-CM | POA: Diagnosis not present

## 2018-12-05 DIAGNOSIS — E782 Mixed hyperlipidemia: Secondary | ICD-10-CM | POA: Diagnosis not present

## 2018-12-05 DIAGNOSIS — I10 Essential (primary) hypertension: Secondary | ICD-10-CM | POA: Diagnosis not present

## 2018-12-06 DIAGNOSIS — Z0001 Encounter for general adult medical examination with abnormal findings: Secondary | ICD-10-CM | POA: Diagnosis not present

## 2018-12-06 DIAGNOSIS — E782 Mixed hyperlipidemia: Secondary | ICD-10-CM | POA: Diagnosis not present

## 2018-12-06 DIAGNOSIS — G8114 Spastic hemiplegia affecting left nondominant side: Secondary | ICD-10-CM | POA: Diagnosis not present

## 2018-12-06 DIAGNOSIS — D500 Iron deficiency anemia secondary to blood loss (chronic): Secondary | ICD-10-CM | POA: Diagnosis not present

## 2018-12-06 DIAGNOSIS — K5901 Slow transit constipation: Secondary | ICD-10-CM | POA: Diagnosis not present

## 2018-12-06 DIAGNOSIS — F331 Major depressive disorder, recurrent, moderate: Secondary | ICD-10-CM | POA: Diagnosis not present

## 2018-12-06 DIAGNOSIS — G40309 Generalized idiopathic epilepsy and epileptic syndromes, not intractable, without status epilepticus: Secondary | ICD-10-CM | POA: Diagnosis not present

## 2018-12-06 DIAGNOSIS — I10 Essential (primary) hypertension: Secondary | ICD-10-CM | POA: Diagnosis not present

## 2018-12-20 ENCOUNTER — Other Ambulatory Visit: Payer: Self-pay | Admitting: Gastroenterology

## 2018-12-20 DIAGNOSIS — N401 Enlarged prostate with lower urinary tract symptoms: Secondary | ICD-10-CM | POA: Diagnosis not present

## 2018-12-20 DIAGNOSIS — R339 Retention of urine, unspecified: Secondary | ICD-10-CM | POA: Diagnosis not present

## 2018-12-21 ENCOUNTER — Ambulatory Visit (INDEPENDENT_AMBULATORY_CARE_PROVIDER_SITE_OTHER): Payer: Medicare HMO | Admitting: Urology

## 2018-12-21 DIAGNOSIS — R339 Retention of urine, unspecified: Secondary | ICD-10-CM

## 2018-12-30 DIAGNOSIS — R339 Retention of urine, unspecified: Secondary | ICD-10-CM | POA: Diagnosis not present

## 2018-12-30 DIAGNOSIS — N401 Enlarged prostate with lower urinary tract symptoms: Secondary | ICD-10-CM | POA: Diagnosis not present

## 2019-01-05 DIAGNOSIS — F331 Major depressive disorder, recurrent, moderate: Secondary | ICD-10-CM | POA: Diagnosis not present

## 2019-01-05 DIAGNOSIS — E782 Mixed hyperlipidemia: Secondary | ICD-10-CM | POA: Diagnosis not present

## 2019-01-05 DIAGNOSIS — I10 Essential (primary) hypertension: Secondary | ICD-10-CM | POA: Diagnosis not present

## 2019-01-12 DIAGNOSIS — B351 Tinea unguium: Secondary | ICD-10-CM | POA: Diagnosis not present

## 2019-01-12 DIAGNOSIS — L84 Corns and callosities: Secondary | ICD-10-CM | POA: Diagnosis not present

## 2019-01-12 DIAGNOSIS — I70203 Unspecified atherosclerosis of native arteries of extremities, bilateral legs: Secondary | ICD-10-CM | POA: Diagnosis not present

## 2019-01-12 DIAGNOSIS — M79676 Pain in unspecified toe(s): Secondary | ICD-10-CM | POA: Diagnosis not present

## 2019-01-20 DIAGNOSIS — I10 Essential (primary) hypertension: Secondary | ICD-10-CM | POA: Diagnosis not present

## 2019-01-20 DIAGNOSIS — D509 Iron deficiency anemia, unspecified: Secondary | ICD-10-CM | POA: Diagnosis not present

## 2019-01-20 DIAGNOSIS — D649 Anemia, unspecified: Secondary | ICD-10-CM | POA: Diagnosis not present

## 2019-01-20 DIAGNOSIS — D529 Folate deficiency anemia, unspecified: Secondary | ICD-10-CM | POA: Diagnosis not present

## 2019-01-20 DIAGNOSIS — R42 Dizziness and giddiness: Secondary | ICD-10-CM | POA: Diagnosis not present

## 2019-01-20 DIAGNOSIS — E782 Mixed hyperlipidemia: Secondary | ICD-10-CM | POA: Diagnosis not present

## 2019-01-20 DIAGNOSIS — D500 Iron deficiency anemia secondary to blood loss (chronic): Secondary | ICD-10-CM | POA: Diagnosis not present

## 2019-01-20 DIAGNOSIS — D519 Vitamin B12 deficiency anemia, unspecified: Secondary | ICD-10-CM | POA: Diagnosis not present

## 2019-01-20 DIAGNOSIS — K219 Gastro-esophageal reflux disease without esophagitis: Secondary | ICD-10-CM | POA: Diagnosis not present

## 2019-01-20 DIAGNOSIS — R5383 Other fatigue: Secondary | ICD-10-CM | POA: Diagnosis not present

## 2019-01-20 DIAGNOSIS — G40909 Epilepsy, unspecified, not intractable, without status epilepticus: Secondary | ICD-10-CM | POA: Diagnosis not present

## 2019-01-24 ENCOUNTER — Ambulatory Visit (INDEPENDENT_AMBULATORY_CARE_PROVIDER_SITE_OTHER): Payer: Medicare HMO | Admitting: Urology

## 2019-01-24 DIAGNOSIS — R339 Retention of urine, unspecified: Secondary | ICD-10-CM

## 2019-02-02 DIAGNOSIS — N401 Enlarged prostate with lower urinary tract symptoms: Secondary | ICD-10-CM | POA: Diagnosis not present

## 2019-02-02 DIAGNOSIS — R339 Retention of urine, unspecified: Secondary | ICD-10-CM | POA: Diagnosis not present

## 2019-02-24 ENCOUNTER — Ambulatory Visit (INDEPENDENT_AMBULATORY_CARE_PROVIDER_SITE_OTHER): Payer: Medicare HMO | Admitting: Urology

## 2019-02-24 DIAGNOSIS — R339 Retention of urine, unspecified: Secondary | ICD-10-CM | POA: Diagnosis not present

## 2019-03-01 DIAGNOSIS — N401 Enlarged prostate with lower urinary tract symptoms: Secondary | ICD-10-CM | POA: Diagnosis not present

## 2019-03-01 DIAGNOSIS — D649 Anemia, unspecified: Secondary | ICD-10-CM | POA: Diagnosis not present

## 2019-03-01 DIAGNOSIS — R5383 Other fatigue: Secondary | ICD-10-CM | POA: Diagnosis not present

## 2019-03-01 DIAGNOSIS — R339 Retention of urine, unspecified: Secondary | ICD-10-CM | POA: Diagnosis not present

## 2019-03-01 DIAGNOSIS — D519 Vitamin B12 deficiency anemia, unspecified: Secondary | ICD-10-CM | POA: Diagnosis not present

## 2019-03-01 DIAGNOSIS — D529 Folate deficiency anemia, unspecified: Secondary | ICD-10-CM | POA: Diagnosis not present

## 2019-03-07 DIAGNOSIS — E782 Mixed hyperlipidemia: Secondary | ICD-10-CM | POA: Diagnosis not present

## 2019-03-07 DIAGNOSIS — I10 Essential (primary) hypertension: Secondary | ICD-10-CM | POA: Diagnosis not present

## 2019-03-20 DIAGNOSIS — J449 Chronic obstructive pulmonary disease, unspecified: Secondary | ICD-10-CM | POA: Diagnosis not present

## 2019-03-20 DIAGNOSIS — Z6827 Body mass index (BMI) 27.0-27.9, adult: Secondary | ICD-10-CM | POA: Diagnosis not present

## 2019-03-20 DIAGNOSIS — Z960 Presence of urogenital implants: Secondary | ICD-10-CM | POA: Diagnosis not present

## 2019-03-20 DIAGNOSIS — N139 Obstructive and reflux uropathy, unspecified: Secondary | ICD-10-CM | POA: Diagnosis not present

## 2019-03-20 DIAGNOSIS — I10 Essential (primary) hypertension: Secondary | ICD-10-CM | POA: Diagnosis not present

## 2019-03-28 ENCOUNTER — Ambulatory Visit: Payer: Medicare HMO | Admitting: Urology

## 2019-03-30 DIAGNOSIS — Z0001 Encounter for general adult medical examination with abnormal findings: Secondary | ICD-10-CM | POA: Diagnosis not present

## 2019-03-30 DIAGNOSIS — D500 Iron deficiency anemia secondary to blood loss (chronic): Secondary | ICD-10-CM | POA: Diagnosis not present

## 2019-03-30 DIAGNOSIS — D519 Vitamin B12 deficiency anemia, unspecified: Secondary | ICD-10-CM | POA: Diagnosis not present

## 2019-03-30 DIAGNOSIS — G40309 Generalized idiopathic epilepsy and epileptic syndromes, not intractable, without status epilepticus: Secondary | ICD-10-CM | POA: Diagnosis not present

## 2019-03-30 DIAGNOSIS — E782 Mixed hyperlipidemia: Secondary | ICD-10-CM | POA: Diagnosis not present

## 2019-03-30 DIAGNOSIS — G8114 Spastic hemiplegia affecting left nondominant side: Secondary | ICD-10-CM | POA: Diagnosis not present

## 2019-03-30 DIAGNOSIS — K5901 Slow transit constipation: Secondary | ICD-10-CM | POA: Diagnosis not present

## 2019-03-30 DIAGNOSIS — E039 Hypothyroidism, unspecified: Secondary | ICD-10-CM | POA: Diagnosis not present

## 2019-03-30 DIAGNOSIS — D529 Folate deficiency anemia, unspecified: Secondary | ICD-10-CM | POA: Diagnosis not present

## 2019-03-30 DIAGNOSIS — Z6830 Body mass index (BMI) 30.0-30.9, adult: Secondary | ICD-10-CM | POA: Diagnosis not present

## 2019-03-30 DIAGNOSIS — K219 Gastro-esophageal reflux disease without esophagitis: Secondary | ICD-10-CM | POA: Diagnosis not present

## 2019-03-30 DIAGNOSIS — G40909 Epilepsy, unspecified, not intractable, without status epilepticus: Secondary | ICD-10-CM | POA: Diagnosis not present

## 2019-03-30 DIAGNOSIS — I10 Essential (primary) hypertension: Secondary | ICD-10-CM | POA: Diagnosis not present

## 2019-03-30 DIAGNOSIS — D649 Anemia, unspecified: Secondary | ICD-10-CM | POA: Diagnosis not present

## 2019-04-06 DIAGNOSIS — M79676 Pain in unspecified toe(s): Secondary | ICD-10-CM | POA: Diagnosis not present

## 2019-04-06 DIAGNOSIS — L84 Corns and callosities: Secondary | ICD-10-CM | POA: Diagnosis not present

## 2019-04-06 DIAGNOSIS — I70203 Unspecified atherosclerosis of native arteries of extremities, bilateral legs: Secondary | ICD-10-CM | POA: Diagnosis not present

## 2019-04-06 DIAGNOSIS — B351 Tinea unguium: Secondary | ICD-10-CM | POA: Diagnosis not present

## 2019-04-10 DIAGNOSIS — R339 Retention of urine, unspecified: Secondary | ICD-10-CM | POA: Diagnosis not present

## 2019-04-10 DIAGNOSIS — N401 Enlarged prostate with lower urinary tract symptoms: Secondary | ICD-10-CM | POA: Diagnosis not present

## 2019-04-19 ENCOUNTER — Other Ambulatory Visit: Payer: Self-pay

## 2019-04-19 ENCOUNTER — Ambulatory Visit (INDEPENDENT_AMBULATORY_CARE_PROVIDER_SITE_OTHER): Payer: Medicare HMO | Admitting: Urology

## 2019-04-19 DIAGNOSIS — N5201 Erectile dysfunction due to arterial insufficiency: Secondary | ICD-10-CM

## 2019-04-19 DIAGNOSIS — E291 Testicular hypofunction: Secondary | ICD-10-CM | POA: Diagnosis not present

## 2019-05-04 DIAGNOSIS — F331 Major depressive disorder, recurrent, moderate: Secondary | ICD-10-CM | POA: Diagnosis not present

## 2019-05-04 DIAGNOSIS — D500 Iron deficiency anemia secondary to blood loss (chronic): Secondary | ICD-10-CM | POA: Diagnosis not present

## 2019-05-04 DIAGNOSIS — G8114 Spastic hemiplegia affecting left nondominant side: Secondary | ICD-10-CM | POA: Diagnosis not present

## 2019-05-04 DIAGNOSIS — Z1212 Encounter for screening for malignant neoplasm of rectum: Secondary | ICD-10-CM | POA: Diagnosis not present

## 2019-05-04 DIAGNOSIS — Z6830 Body mass index (BMI) 30.0-30.9, adult: Secondary | ICD-10-CM | POA: Diagnosis not present

## 2019-05-04 DIAGNOSIS — I10 Essential (primary) hypertension: Secondary | ICD-10-CM | POA: Diagnosis not present

## 2019-05-04 DIAGNOSIS — E782 Mixed hyperlipidemia: Secondary | ICD-10-CM | POA: Diagnosis not present

## 2019-05-04 DIAGNOSIS — K5901 Slow transit constipation: Secondary | ICD-10-CM | POA: Diagnosis not present

## 2019-05-08 DIAGNOSIS — I10 Essential (primary) hypertension: Secondary | ICD-10-CM | POA: Diagnosis not present

## 2019-05-08 DIAGNOSIS — E782 Mixed hyperlipidemia: Secondary | ICD-10-CM | POA: Diagnosis not present

## 2019-05-17 ENCOUNTER — Ambulatory Visit (INDEPENDENT_AMBULATORY_CARE_PROVIDER_SITE_OTHER): Payer: Medicare HMO | Admitting: Urology

## 2019-05-17 ENCOUNTER — Other Ambulatory Visit (HOSPITAL_COMMUNITY)
Admission: RE | Admit: 2019-05-17 | Discharge: 2019-05-17 | Disposition: A | Discharge status: Home / Self Care | Payer: Medicare HMO | Source: Ambulatory Visit | Attending: Urology | Admitting: Urology

## 2019-05-17 DIAGNOSIS — N312 Flaccid neuropathic bladder, not elsewhere classified: Secondary | ICD-10-CM | POA: Diagnosis not present

## 2019-05-17 DIAGNOSIS — N3000 Acute cystitis without hematuria: Secondary | ICD-10-CM | POA: Diagnosis not present

## 2019-05-17 LAB — URINALYSIS, COMPLETE (UACMP) WITH MICROSCOPIC
Bilirubin Urine: NEGATIVE
Glucose, UA: NEGATIVE mg/dL
Ketones, ur: 5 mg/dL — AB
Nitrite: NEGATIVE
Protein, ur: 100 mg/dL — AB
RBC / HPF: >50 RBC/hpf — ABNORMAL HIGH (ref 0–5)
Specific Gravity, Urine: 1.015 (ref 1.005–1.030)
WBC, UA: >50 WBC/hpf — ABNORMAL HIGH (ref 0–5)
pH: 7 (ref 5.0–8.0)

## 2019-05-18 LAB — URINE CULTURE

## 2019-06-07 DIAGNOSIS — Z23 Encounter for immunization: Secondary | ICD-10-CM | POA: Diagnosis not present

## 2019-06-14 ENCOUNTER — Ambulatory Visit (INDEPENDENT_AMBULATORY_CARE_PROVIDER_SITE_OTHER): Payer: Medicare HMO | Admitting: Urology

## 2019-06-14 DIAGNOSIS — N312 Flaccid neuropathic bladder, not elsewhere classified: Secondary | ICD-10-CM

## 2019-06-15 DIAGNOSIS — I70203 Unspecified atherosclerosis of native arteries of extremities, bilateral legs: Secondary | ICD-10-CM | POA: Diagnosis not present

## 2019-06-15 DIAGNOSIS — L84 Corns and callosities: Secondary | ICD-10-CM | POA: Diagnosis not present

## 2019-06-15 DIAGNOSIS — M79676 Pain in unspecified toe(s): Secondary | ICD-10-CM | POA: Diagnosis not present

## 2019-06-15 DIAGNOSIS — B351 Tinea unguium: Secondary | ICD-10-CM | POA: Diagnosis not present

## 2019-07-11 ENCOUNTER — Ambulatory Visit (INDEPENDENT_AMBULATORY_CARE_PROVIDER_SITE_OTHER): Payer: Medicare HMO | Admitting: Urology

## 2019-07-11 DIAGNOSIS — N401 Enlarged prostate with lower urinary tract symptoms: Secondary | ICD-10-CM | POA: Diagnosis not present

## 2019-07-11 DIAGNOSIS — N312 Flaccid neuropathic bladder, not elsewhere classified: Secondary | ICD-10-CM | POA: Diagnosis not present

## 2019-07-11 DIAGNOSIS — R3914 Feeling of incomplete bladder emptying: Secondary | ICD-10-CM

## 2019-08-02 DIAGNOSIS — G8114 Spastic hemiplegia affecting left nondominant side: Secondary | ICD-10-CM | POA: Diagnosis not present

## 2019-08-02 DIAGNOSIS — D500 Iron deficiency anemia secondary to blood loss (chronic): Secondary | ICD-10-CM | POA: Diagnosis not present

## 2019-08-02 DIAGNOSIS — I10 Essential (primary) hypertension: Secondary | ICD-10-CM | POA: Diagnosis not present

## 2019-08-02 DIAGNOSIS — Z23 Encounter for immunization: Secondary | ICD-10-CM | POA: Diagnosis not present

## 2019-08-02 DIAGNOSIS — K5901 Slow transit constipation: Secondary | ICD-10-CM | POA: Diagnosis not present

## 2019-08-02 DIAGNOSIS — E782 Mixed hyperlipidemia: Secondary | ICD-10-CM | POA: Diagnosis not present

## 2019-08-02 DIAGNOSIS — Z6830 Body mass index (BMI) 30.0-30.9, adult: Secondary | ICD-10-CM | POA: Diagnosis not present

## 2019-08-02 DIAGNOSIS — F331 Major depressive disorder, recurrent, moderate: Secondary | ICD-10-CM | POA: Diagnosis not present

## 2019-08-02 DIAGNOSIS — G40309 Generalized idiopathic epilepsy and epileptic syndromes, not intractable, without status epilepticus: Secondary | ICD-10-CM | POA: Diagnosis not present

## 2019-08-02 DIAGNOSIS — M545 Low back pain: Secondary | ICD-10-CM | POA: Diagnosis not present

## 2019-08-07 DIAGNOSIS — E782 Mixed hyperlipidemia: Secondary | ICD-10-CM | POA: Diagnosis not present

## 2019-08-07 DIAGNOSIS — I10 Essential (primary) hypertension: Secondary | ICD-10-CM | POA: Diagnosis not present

## 2019-08-09 ENCOUNTER — Ambulatory Visit (INDEPENDENT_AMBULATORY_CARE_PROVIDER_SITE_OTHER): Payer: Medicare HMO | Admitting: Urology

## 2019-08-09 DIAGNOSIS — N132 Hydronephrosis with renal and ureteral calculous obstruction: Secondary | ICD-10-CM

## 2019-09-06 ENCOUNTER — Other Ambulatory Visit: Payer: Self-pay

## 2019-09-06 ENCOUNTER — Ambulatory Visit (INDEPENDENT_AMBULATORY_CARE_PROVIDER_SITE_OTHER): Payer: Medicare HMO

## 2019-09-06 DIAGNOSIS — N320 Bladder-neck obstruction: Secondary | ICD-10-CM

## 2019-09-06 NOTE — Progress Notes (Signed)
Cath Change/ Replacement  Patient is present today for a catheter change due to urinary retention.67ml of water was removed from the balloon, a 16 coude FR foley cath was removed with out difficulty.  Patient was cleaned and prepped in a sterile fashion with betadine and 2% lidocaine jelly was instilled into the urethra. A 16 coude FR foley cath was replaced into the bladder no complications were noted Urine return was noted 20 ml and urine was dark in color. The balloon was filled with 106ml of sterile water. A leg bag was attached for drainage. Patient was given proper instruction on catheter care.    Performed by: [email or web address],lpn  Follow up:  1 month nv cath change

## 2019-10-04 ENCOUNTER — Other Ambulatory Visit: Payer: Self-pay

## 2019-10-04 ENCOUNTER — Ambulatory Visit (INDEPENDENT_AMBULATORY_CARE_PROVIDER_SITE_OTHER): Payer: Medicare HMO

## 2019-10-04 DIAGNOSIS — N320 Bladder-neck obstruction: Secondary | ICD-10-CM

## 2019-10-04 NOTE — Progress Notes (Signed)
Cath Change/ Replacement  Patient is present today for a catheter change due to urinary retention.  57ml of water was removed from the balloon, a 16 FR coude foley cath was removed with out difficulty.  Patient was cleaned and prepped in a sterile fashion with betadine. A 16 FR coude foley cath was replaced into the bladder no complications were noted Urine return was noted 20 ml and urine was yellow/cloudy in color. The balloon was filled with 48ml of sterile water. A leg bag was attached for drainage. Patient was given proper instruction on catheter care.    Performed by: H. Aylani Spurlock LPN  Follow up: in 4 weeks for cath change

## 2019-10-05 DIAGNOSIS — R140 Abdominal distension (gaseous): Secondary | ICD-10-CM | POA: Diagnosis not present

## 2019-10-05 DIAGNOSIS — T83511A Infection and inflammatory reaction due to indwelling urethral catheter, initial encounter: Secondary | ICD-10-CM | POA: Diagnosis not present

## 2019-10-05 DIAGNOSIS — B961 Klebsiella pneumoniae [K. pneumoniae] as the cause of diseases classified elsewhere: Secondary | ICD-10-CM | POA: Diagnosis not present

## 2019-10-05 DIAGNOSIS — N3001 Acute cystitis with hematuria: Secondary | ICD-10-CM | POA: Diagnosis not present

## 2019-10-05 DIAGNOSIS — Z20822 Contact with and (suspected) exposure to covid-19: Secondary | ICD-10-CM | POA: Diagnosis not present

## 2019-10-05 DIAGNOSIS — J189 Pneumonia, unspecified organism: Secondary | ICD-10-CM | POA: Diagnosis not present

## 2019-10-05 DIAGNOSIS — Z20828 Contact with and (suspected) exposure to other viral communicable diseases: Secondary | ICD-10-CM | POA: Diagnosis not present

## 2019-10-05 DIAGNOSIS — A045 Campylobacter enteritis: Secondary | ICD-10-CM | POA: Diagnosis not present

## 2019-10-05 DIAGNOSIS — F339 Major depressive disorder, recurrent, unspecified: Secondary | ICD-10-CM | POA: Diagnosis not present

## 2019-10-05 DIAGNOSIS — K922 Gastrointestinal hemorrhage, unspecified: Secondary | ICD-10-CM | POA: Diagnosis not present

## 2019-10-05 DIAGNOSIS — I5032 Chronic diastolic (congestive) heart failure: Secondary | ICD-10-CM | POA: Diagnosis not present

## 2019-10-05 DIAGNOSIS — R0902 Hypoxemia: Secondary | ICD-10-CM | POA: Diagnosis not present

## 2019-10-05 DIAGNOSIS — R197 Diarrhea, unspecified: Secondary | ICD-10-CM | POA: Diagnosis not present

## 2019-10-05 DIAGNOSIS — F331 Major depressive disorder, recurrent, moderate: Secondary | ICD-10-CM | POA: Diagnosis not present

## 2019-10-05 DIAGNOSIS — R1084 Generalized abdominal pain: Secondary | ICD-10-CM | POA: Diagnosis not present

## 2019-10-05 DIAGNOSIS — N281 Cyst of kidney, acquired: Secondary | ICD-10-CM | POA: Diagnosis not present

## 2019-10-05 DIAGNOSIS — E7849 Other hyperlipidemia: Secondary | ICD-10-CM | POA: Diagnosis not present

## 2019-10-05 DIAGNOSIS — N390 Urinary tract infection, site not specified: Secondary | ICD-10-CM | POA: Diagnosis not present

## 2019-10-06 DIAGNOSIS — F331 Major depressive disorder, recurrent, moderate: Secondary | ICD-10-CM | POA: Diagnosis not present

## 2019-10-06 DIAGNOSIS — E7849 Other hyperlipidemia: Secondary | ICD-10-CM | POA: Diagnosis not present

## 2019-10-10 DIAGNOSIS — Z466 Encounter for fitting and adjustment of urinary device: Secondary | ICD-10-CM | POA: Diagnosis not present

## 2019-10-10 DIAGNOSIS — R197 Diarrhea, unspecified: Secondary | ICD-10-CM | POA: Diagnosis not present

## 2019-10-10 DIAGNOSIS — Z960 Presence of urogenital implants: Secondary | ICD-10-CM | POA: Diagnosis not present

## 2019-10-23 DIAGNOSIS — I10 Essential (primary) hypertension: Secondary | ICD-10-CM | POA: Diagnosis not present

## 2019-10-23 DIAGNOSIS — N139 Obstructive and reflux uropathy, unspecified: Secondary | ICD-10-CM | POA: Diagnosis not present

## 2019-10-23 DIAGNOSIS — M353 Polymyalgia rheumatica: Secondary | ICD-10-CM | POA: Diagnosis not present

## 2019-10-23 DIAGNOSIS — I69334 Monoplegia of upper limb following cerebral infarction affecting left non-dominant side: Secondary | ICD-10-CM | POA: Diagnosis not present

## 2019-10-23 DIAGNOSIS — J449 Chronic obstructive pulmonary disease, unspecified: Secondary | ICD-10-CM | POA: Diagnosis not present

## 2019-10-23 DIAGNOSIS — G40909 Epilepsy, unspecified, not intractable, without status epilepticus: Secondary | ICD-10-CM | POA: Diagnosis not present

## 2019-10-23 DIAGNOSIS — D692 Other nonthrombocytopenic purpura: Secondary | ICD-10-CM | POA: Diagnosis not present

## 2019-10-23 DIAGNOSIS — E785 Hyperlipidemia, unspecified: Secondary | ICD-10-CM | POA: Diagnosis not present

## 2019-10-23 DIAGNOSIS — Z960 Presence of urogenital implants: Secondary | ICD-10-CM | POA: Diagnosis not present

## 2019-10-23 DIAGNOSIS — E039 Hypothyroidism, unspecified: Secondary | ICD-10-CM | POA: Diagnosis not present

## 2019-11-01 ENCOUNTER — Ambulatory Visit (INDEPENDENT_AMBULATORY_CARE_PROVIDER_SITE_OTHER): Payer: Medicare HMO

## 2019-11-01 ENCOUNTER — Other Ambulatory Visit: Payer: Self-pay

## 2019-11-01 VITALS — Temp 97.3°F

## 2019-11-01 DIAGNOSIS — N320 Bladder-neck obstruction: Secondary | ICD-10-CM

## 2019-11-01 NOTE — Progress Notes (Signed)
Cath Change/ Replacement  Patient is present today for a catheter change due to urinary retention.  82ml of water was removed from the balloon, a 16 coudeFR foley cath was removed with out difficulty.  Patient was cleaned and prepped in a sterile fashion with betadine. A 16 coude FR foley cath was replaced into the bladder no complications were noted Urine return was noted 21ml and urine was yellow in color. The balloon was filled with 82ml of sterile water. A leg bag was attached for drainage.Patient was given proper instruction on catheter care.    Performed by: [email or web address]  Follow up: 1 month cath change

## 2019-11-03 DIAGNOSIS — E7849 Other hyperlipidemia: Secondary | ICD-10-CM | POA: Diagnosis not present

## 2019-11-03 DIAGNOSIS — I10 Essential (primary) hypertension: Secondary | ICD-10-CM | POA: Diagnosis not present

## 2019-11-08 DIAGNOSIS — T83511A Infection and inflammatory reaction due to indwelling urethral catheter, initial encounter: Secondary | ICD-10-CM | POA: Diagnosis not present

## 2019-11-08 DIAGNOSIS — A058 Other specified bacterial foodborne intoxications: Secondary | ICD-10-CM | POA: Diagnosis not present

## 2019-11-08 DIAGNOSIS — N390 Urinary tract infection, site not specified: Secondary | ICD-10-CM | POA: Diagnosis not present

## 2019-11-16 ENCOUNTER — Other Ambulatory Visit: Payer: Self-pay | Admitting: Urology

## 2019-11-20 DIAGNOSIS — Z6827 Body mass index (BMI) 27.0-27.9, adult: Secondary | ICD-10-CM | POA: Diagnosis not present

## 2019-11-20 DIAGNOSIS — R05 Cough: Secondary | ICD-10-CM | POA: Diagnosis not present

## 2019-11-20 DIAGNOSIS — Z8673 Personal history of transient ischemic attack (TIA), and cerebral infarction without residual deficits: Secondary | ICD-10-CM | POA: Diagnosis not present

## 2019-11-21 DIAGNOSIS — E782 Mixed hyperlipidemia: Secondary | ICD-10-CM | POA: Diagnosis not present

## 2019-11-21 DIAGNOSIS — K219 Gastro-esophageal reflux disease without esophagitis: Secondary | ICD-10-CM | POA: Diagnosis not present

## 2019-11-21 DIAGNOSIS — G40309 Generalized idiopathic epilepsy and epileptic syndromes, not intractable, without status epilepticus: Secondary | ICD-10-CM | POA: Diagnosis not present

## 2019-11-21 DIAGNOSIS — D529 Folate deficiency anemia, unspecified: Secondary | ICD-10-CM | POA: Diagnosis not present

## 2019-11-21 DIAGNOSIS — I10 Essential (primary) hypertension: Secondary | ICD-10-CM | POA: Diagnosis not present

## 2019-11-21 DIAGNOSIS — D509 Iron deficiency anemia, unspecified: Secondary | ICD-10-CM | POA: Diagnosis not present

## 2019-11-21 DIAGNOSIS — D519 Vitamin B12 deficiency anemia, unspecified: Secondary | ICD-10-CM | POA: Diagnosis not present

## 2019-11-21 DIAGNOSIS — R7301 Impaired fasting glucose: Secondary | ICD-10-CM | POA: Diagnosis not present

## 2019-11-21 DIAGNOSIS — G40909 Epilepsy, unspecified, not intractable, without status epilepticus: Secondary | ICD-10-CM | POA: Diagnosis not present

## 2019-11-21 DIAGNOSIS — E039 Hypothyroidism, unspecified: Secondary | ICD-10-CM | POA: Diagnosis not present

## 2019-11-21 DIAGNOSIS — D649 Anemia, unspecified: Secondary | ICD-10-CM | POA: Diagnosis not present

## 2019-11-28 DIAGNOSIS — Z6829 Body mass index (BMI) 29.0-29.9, adult: Secondary | ICD-10-CM | POA: Diagnosis not present

## 2019-11-28 DIAGNOSIS — K5901 Slow transit constipation: Secondary | ICD-10-CM | POA: Diagnosis not present

## 2019-11-28 DIAGNOSIS — F331 Major depressive disorder, recurrent, moderate: Secondary | ICD-10-CM | POA: Diagnosis not present

## 2019-11-28 DIAGNOSIS — E782 Mixed hyperlipidemia: Secondary | ICD-10-CM | POA: Diagnosis not present

## 2019-11-28 DIAGNOSIS — D500 Iron deficiency anemia secondary to blood loss (chronic): Secondary | ICD-10-CM | POA: Diagnosis not present

## 2019-11-28 DIAGNOSIS — G8114 Spastic hemiplegia affecting left nondominant side: Secondary | ICD-10-CM | POA: Diagnosis not present

## 2019-11-28 DIAGNOSIS — G40309 Generalized idiopathic epilepsy and epileptic syndromes, not intractable, without status epilepticus: Secondary | ICD-10-CM | POA: Diagnosis not present

## 2019-11-28 DIAGNOSIS — I10 Essential (primary) hypertension: Secondary | ICD-10-CM | POA: Diagnosis not present

## 2019-11-29 ENCOUNTER — Other Ambulatory Visit (HOSPITAL_COMMUNITY)
Admission: RE | Admit: 2019-11-29 | Discharge: 2019-11-29 | Disposition: A | Discharge status: Home / Self Care | Payer: Medicare HMO | Source: Other Acute Inpatient Hospital | Attending: Urology | Admitting: Urology

## 2019-11-29 ENCOUNTER — Other Ambulatory Visit: Payer: Self-pay

## 2019-11-29 ENCOUNTER — Ambulatory Visit (INDEPENDENT_AMBULATORY_CARE_PROVIDER_SITE_OTHER): Payer: Medicare HMO

## 2019-11-29 VITALS — Temp 96.3°F

## 2019-11-29 DIAGNOSIS — N320 Bladder-neck obstruction: Secondary | ICD-10-CM

## 2019-11-29 NOTE — Progress Notes (Signed)
Cath Change/ Replacement  Patient is present today for a catheter change due to urinary retention.  78ml of water was removed from the balloon, a 16 coude FR foley cath was removed with out difficulty.  Patient was cleaned and prepped in a sterile fashion with betadine. A 16 coude FR foley cath was replaced into the bladder no complications were noted Urine return was noted 23ml and urine was red tinged in color. The balloon was filled with 76ml of sterile water. A leg bag was attached for drainage. Patient was given proper instruction on catheter care. Pts urine had a very bad odor so a specimen was taken form new cath and Dr. Alyson Ingles sent it for culture.   Performed by: dmerchant,LPN  Follow up: [place]

## 2019-12-03 LAB — URINE CULTURE: Culture: >=100000 — AB

## 2019-12-04 ENCOUNTER — Telehealth: Payer: Self-pay

## 2019-12-04 ENCOUNTER — Other Ambulatory Visit: Payer: Self-pay

## 2019-12-04 DIAGNOSIS — N390 Urinary tract infection, site not specified: Secondary | ICD-10-CM

## 2019-12-04 MED ORDER — SULFAMETHOXAZOLE-TRIMETHOPRIM 800-160 MG PO TABS: 1.0000 | ORAL_TABLET | Freq: Two times a day (BID) | ORAL | 0 refills | Status: DC

## 2019-12-04 NOTE — Telephone Encounter (Signed)
-----   Message from Cleon Gustin, MD sent at 12/04/2019  7:55 AM EDT ----- Bactrim DS BID for 7 days ----- Message ----- From: Dorisann Frames, RN Sent: 12/01/2019  11:49 AM EDT To: Cleon Gustin, MD  Urine culture

## 2019-12-04 NOTE — Telephone Encounter (Signed)
Pt notified rx sent to pharmacy

## 2019-12-20 DIAGNOSIS — D692 Other nonthrombocytopenic purpura: Secondary | ICD-10-CM | POA: Diagnosis not present

## 2019-12-20 DIAGNOSIS — I10 Essential (primary) hypertension: Secondary | ICD-10-CM | POA: Diagnosis not present

## 2019-12-20 DIAGNOSIS — Z6827 Body mass index (BMI) 27.0-27.9, adult: Secondary | ICD-10-CM | POA: Diagnosis not present

## 2019-12-20 DIAGNOSIS — F330 Major depressive disorder, recurrent, mild: Secondary | ICD-10-CM | POA: Diagnosis not present

## 2019-12-20 DIAGNOSIS — Z7902 Long term (current) use of antithrombotics/antiplatelets: Secondary | ICD-10-CM | POA: Diagnosis not present

## 2019-12-25 DIAGNOSIS — Z6830 Body mass index (BMI) 30.0-30.9, adult: Secondary | ICD-10-CM | POA: Diagnosis not present

## 2019-12-25 DIAGNOSIS — R319 Hematuria, unspecified: Secondary | ICD-10-CM | POA: Diagnosis not present

## 2019-12-25 DIAGNOSIS — N390 Urinary tract infection, site not specified: Secondary | ICD-10-CM | POA: Diagnosis not present

## 2020-01-01 ENCOUNTER — Other Ambulatory Visit: Payer: Self-pay

## 2020-01-01 ENCOUNTER — Ambulatory Visit (INDEPENDENT_AMBULATORY_CARE_PROVIDER_SITE_OTHER): Payer: Medicare HMO | Admitting: Urology

## 2020-01-01 VITALS — Temp 97.0°F

## 2020-01-01 DIAGNOSIS — R339 Retention of urine, unspecified: Secondary | ICD-10-CM | POA: Diagnosis not present

## 2020-01-01 NOTE — Progress Notes (Signed)
Cath Change/ Replacement  Patient is present today for a catheter change due to urinary retention.  79ml of water was removed from the balloon, a 16 coudeFR foley cath was removed with out difficulty.  Patient was cleaned and prepped in a sterile fashion with betadine. A 16 coude FR foley cath was replaced into the bladder no complications were noted Urine return was noted 29ml and urine was yellow in color. The balloon was filled with 65ml of sterile water. A leg bag was attached for drainage. Pt. Was given 2 leg bags extra. Patient was given proper instruction on catheter care.    Performed by: Valentina Lucks, LPN  Follow up: I month  NV cath change

## 2020-01-04 DIAGNOSIS — R339 Retention of urine, unspecified: Secondary | ICD-10-CM | POA: Insufficient documentation

## 2020-01-29 ENCOUNTER — Other Ambulatory Visit: Payer: Self-pay

## 2020-01-29 ENCOUNTER — Ambulatory Visit (INDEPENDENT_AMBULATORY_CARE_PROVIDER_SITE_OTHER): Payer: Medicare HMO

## 2020-01-29 DIAGNOSIS — R339 Retention of urine, unspecified: Secondary | ICD-10-CM

## 2020-01-29 NOTE — Progress Notes (Signed)
Cath Change/ Replacement  Patient is present today for a catheter change due to urinary retention.  51ml of water was removed from the balloon, a 16 coude FR foley cath was removed with out difficulty.  Patient was cleaned and prepped in a sterile fashion with betadine. A 16 coude FR foley cath was replaced into the bladder no complications were noted Urine return was noted 20 ml and urine was yellow in color. The balloon was filled with 35ml of sterile water. A leg bag was attached for drainage.  A night bag was also given to the patient and patient was given instruction on how to change from one bag to another. Patient was given proper instruction on catheter care.    Performed by: Jeslynn Hollander, lpn  Follow up: 1 month

## 2020-01-31 ENCOUNTER — Ambulatory Visit: Payer: Medicare HMO

## 2020-02-05 DIAGNOSIS — I10 Essential (primary) hypertension: Secondary | ICD-10-CM | POA: Diagnosis not present

## 2020-02-05 DIAGNOSIS — K219 Gastro-esophageal reflux disease without esophagitis: Secondary | ICD-10-CM | POA: Diagnosis not present

## 2020-02-05 DIAGNOSIS — E7849 Other hyperlipidemia: Secondary | ICD-10-CM | POA: Diagnosis not present

## 2020-02-05 DIAGNOSIS — G40909 Epilepsy, unspecified, not intractable, without status epilepticus: Secondary | ICD-10-CM | POA: Diagnosis not present

## 2020-02-21 DIAGNOSIS — Z23 Encounter for immunization: Secondary | ICD-10-CM | POA: Diagnosis not present

## 2020-02-28 ENCOUNTER — Other Ambulatory Visit: Payer: Self-pay

## 2020-02-28 ENCOUNTER — Ambulatory Visit (INDEPENDENT_AMBULATORY_CARE_PROVIDER_SITE_OTHER): Payer: Medicare HMO

## 2020-02-28 DIAGNOSIS — R339 Retention of urine, unspecified: Secondary | ICD-10-CM

## 2020-02-28 NOTE — Progress Notes (Signed)
Cath Change/ Replacement  Patient is present today for a catheter change due to urinary retention.  50ml of water was removed from the balloon, a 16 coudeFR foley cath was removed with out difficulty.  Patient was cleaned and prepped in a sterile fashion with betadine. A 16 coude FR foley cath was replaced into the bladder no complications were noted Urine return was noted 45ml and urine was yellow in color. The balloon was filled with 63ml of sterile water. A leg bag was attached for drainage.  Patient was given proper instruction on catheter care.    Performed by: Valentina Lucks. LPN

## 2020-03-06 DIAGNOSIS — K219 Gastro-esophageal reflux disease without esophagitis: Secondary | ICD-10-CM | POA: Diagnosis not present

## 2020-03-06 DIAGNOSIS — F331 Major depressive disorder, recurrent, moderate: Secondary | ICD-10-CM | POA: Diagnosis not present

## 2020-03-06 DIAGNOSIS — G40909 Epilepsy, unspecified, not intractable, without status epilepticus: Secondary | ICD-10-CM | POA: Diagnosis not present

## 2020-03-06 DIAGNOSIS — I10 Essential (primary) hypertension: Secondary | ICD-10-CM | POA: Diagnosis not present

## 2020-03-06 DIAGNOSIS — E7849 Other hyperlipidemia: Secondary | ICD-10-CM | POA: Diagnosis not present

## 2020-03-18 DIAGNOSIS — E039 Hypothyroidism, unspecified: Secondary | ICD-10-CM | POA: Diagnosis not present

## 2020-03-18 DIAGNOSIS — E785 Hyperlipidemia, unspecified: Secondary | ICD-10-CM | POA: Diagnosis not present

## 2020-03-18 DIAGNOSIS — J449 Chronic obstructive pulmonary disease, unspecified: Secondary | ICD-10-CM | POA: Diagnosis not present

## 2020-03-18 DIAGNOSIS — G8324 Monoplegia of upper limb affecting left nondominant side: Secondary | ICD-10-CM | POA: Diagnosis not present

## 2020-03-18 DIAGNOSIS — N390 Urinary tract infection, site not specified: Secondary | ICD-10-CM | POA: Diagnosis not present

## 2020-03-18 DIAGNOSIS — G40909 Epilepsy, unspecified, not intractable, without status epilepticus: Secondary | ICD-10-CM | POA: Diagnosis not present

## 2020-03-18 DIAGNOSIS — F330 Major depressive disorder, recurrent, mild: Secondary | ICD-10-CM | POA: Diagnosis not present

## 2020-03-18 DIAGNOSIS — I10 Essential (primary) hypertension: Secondary | ICD-10-CM | POA: Diagnosis not present

## 2020-03-18 DIAGNOSIS — D692 Other nonthrombocytopenic purpura: Secondary | ICD-10-CM | POA: Diagnosis not present

## 2020-03-20 DIAGNOSIS — Z23 Encounter for immunization: Secondary | ICD-10-CM | POA: Diagnosis not present

## 2020-04-03 ENCOUNTER — Ambulatory Visit: Payer: Medicare HMO

## 2020-04-05 DIAGNOSIS — G40909 Epilepsy, unspecified, not intractable, without status epilepticus: Secondary | ICD-10-CM | POA: Diagnosis not present

## 2020-04-05 DIAGNOSIS — I10 Essential (primary) hypertension: Secondary | ICD-10-CM | POA: Diagnosis not present

## 2020-04-05 DIAGNOSIS — E7849 Other hyperlipidemia: Secondary | ICD-10-CM | POA: Diagnosis not present

## 2020-04-05 DIAGNOSIS — F331 Major depressive disorder, recurrent, moderate: Secondary | ICD-10-CM | POA: Diagnosis not present

## 2020-04-05 DIAGNOSIS — K219 Gastro-esophageal reflux disease without esophagitis: Secondary | ICD-10-CM | POA: Diagnosis not present

## 2020-04-09 ENCOUNTER — Other Ambulatory Visit: Payer: Self-pay

## 2020-04-09 ENCOUNTER — Ambulatory Visit (INDEPENDENT_AMBULATORY_CARE_PROVIDER_SITE_OTHER): Payer: Medicare HMO

## 2020-04-09 DIAGNOSIS — R339 Retention of urine, unspecified: Secondary | ICD-10-CM

## 2020-04-09 NOTE — Patient Instructions (Signed)

## 2020-04-09 NOTE — Progress Notes (Signed)
Cath Change/ Replacement  Patient is present today for a catheter change due to urinary retention.  15ml of water was removed from the balloon, a 16FR coude foley cath was removed with out difficulty.  Patient was cleaned and prepped in a sterile fashion with betadine. A 16 FR coude foley cath was replaced into the bladder no complications were noted Urine return was noted 63ml and urine was yellow in color. The balloon was filled with 44ml of sterile water. A leg bag was attached for drainage. Patient was given proper instruction on catheter care.    Performed by: Akeiba Axelson, LPN  Follow up: Keep next scheduled NV

## 2020-04-10 ENCOUNTER — Ambulatory Visit: Payer: Medicare HMO

## 2020-05-07 DIAGNOSIS — F331 Major depressive disorder, recurrent, moderate: Secondary | ICD-10-CM | POA: Diagnosis not present

## 2020-05-07 DIAGNOSIS — K219 Gastro-esophageal reflux disease without esophagitis: Secondary | ICD-10-CM | POA: Diagnosis not present

## 2020-05-07 DIAGNOSIS — E7849 Other hyperlipidemia: Secondary | ICD-10-CM | POA: Diagnosis not present

## 2020-05-07 DIAGNOSIS — I10 Essential (primary) hypertension: Secondary | ICD-10-CM | POA: Diagnosis not present

## 2020-05-08 ENCOUNTER — Other Ambulatory Visit: Payer: Self-pay

## 2020-05-08 ENCOUNTER — Ambulatory Visit (INDEPENDENT_AMBULATORY_CARE_PROVIDER_SITE_OTHER): Payer: Medicare HMO

## 2020-05-08 DIAGNOSIS — R339 Retention of urine, unspecified: Secondary | ICD-10-CM | POA: Diagnosis not present

## 2020-05-08 NOTE — Progress Notes (Signed)
Cath Change/ Replacement  Patient is present today for a catheter change due to urinary retention.  43ml of water was removed from the balloon, a 16 FR coude foley cath was removed with out difficulty.  Patient was cleaned and prepped in a sterile fashion with betadine. A 16 FR coude foley cath was replaced into the bladder no complications were noted Urine return was noted 29ml and urine was straw in color. The balloon was filled with 53ml of sterile water. A leg bag was attached for drainage. Patient was given proper instruction on catheter care.    Performed by: Jericha Bryden,LPN  Follow up: Keep next scheduled appt

## 2020-05-20 DIAGNOSIS — Z7902 Long term (current) use of antithrombotics/antiplatelets: Secondary | ICD-10-CM | POA: Diagnosis not present

## 2020-05-20 DIAGNOSIS — Z6827 Body mass index (BMI) 27.0-27.9, adult: Secondary | ICD-10-CM | POA: Diagnosis not present

## 2020-05-20 DIAGNOSIS — F330 Major depressive disorder, recurrent, mild: Secondary | ICD-10-CM | POA: Diagnosis not present

## 2020-05-20 DIAGNOSIS — I10 Essential (primary) hypertension: Secondary | ICD-10-CM | POA: Diagnosis not present

## 2020-05-20 DIAGNOSIS — E039 Hypothyroidism, unspecified: Secondary | ICD-10-CM | POA: Diagnosis not present

## 2020-05-20 DIAGNOSIS — D692 Other nonthrombocytopenic purpura: Secondary | ICD-10-CM | POA: Diagnosis not present

## 2020-05-20 DIAGNOSIS — E785 Hyperlipidemia, unspecified: Secondary | ICD-10-CM | POA: Diagnosis not present

## 2020-05-20 DIAGNOSIS — J449 Chronic obstructive pulmonary disease, unspecified: Secondary | ICD-10-CM | POA: Diagnosis not present

## 2020-05-20 DIAGNOSIS — M353 Polymyalgia rheumatica: Secondary | ICD-10-CM | POA: Diagnosis not present

## 2020-06-05 ENCOUNTER — Other Ambulatory Visit: Payer: Self-pay

## 2020-06-05 ENCOUNTER — Ambulatory Visit (INDEPENDENT_AMBULATORY_CARE_PROVIDER_SITE_OTHER): Payer: Medicare HMO

## 2020-06-05 DIAGNOSIS — N390 Urinary tract infection, site not specified: Secondary | ICD-10-CM | POA: Diagnosis not present

## 2020-06-05 DIAGNOSIS — R109 Unspecified abdominal pain: Secondary | ICD-10-CM | POA: Diagnosis not present

## 2020-06-05 MED ORDER — NITROFURANTOIN MONOHYD MACRO 100 MG PO CAPS: 100.0000 mg | ORAL_CAPSULE | Freq: Two times a day (BID) | ORAL | 0 refills | Status: AC

## 2020-06-05 NOTE — Progress Notes (Signed)
Cath Change/ Replacement  Patient is present today for a catheter change due to urinary retention.  9ml of water was removed from the balloon, a 16FR coude foley cath was removed with out difficulty.  Patient was cleaned and prepped in a sterile fashion with betadine. A 16 FR foley cath was replaced into the bladder no complications were noted Urine return was noted 168ml and urine was straw  in color. The balloon was filled with 65ml of sterile water. A leg bag was attached for drainage.   Patient was given proper instruction on catheter care.    Performed by: Catina Nuss,LPN   Follow up: 1 month cath change.  Pt complain of foul smelling urine, burning, and lower back pain. Urine culture obtained and order placed for CT stone study per Dr. Alyson Ingles

## 2020-06-06 DIAGNOSIS — I10 Essential (primary) hypertension: Secondary | ICD-10-CM | POA: Diagnosis not present

## 2020-06-06 DIAGNOSIS — E7849 Other hyperlipidemia: Secondary | ICD-10-CM | POA: Diagnosis not present

## 2020-06-06 DIAGNOSIS — F331 Major depressive disorder, recurrent, moderate: Secondary | ICD-10-CM | POA: Diagnosis not present

## 2020-06-06 DIAGNOSIS — K219 Gastro-esophageal reflux disease without esophagitis: Secondary | ICD-10-CM | POA: Diagnosis not present

## 2020-06-07 DIAGNOSIS — Z0001 Encounter for general adult medical examination with abnormal findings: Secondary | ICD-10-CM | POA: Diagnosis not present

## 2020-06-07 DIAGNOSIS — M545 Low back pain, unspecified: Secondary | ICD-10-CM | POA: Diagnosis not present

## 2020-06-07 DIAGNOSIS — E782 Mixed hyperlipidemia: Secondary | ICD-10-CM | POA: Diagnosis not present

## 2020-06-07 DIAGNOSIS — Z23 Encounter for immunization: Secondary | ICD-10-CM | POA: Diagnosis not present

## 2020-06-07 DIAGNOSIS — K5901 Slow transit constipation: Secondary | ICD-10-CM | POA: Diagnosis not present

## 2020-06-07 DIAGNOSIS — G40309 Generalized idiopathic epilepsy and epileptic syndromes, not intractable, without status epilepticus: Secondary | ICD-10-CM | POA: Diagnosis not present

## 2020-06-07 DIAGNOSIS — G8114 Spastic hemiplegia affecting left nondominant side: Secondary | ICD-10-CM | POA: Diagnosis not present

## 2020-06-07 DIAGNOSIS — D500 Iron deficiency anemia secondary to blood loss (chronic): Secondary | ICD-10-CM | POA: Diagnosis not present

## 2020-06-07 DIAGNOSIS — F331 Major depressive disorder, recurrent, moderate: Secondary | ICD-10-CM | POA: Diagnosis not present

## 2020-06-07 DIAGNOSIS — I10 Essential (primary) hypertension: Secondary | ICD-10-CM | POA: Diagnosis not present

## 2020-06-07 DIAGNOSIS — D692 Other nonthrombocytopenic purpura: Secondary | ICD-10-CM | POA: Diagnosis not present

## 2020-06-09 LAB — URINE CULTURE

## 2020-06-14 ENCOUNTER — Other Ambulatory Visit: Payer: Self-pay

## 2020-06-14 ENCOUNTER — Ambulatory Visit (HOSPITAL_COMMUNITY)
Admission: RE | Admit: 2020-06-14 | Discharge: 2020-06-14 | Disposition: A | Discharge status: Home / Self Care | Payer: Medicare HMO | Source: Ambulatory Visit | Attending: Urology | Admitting: Urology

## 2020-06-14 DIAGNOSIS — R109 Unspecified abdominal pain: Secondary | ICD-10-CM | POA: Diagnosis not present

## 2020-06-14 DIAGNOSIS — K5790 Diverticulosis of intestine, part unspecified, without perforation or abscess without bleeding: Secondary | ICD-10-CM | POA: Diagnosis not present

## 2020-06-14 DIAGNOSIS — N400 Enlarged prostate without lower urinary tract symptoms: Secondary | ICD-10-CM | POA: Diagnosis not present

## 2020-06-14 DIAGNOSIS — K429 Umbilical hernia without obstruction or gangrene: Secondary | ICD-10-CM | POA: Diagnosis not present

## 2020-06-14 DIAGNOSIS — N323 Diverticulum of bladder: Secondary | ICD-10-CM | POA: Diagnosis not present

## 2020-07-03 ENCOUNTER — Other Ambulatory Visit: Payer: Self-pay

## 2020-07-03 ENCOUNTER — Ambulatory Visit (INDEPENDENT_AMBULATORY_CARE_PROVIDER_SITE_OTHER): Payer: Medicare HMO

## 2020-07-03 DIAGNOSIS — N390 Urinary tract infection, site not specified: Secondary | ICD-10-CM | POA: Diagnosis not present

## 2020-07-03 MED ORDER — CEFPODOXIME PROXETIL 100 MG PO TABS: 100.0000 mg | ORAL_TABLET | Freq: Two times a day (BID) | ORAL | 0 refills | Status: AC

## 2020-07-03 NOTE — Progress Notes (Signed)
Cath Change/ Replacement  Patient is present today for a catheter change due to urinary retention.  33ml of water was removed from the balloon, a 16 coudeFR foley cath was removed with out difficulty.  Patient was cleaned and prepped in a sterile fashion with betadine. A 16 coude FR foley cath was replaced into the bladder no complications were noted Urine return was noted 49ml and urine was yellow in color. The balloon was filled with 33ml of sterile water. A leg bag was attached for drainage. Patient was given proper instruction on catheter care.    Performed by: Euna Armon,LPN  Follow up:1 month

## 2020-07-06 DIAGNOSIS — I10 Essential (primary) hypertension: Secondary | ICD-10-CM | POA: Diagnosis not present

## 2020-07-06 DIAGNOSIS — K219 Gastro-esophageal reflux disease without esophagitis: Secondary | ICD-10-CM | POA: Diagnosis not present

## 2020-07-06 DIAGNOSIS — F331 Major depressive disorder, recurrent, moderate: Secondary | ICD-10-CM | POA: Diagnosis not present

## 2020-07-06 DIAGNOSIS — E7849 Other hyperlipidemia: Secondary | ICD-10-CM | POA: Diagnosis not present

## 2020-07-31 ENCOUNTER — Other Ambulatory Visit: Payer: Self-pay

## 2020-07-31 ENCOUNTER — Ambulatory Visit (INDEPENDENT_AMBULATORY_CARE_PROVIDER_SITE_OTHER): Payer: Medicare HMO

## 2020-07-31 DIAGNOSIS — R339 Retention of urine, unspecified: Secondary | ICD-10-CM

## 2020-07-31 NOTE — Progress Notes (Signed)
Cath Change/ Replacement  Patient is present today for a catheter change due to urinary retention.  56ml of water was removed from the balloon, a 16 coudeFR foley cath was removed with out difficulty.  Patient was cleaned and prepped in a sterile fashion with betadine. A 16 coude FR foley cath was replaced into the bladder no complications were noted Urine return was noted 82ml and urine was yellow in color. The balloon was filled with 47ml of sterile water. A leg bag was attached for drainage. Patient was given proper instruction on catheter care.    Performed by: Ozie Dimaria,LPN  Follow up:  14month

## 2020-08-02 ENCOUNTER — Ambulatory Visit: Payer: Medicare HMO

## 2020-08-06 ENCOUNTER — Ambulatory Visit: Payer: Medicare HMO

## 2020-08-06 DIAGNOSIS — E7849 Other hyperlipidemia: Secondary | ICD-10-CM | POA: Diagnosis not present

## 2020-08-06 DIAGNOSIS — I10 Essential (primary) hypertension: Secondary | ICD-10-CM | POA: Diagnosis not present

## 2020-08-06 DIAGNOSIS — F331 Major depressive disorder, recurrent, moderate: Secondary | ICD-10-CM | POA: Diagnosis not present

## 2020-08-08 DIAGNOSIS — I10 Essential (primary) hypertension: Secondary | ICD-10-CM | POA: Diagnosis not present

## 2020-08-08 DIAGNOSIS — Z7902 Long term (current) use of antithrombotics/antiplatelets: Secondary | ICD-10-CM | POA: Diagnosis not present

## 2020-08-08 DIAGNOSIS — N390 Urinary tract infection, site not specified: Secondary | ICD-10-CM | POA: Diagnosis not present

## 2020-08-08 DIAGNOSIS — J449 Chronic obstructive pulmonary disease, unspecified: Secondary | ICD-10-CM | POA: Diagnosis not present

## 2020-08-08 DIAGNOSIS — D692 Other nonthrombocytopenic purpura: Secondary | ICD-10-CM | POA: Diagnosis not present

## 2020-08-08 DIAGNOSIS — Z6827 Body mass index (BMI) 27.0-27.9, adult: Secondary | ICD-10-CM | POA: Diagnosis not present

## 2020-08-08 DIAGNOSIS — Z936 Other artificial openings of urinary tract status: Secondary | ICD-10-CM | POA: Diagnosis not present

## 2020-08-20 DIAGNOSIS — Z09 Encounter for follow-up examination after completed treatment for conditions other than malignant neoplasm: Secondary | ICD-10-CM | POA: Diagnosis not present

## 2020-08-20 DIAGNOSIS — Z87440 Personal history of urinary (tract) infections: Secondary | ICD-10-CM | POA: Diagnosis not present

## 2020-09-05 ENCOUNTER — Ambulatory Visit: Payer: Medicare HMO

## 2020-09-06 DIAGNOSIS — F331 Major depressive disorder, recurrent, moderate: Secondary | ICD-10-CM | POA: Diagnosis not present

## 2020-09-06 DIAGNOSIS — E7849 Other hyperlipidemia: Secondary | ICD-10-CM | POA: Diagnosis not present

## 2020-09-06 DIAGNOSIS — K219 Gastro-esophageal reflux disease without esophagitis: Secondary | ICD-10-CM | POA: Diagnosis not present

## 2020-09-06 DIAGNOSIS — I10 Essential (primary) hypertension: Secondary | ICD-10-CM | POA: Diagnosis not present

## 2020-09-10 ENCOUNTER — Ambulatory Visit: Payer: Medicare HMO

## 2020-09-11 ENCOUNTER — Ambulatory Visit (INDEPENDENT_AMBULATORY_CARE_PROVIDER_SITE_OTHER): Payer: Medicare HMO

## 2020-09-11 ENCOUNTER — Other Ambulatory Visit: Payer: Self-pay

## 2020-09-11 DIAGNOSIS — R339 Retention of urine, unspecified: Secondary | ICD-10-CM | POA: Diagnosis not present

## 2020-09-11 NOTE — Progress Notes (Signed)
Cath Change/ Replacement  Patient is present today for a catheter change due to urinary retention.  73ml of water was removed from the balloon, a 16 coudeFR foley cath was removed with out difficulty.  Patient was cleaned and prepped in a sterile fashion with betadine. A 16 coude FR foley cath was replaced into the bladder no complications were noted Urine return was noted 70ml and urine was yellow in color. The balloon was filled with 34ml of sterile water. A leg bag was attached for drainage. Patient was given proper instruction on catheter care.    Performed by: Dalia Heading, LPN  Follow up:  1 month

## 2020-09-20 DIAGNOSIS — I69334 Monoplegia of upper limb following cerebral infarction affecting left non-dominant side: Secondary | ICD-10-CM | POA: Diagnosis not present

## 2020-09-20 DIAGNOSIS — F330 Major depressive disorder, recurrent, mild: Secondary | ICD-10-CM | POA: Diagnosis not present

## 2020-09-20 DIAGNOSIS — G40909 Epilepsy, unspecified, not intractable, without status epilepticus: Secondary | ICD-10-CM | POA: Diagnosis not present

## 2020-09-20 DIAGNOSIS — Z7902 Long term (current) use of antithrombotics/antiplatelets: Secondary | ICD-10-CM | POA: Diagnosis not present

## 2020-09-20 DIAGNOSIS — Z960 Presence of urogenital implants: Secondary | ICD-10-CM | POA: Diagnosis not present

## 2020-09-20 DIAGNOSIS — I739 Peripheral vascular disease, unspecified: Secondary | ICD-10-CM | POA: Diagnosis not present

## 2020-09-20 DIAGNOSIS — D692 Other nonthrombocytopenic purpura: Secondary | ICD-10-CM | POA: Diagnosis not present

## 2020-09-20 DIAGNOSIS — J449 Chronic obstructive pulmonary disease, unspecified: Secondary | ICD-10-CM | POA: Diagnosis not present

## 2020-10-02 ENCOUNTER — Ambulatory Visit: Payer: Medicare HMO

## 2020-10-05 DIAGNOSIS — F331 Major depressive disorder, recurrent, moderate: Secondary | ICD-10-CM | POA: Diagnosis not present

## 2020-10-05 DIAGNOSIS — K219 Gastro-esophageal reflux disease without esophagitis: Secondary | ICD-10-CM | POA: Diagnosis not present

## 2020-10-05 DIAGNOSIS — G40909 Epilepsy, unspecified, not intractable, without status epilepticus: Secondary | ICD-10-CM | POA: Diagnosis not present

## 2020-10-05 DIAGNOSIS — I10 Essential (primary) hypertension: Secondary | ICD-10-CM | POA: Diagnosis not present

## 2020-10-05 DIAGNOSIS — E7849 Other hyperlipidemia: Secondary | ICD-10-CM | POA: Diagnosis not present

## 2020-10-07 DIAGNOSIS — E7849 Other hyperlipidemia: Secondary | ICD-10-CM | POA: Diagnosis not present

## 2020-10-07 DIAGNOSIS — G40909 Epilepsy, unspecified, not intractable, without status epilepticus: Secondary | ICD-10-CM | POA: Diagnosis not present

## 2020-10-07 DIAGNOSIS — E782 Mixed hyperlipidemia: Secondary | ICD-10-CM | POA: Diagnosis not present

## 2020-10-07 DIAGNOSIS — R7301 Impaired fasting glucose: Secondary | ICD-10-CM | POA: Diagnosis not present

## 2020-10-10 ENCOUNTER — Ambulatory Visit: Payer: Medicare HMO

## 2020-10-18 DIAGNOSIS — G8114 Spastic hemiplegia affecting left nondominant side: Secondary | ICD-10-CM | POA: Diagnosis not present

## 2020-10-18 DIAGNOSIS — D500 Iron deficiency anemia secondary to blood loss (chronic): Secondary | ICD-10-CM | POA: Diagnosis not present

## 2020-10-18 DIAGNOSIS — E7849 Other hyperlipidemia: Secondary | ICD-10-CM | POA: Diagnosis not present

## 2020-10-18 DIAGNOSIS — K5901 Slow transit constipation: Secondary | ICD-10-CM | POA: Diagnosis not present

## 2020-10-18 DIAGNOSIS — F331 Major depressive disorder, recurrent, moderate: Secondary | ICD-10-CM | POA: Diagnosis not present

## 2020-10-18 DIAGNOSIS — D692 Other nonthrombocytopenic purpura: Secondary | ICD-10-CM | POA: Diagnosis not present

## 2020-10-18 DIAGNOSIS — I10 Essential (primary) hypertension: Secondary | ICD-10-CM | POA: Diagnosis not present

## 2020-10-18 DIAGNOSIS — G40309 Generalized idiopathic epilepsy and epileptic syndromes, not intractable, without status epilepticus: Secondary | ICD-10-CM | POA: Diagnosis not present

## 2020-10-22 ENCOUNTER — Ambulatory Visit (INDEPENDENT_AMBULATORY_CARE_PROVIDER_SITE_OTHER): Payer: Medicare HMO

## 2020-10-22 ENCOUNTER — Other Ambulatory Visit: Payer: Self-pay

## 2020-10-22 DIAGNOSIS — R339 Retention of urine, unspecified: Secondary | ICD-10-CM

## 2020-10-22 NOTE — Progress Notes (Signed)
Cath Change/ Replacement  Patient is present today for a catheter change due to urinary retention.  52ml of water was removed from the balloon, a 16FR coude foley cath was removed with out difficulty.  Patient was cleaned and prepped in a sterile fashion with betadine. A 16 FR coude foley cath was replaced into the bladder no complications were noted Urine return was noted 156ml and urine was yellow in color. The balloon was filled with 37ml of sterile water. A leg bag was attached for drainage.  Patient was given proper instruction on catheter care.    Performed by: Yasenia Reedy, lpn  Follow up: Keep next scheduled NV

## 2020-10-30 ENCOUNTER — Ambulatory Visit: Payer: Medicare HMO

## 2020-11-04 DIAGNOSIS — K219 Gastro-esophageal reflux disease without esophagitis: Secondary | ICD-10-CM | POA: Diagnosis not present

## 2020-11-04 DIAGNOSIS — G40909 Epilepsy, unspecified, not intractable, without status epilepticus: Secondary | ICD-10-CM | POA: Diagnosis not present

## 2020-11-04 DIAGNOSIS — F331 Major depressive disorder, recurrent, moderate: Secondary | ICD-10-CM | POA: Diagnosis not present

## 2020-11-04 DIAGNOSIS — E7849 Other hyperlipidemia: Secondary | ICD-10-CM | POA: Diagnosis not present

## 2020-11-04 DIAGNOSIS — I10 Essential (primary) hypertension: Secondary | ICD-10-CM | POA: Diagnosis not present

## 2020-11-07 ENCOUNTER — Ambulatory Visit: Payer: Medicare HMO

## 2020-11-18 ENCOUNTER — Ambulatory Visit: Payer: Medicare HMO

## 2020-11-27 ENCOUNTER — Ambulatory Visit: Payer: Medicare HMO

## 2020-12-04 DIAGNOSIS — G40909 Epilepsy, unspecified, not intractable, without status epilepticus: Secondary | ICD-10-CM | POA: Diagnosis not present

## 2020-12-04 DIAGNOSIS — E7849 Other hyperlipidemia: Secondary | ICD-10-CM | POA: Diagnosis not present

## 2020-12-04 DIAGNOSIS — K219 Gastro-esophageal reflux disease without esophagitis: Secondary | ICD-10-CM | POA: Diagnosis not present

## 2020-12-04 DIAGNOSIS — I10 Essential (primary) hypertension: Secondary | ICD-10-CM | POA: Diagnosis not present

## 2020-12-04 DIAGNOSIS — F331 Major depressive disorder, recurrent, moderate: Secondary | ICD-10-CM | POA: Diagnosis not present

## 2020-12-05 ENCOUNTER — Ambulatory Visit: Payer: Medicare HMO

## 2020-12-06 ENCOUNTER — Encounter: Payer: Self-pay | Admitting: Urology

## 2020-12-06 ENCOUNTER — Ambulatory Visit (INDEPENDENT_AMBULATORY_CARE_PROVIDER_SITE_OTHER): Payer: Medicare HMO | Admitting: Urology

## 2020-12-06 ENCOUNTER — Ambulatory Visit: Payer: Medicare HMO

## 2020-12-06 ENCOUNTER — Other Ambulatory Visit: Payer: Self-pay

## 2020-12-06 VITALS — BP 125/71 | HR 55 | Temp 97.7°F | Ht 72.0 in | Wt 216.0 lb

## 2020-12-06 DIAGNOSIS — R339 Retention of urine, unspecified: Secondary | ICD-10-CM

## 2020-12-06 NOTE — Patient Instructions (Signed)
Indwelling Urinary Catheter Care, Adult °An indwelling urinary catheter is a thin tube that is put into your bladder. The tube helps to drain pee (urine) out of your body. The tube goes in through your urethra. Your urethra is where pee comes out of your body. Your pee will come out through the catheter, then it will go into a bag (drainage bag). °Take good care of your catheter so it will work well. °How to wear your catheter and bag °Supplies needed °· Sticky tape (adhesive tape) or a leg strap. °· Alcohol wipe or soap and water (if you use tape). °· A clean towel (if you use tape). °· Large overnight bag. °· Smaller bag (leg bag). °Wearing your catheter °Attach your catheter to your leg with tape or a leg strap. °· Make sure the catheter is not pulled tight. °· If a leg strap gets wet, take it off and put on a dry strap. °· If you use tape to hold the bag on your leg: °1. Use an alcohol wipe or soap and water to wash your skin where the tape made it sticky before. °2. Use a clean towel to pat-dry that skin. °3. Use new tape to make the bag stay on your leg. °Wearing your bags °You should have been given a large overnight bag. °· You may wear the overnight bag in the day or night. °· Always have the overnight bag lower than your bladder.  Do not let the bag touch the floor. °· Before you go to sleep, put a clean plastic bag in a wastebasket. Then hang the overnight bag inside the wastebasket. °You should also have a smaller leg bag that fits under your clothes. °· Always wear the leg bag below your knee. °· Do not wear your leg bag at night. °How to care for your skin and catheter °Supplies needed °· A clean washcloth. °· Water and mild soap. °· A clean towel. °Caring for your skin and catheter °· Clean the skin around your catheter every day: °1. Wash your hands with soap and water. °2. Wet a clean washcloth in warm water and mild soap. °3. Clean the skin around your urethra. °§ If you are male: °§ Gently  spread the folds of skin around your vagina (labia). °§ With the washcloth in your other hand, wipe the inner side of your labia on each side. Wipe from front to back. °§ If you are male: °§ Pull back any skin that covers the end of your penis (foreskin). °§ With the washcloth in your other hand, wipe your penis in small circles. Start wiping at the tip of your penis, then move away from the catheter. °§ Move the foreskin back in place, if needed. °4. With your free hand, hold the catheter close to where it goes into your body. °§ Keep holding the catheter during cleaning so it does not get pulled out. °5. With the washcloth in your other hand, clean the catheter. °§ Only wipe downward on the catheter. °§ Do not wipe upward toward your body. Doing this may push germs into your urethra and cause infection. °6. Use a clean towel to pat-dry the catheter and the skin around it. Make sure to wipe off all soap. °7. Wash your hands with soap and water. °· Shower every day. Do not take baths. °· Do not use cream, ointment, or lotion on the area where the catheter goes into your body, unless your doctor tells you to. °· Do not   use powders, sprays, or lotions on your genital area. °· Check your skin around the catheter every day for signs of infection. Check for: °? Redness, swelling, or pain. °? Fluid or blood. °? Warmth. °? Pus or a bad smell.  °  °  °How to empty the bag °Supplies needed °· Rubbing alcohol. °· Gauze pad or cotton ball. °· Tape or a leg strap. °Emptying the bag °Pour the pee out of your bag when it is ?-½ full, or at least 2-3 times a day. Do this for your overnight bag and your leg bag. °1. Wash your hands with soap and water. °2. Separate (detach) the bag from your leg. °3. Hold the bag over the toilet or a clean pail. Keep the bag lower than your hips and bladder. This is so the pee (urine) does not go back into the tube. °4. Open the pour spout. It is at the bottom of the bag. °5. Empty the pee into the  toilet or pail. Do not let the pour spout touch any surface. °6. Put rubbing alcohol on a gauze pad or cotton ball. °7. Use the gauze pad or cotton ball to clean the pour spout. °8. Close the pour spout. °9. Attach the bag to your leg with tape or a leg strap. °10. Wash your hands with soap and water. °Follow instructions for cleaning the drainage bag: °· From the product maker. °· As told by your doctor. °How to change the bag °Supplies needed °· Alcohol wipes. °· A clean bag. °· Tape or a leg strap. °Changing the bag °Replace your bag when it starts to leak, smell bad, or look dirty. °1. Wash your hands with soap and water. °2. Separate the dirty bag from your leg. °3. Pinch the catheter with your fingers so that pee does not spill out. °4. Separate the catheter tube from the bag tube where these tubes connect (at the connection valve). Do not let the tubes touch any surface. °5. Clean the end of the catheter tube with an alcohol wipe. Use a different alcohol wipe to clean the end of the bag tube. °6. Connect the catheter tube to the tube of the clean bag. °7. Attach the clean bag to your leg with tape or a leg strap. Do not make the bag tight on your leg. °8. Wash your hands with soap and water. °General rules °· Never pull on your catheter. Never try to take it out. Doing that can hurt you. °· Always wash your hands before and after you touch your catheter or bag. Use a mild, fragrance-free soap. If you do not have soap and water, use hand sanitizer. °· Always make sure there are no twists or bends (kinks) in the catheter tube. °· Always make sure there are no leaks in the catheter or bag. °· Drink enough fluid to keep your pee pale yellow. °· Do not take baths, swim, or use a hot tub. °· If you are male, wipe from front to back after you poop (have a bowel movement).   °Contact a doctor if: °· Your pee is cloudy. °· Your pee smells worse than usual. °· Your catheter gets clogged. °· Your catheter  leaks. °· Your bladder feels full. °Get help right away if: °· You have redness, swelling, or pain where the catheter goes into your body. °· You have fluid, blood, pus, or a bad smell coming from the area where the catheter goes into your body. °· Your skin feels   warm where the catheter goes into your body. °· You have a fever. °· You have pain in your: °? Belly (abdomen). °? Legs. °? Lower back. °? Bladder. °· You see blood in the catheter. °· Your pee is pink or red. °· You feel sick to your stomach (nauseous). °· You throw up (vomit). °· You have chills. °· Your pee is not draining into the bag. °· Your catheter gets pulled out. °Summary °· An indwelling urinary catheter is a thin tube that is placed into the bladder to help drain pee (urine) out of the body. °· The catheter is placed into the part of the body that drains pee from the bladder (urethra). °· Taking good care of your catheter will keep it working properly and help prevent problems. °· Always wash your hands before and after touching your catheter or bag. °· Never pull on your catheter or try to take it out. °This information is not intended to replace advice given to you by your health care provider. Make sure you discuss any questions you have with your health care provider. °Document Revised: 12/16/2018 Document Reviewed: 04/09/2017 °Elsevier Patient Education © 2021 [employer] ° °

## 2020-12-06 NOTE — Progress Notes (Signed)
Cath Change/ Replacement  Patient is present today for a catheter change due to urinary retention.  34ml of water was removed from the balloon, a 16 coude FR foley cath was removed with out difficulty.  Patient was cleaned and prepped in a sterile fashion with betadine. A 16 coude FR foley cath was replaced into the bladder no complications were noted Urine return was noted 20ml and urine was yellow in color. The balloon was filled with 105ml of sterile water. A leg bag was attached for drainage.  Patient was given proper instruction on catheter care.    Performed by: Aryah Doering, lpn  Follow up: 1 month NV  Urological Symptom Review  Patient is experiencing the following symptoms: none   Review of Systems  Gastrointestinal (upper)  : Negative for upper GI symptoms  Gastrointestinal (lower) : Negative for lower GI symptoms  Constitutional : Negative for symptoms  Skin: Negative for skin symptoms  Eyes: Negative for eye symptoms  Ear/Nose/Throat : Negative for Ear/Nose/Throat symptoms  Hematologic/Lymphatic: Negative for Hematologic/Lymphatic symptoms  Cardiovascular : Negative for cardiovascular symptoms  Respiratory : Negative for respiratory symptoms  Endocrine: Negative for endocrine symptoms  Musculoskeletal: Negative for musculoskeletal symptoms  Neurological: Negative for neurological symptoms  Psychologic: Negative for psychiatric symptoms

## 2020-12-06 NOTE — Progress Notes (Signed)
12/06/2020 12:15 PM   Jimmy Franklin Jun 26, 1934 [id number]  Referring provider: Caryl Bis, MD [address] [place],  [place] [address]  followup urinary retention  HPI: Jimmy Franklin is a 87yo here for followup for urinary retention. He is managed with monthly foley changes. He had 2 UTIs in the past year. He denies any pain with the foley catheter. NO other complaints today.   His records from [facility] are as follows:  I have an enlarged prostate (S/P Surgery).  HPI: Jimmy Franklin is a 87 year-old male established patient who is here for an enlarged prostate after surgical intervention.  He has had a Urolift for treatment of his lower urinary tract symptoms due to his BPH.   This man has had 2 separate TURP procedures. Despite this, he still has significant incomplete emptying of his bladder. He does wear up to 3 diapers a day because of overflow incontinence. He denies any recent gross hematuria or dysuria. He states that Dr. Quillian Quince performed blood tests and that his kidney function has been stable, at least that he is aware of.   Recent renal ultrasound was performed revealing increased cortical echogenicity consistent with medical renal disease, no significant hydronephrosis, renal cystic disease bilaterally. Prevoid volume of the bladder and the diverticulum almost 2 L, post void volume about half of that.   Interval history:   He still has a catheter which has been a bit uncomfortable. He had recent multitrauma after falling and was airlifted ti a [facility].     CC/HPI: This man comes in today for routine check. He has had 2 prior TURPs, and despite having no evidence of obstruction on cystoscopy has not emptied his bladder well. He now has an indwelling catheter which is changed every month. Over the past year he has had no significant difficulty from the indwelling catheter. He has not been treated for infections. He has rare blockage of his catheter. He denies blood in his  urine.   He is here today for check and catheter change   1 complaint from the patient is that his urine in genital region smells foul. He is wondering if taking an antibiotic would help this out. It is mainly the urine did has the smell. He has had no gross hematuria. He has no local bladder pain. Catheter changes have not been difficult     PMH: Past Medical History:  Diagnosis Date  . Bowel habit changes 03/21/2011  . Chronic constipation 08  . COPD (chronic obstructive pulmonary disease) ([place])   . Depression   . Enlarged prostate   . Foley catheter in place 03-17-13  . GERD (gastroesophageal reflux disease)   . Hearing loss of both ears   . Hepatitis    20 yrs ago, states he turned yellow   . Hx of colonic polyps   . Mixed hyperlipidemia   . Polymyalgia rheumatica ([place])   . Seizures ([place])    caused by UTI-none recent- over  2 yr  . Stroke [facility])    CVA- 2013, left sided weakness, mild residual  . Subarachnoid hemorrhage ([place])   . Unspecified essential hypertension     Surgical History: Past Surgical History:  Procedure Laterality Date  . BIOPSY  07/08/2017   Procedure: BIOPSY;  Surgeon: Daneil Dolin, MD;  [facility];  Service: Endoscopy;;  gastric  . CATARACT EXTRACTION, BILATERAL    . CHOLECYSTECTOMY     lap. gallbladder removal  . COLONOSCOPY  07/04/07  . ESOPHAGOGASTRODUODENOSCOPY (EGD) WITH PROPOFOL N/A 07/08/2017   Procedure: ESOPHAGOGASTRODUODENOSCOPY (EGD) WITH PROPOFOL;  Surgeon: Daneil Dolin, MD;  Location[facility];  Service: Endoscopy;  Laterality: N/A;  7:30am  . MALONEY DILATION N/A 07/08/2017   Procedure: Venia Minks DILATION;  Surgeon: Daneil Dolin, MD;  Location: [facility];  Service: Endoscopy;  Laterality: N/A;  . TONSILLECTOMY    . TRANSURETHRAL INCISION OF BLADDER NECK N/A 01/24/2015   Procedure: TRANSURETHRAL INCISION OF BLADDER NECK;  Surgeon: Franchot Gallo, MD;  Location: [facility];  Service: Urology;  Laterality: N/A;   **TRANSURETHRAL RESECTION BLADDER NECK CONTRACTURE**     . TRANSURETHRAL RESECTION OF PROSTATE N/A 03/23/2013   Procedure: TRANSURETHRAL RESECTION OF THE PROSTATE (TURP);  Surgeon: Franchot Gallo, MD;  Location: [facility];  Service: Urology;  Laterality: N/A;    Home Medications:  Allergies as of 12/06/2020   No Known Allergies     Medication List       Accurate as of December 06, 2020 12:15 PM. If you have any questions, ask your nurse or doctor.        buPROPion 150 MG 24 hr tablet Commonly known as: WELLBUTRIN XL   clonazePAM 0.5 MG tablet Commonly known as: KLONOPIN Take 0.5 mg by mouth at bedtime.   clopidogrel 75 MG tablet Commonly known as: PLAVIX Take 75 mg by mouth daily.   escitalopram 20 MG tablet Commonly known as: LEXAPRO Take 20 mg by mouth daily.   furosemide 20 MG tablet Commonly known as: LASIX Take 20 mg by mouth every morning.   liothyronine 25 MCG tablet Commonly known as: CYTOMEL Take 25 mcg by mouth daily.   lisinopril 20 MG tablet Commonly known as: ZESTRIL Take 20 mg by mouth every morning.   metoprolol succinate 50 MG 24 hr tablet Commonly known as: TOPROL-XL Take 50 mg by mouth 2 (two) times daily. Take with or immediately following a meal.   pantoprazole 40 MG tablet Commonly known as: PROTONIX TAKE 1 TABLET BY MOUTH ONCE DAILY.   phenytoin 100 MG ER capsule Commonly known as: DILANTIN Take 200 mg by mouth 2 (two) times daily.   ranitidine 300 MG tablet Commonly known as: ZANTAC Take 300 mg by mouth daily.   sertraline 100 MG tablet Commonly known as: ZOLOFT   simvastatin 40 MG tablet Commonly known as: ZOCOR Take 40 mg by mouth daily.   sulfamethoxazole-trimethoprim 800-160 MG tablet Commonly known as: BACTRIM DS Take 1 tablet by mouth every 12 (twelve) hours.       Allergies: No Known Allergies  Family History: Family History  Problem Relation Age of Onset  . Colon cancer Mother   . Colon cancer Father      Social History:  reports that he quit smoking about 53 years ago. His smoking use included cigarettes. He has a 45.00 pack-year smoking history. He has quit using smokeless tobacco. He reports that he does not drink alcohol and does not use drugs.  ROS: All other review of systems were reviewed and are negative except what is noted above in HPI  Physical Exam: BP 125/71   Pulse (!) 55   Temp 97.7 F (36.5 C)   Ht 6' (1.829 m)   Wt 216 lb (98 kg)   BMI 29.29 kg/m   Constitutional:  Alert and oriented, No acute distress. HEENT: [place] AT, moist mucus membranes.  Trachea midline, no masses. Cardiovascular: No clubbing, cyanosis, or edema. Respiratory: Normal respiratory effort, no increased work  of breathing. GI: Abdomen is soft, nontender, nondistended, no abdominal masses GU: No CVA tenderness.  Lymph: No cervical or inguinal lymphadenopathy. Skin: No rashes, bruises or suspicious lesions. Neurologic: Grossly intact, no focal deficits, moving all 4 extremities. Psychiatric: Normal mood and affect.  Laboratory Data: Lab Results  Component Value Date   WBC 10.2 06/12/2017   HGB 13.7 06/12/2017   HCT 41.3 06/12/2017   MCV 84.5 06/12/2017   PLT 121 (L) 06/12/2017    Lab Results  Component Value Date   CREATININE 0.75 06/12/2017    No results found for: PSA  No results found for: TESTOSTERONE  No results found for: HGBA1C  Urinalysis    Component Value Date/Time   COLORURINE YELLOW 05/17/2019 1210   APPEARANCEUR TURBID (A) 05/17/2019 1210   LABSPEC 1.015 05/17/2019 1210   PHURINE 7.0 05/17/2019 1210   GLUCOSEU NEGATIVE 05/17/2019 1210   HGBUR MODERATE (A) 05/17/2019 1210   BILIRUBINUR NEGATIVE 05/17/2019 1210   KETONESUR 5 (A) 05/17/2019 1210   PROTEINUR 100 (A) 05/17/2019 1210   UROBILINOGEN 1.0 02/14/2012 0427   NITRITE NEGATIVE 05/17/2019 1210   LEUKOCYTESUR LARGE (A) 05/17/2019 1210    Lab Results  Component Value Date   BACTERIA MANY (A) 05/17/2019     Pertinent Imaging:  No results found for this or any previous visit.  No results found for this or any previous visit.  No results found for this or any previous visit.  No results found for this or any previous visit.  Results for orders placed during the hospital encounter of 07/06/16  US Renal  Narrative CLINICAL DATA:  History of atonic bladder  EXAM: RENAL / URINARY TRACT ULTRASOUND COMPLETE  COMPARISON:  Abdominal and pelvic CT scan of  January 02, 2015  FINDINGS: Right Kidney:  Length: 12.7 cm. The renal cortical echotexture is mildly increased and is approximately equal to that of the adjacent liver. There is a cystic structure with increased echogenicity of the far wall measuring 1.7 x 1.3 x 1.4 cm. It lies in the midpole. There is no hydronephrosis.  Left Kidney:  Length: 13.8 cm. The renal cortical echotexture is mildly increased and there is cortical thinning similar to that of the right kidney. There are parapelvic cysts and a probable extrarenal pelvis. A midpole cyst measures 1.5 by 1.5 x 1.3 cm. A lower pole cyst measures 1.8 x 2.0 x 1.5 cm.  Bladder:  Bilateral ureteral jets are observed. A large posterior bladder diverticulum is demonstrated and has been previously demonstrated on CT imaging. The prevoid urinary bladder volume is 1953 cc. The postvoid volume is 1704 cc.  IMPRESSION: 1. Mild bilateral renal cortical thinning and increased cortical echotexture consistent with medical renal disease. There is cysts within both kidneys. The cyst on the right exhibits some increased echogenicity in its far wall. There is no definite hydronephrosis. 2. Large posterior urinary bladder diverticulum. Minimal emptying of the urinary bladder with large postvoid residual bladder and diverticulum volume.   Electronically Signed By: David  Martinique M.D. On: 07/06/2016 13:56  No results found for this or any previous visit.  No results found for this  or any previous visit.  Results for orders placed during the hospital encounter of 06/14/20  CT RENAL STONE STUDY  Narrative CLINICAL DATA:  Flank pain.  Evaluate for kidney stones.  EXAM: CT ABDOMEN AND PELVIS WITHOUT CONTRAST  TECHNIQUE: Multidetector CT imaging of the abdomen and pelvis was performed following the standard protocol without IV contrast.  COMPARISON:  01/02/2015  FINDINGS: Lower chest: No acute abnormality. Aortic atherosclerosis. Coronary artery calcifications noted.  Hepatobiliary: No focal liver abnormality is seen. Status post cholecystectomy. No biliary dilatation.  Pancreas: Unremarkable. No pancreatic ductal dilatation or surrounding inflammatory changes.  Spleen: Normal in size without focal abnormality. Several calcified granulomas noted.  Adrenals/Urinary Tract: Normal appearance of the adrenal glands. Bilateral parapelvic cysts are identified. Small exophytic lesion arising from lateral cortex of inferior pole of right kidney is too small to characterize measuring 8 mm. No kidney stones identified bilaterally. No hydronephrosis or hydroureter. No ureteral calculi. Urinary bladder is partially collapsed around a Foley catheter. Again seen is diffuse bladder wall thickening with surrounding soft tissue stranding. 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 Within the dependent portion of this diverticulum there is a 6 mm stone, image 74/2.  Stomach/Bowel: Stomach is nondistended. No bowel wall thickening, inflammation or distension. The appendix is visualized and appears within normal limits.  Vascular/Lymphatic: Aortic atherosclerosis. No aneurysm. No abdominopelvic adenopathy identified.  Reproductive: Prostate gland enlargement.  Other: No free fluid or fluid collections. Fat containing umbilical hernia noted.  Musculoskeletal: Multilevel degenerative disc disease identified within the  lumbar spine. Bilateral facet hypertrophy and degenerative change also noted at L3-4, L4-5 and L5-S1.  IMPRESSION: 1. No acute findings identified within the abdomen or pelvis. 2. Persistent diffuse bladder wall thickening with surrounding soft tissue stranding. Correlate for any clinical signs or symptoms of cystitis or chronic bladder outlet obstruction. 3. Large diverticulum posterior to the bladder is again noted. Within the dependent portion of this diverticulum there is a 6 mm calcification. 4. Prostate gland enlargement. 5. Fat containing umbilical hernia. 6. Coronary artery calcifications noted. 7. Lumbar spondylosis. 8. Aortic atherosclerosis.  Aortic Atherosclerosis (ICD10-I70.0).   Electronically Signed By: Kerby Moors M.D. On: 06/14/2020 09:57   Assessment & Plan:    1. Urinary retention -Continue monthly foley changes -RTC 1 year   No follow-ups on file.  Nicolette Bang, MD  [facility] [place]

## 2020-12-19 ENCOUNTER — Ambulatory Visit: Payer: Medicare HMO

## 2020-12-20 DIAGNOSIS — I10 Essential (primary) hypertension: Secondary | ICD-10-CM | POA: Diagnosis not present

## 2020-12-20 DIAGNOSIS — R82998 Other abnormal findings in urine: Secondary | ICD-10-CM | POA: Diagnosis not present

## 2020-12-20 DIAGNOSIS — D692 Other nonthrombocytopenic purpura: Secondary | ICD-10-CM | POA: Diagnosis not present

## 2020-12-20 DIAGNOSIS — Z960 Presence of urogenital implants: Secondary | ICD-10-CM | POA: Diagnosis not present

## 2020-12-20 DIAGNOSIS — Z7902 Long term (current) use of antithrombotics/antiplatelets: Secondary | ICD-10-CM | POA: Diagnosis not present

## 2021-01-03 ENCOUNTER — Ambulatory Visit: Payer: Medicare HMO

## 2021-01-04 DIAGNOSIS — I10 Essential (primary) hypertension: Secondary | ICD-10-CM | POA: Diagnosis not present

## 2021-01-04 DIAGNOSIS — E782 Mixed hyperlipidemia: Secondary | ICD-10-CM | POA: Diagnosis not present

## 2021-01-04 DIAGNOSIS — F331 Major depressive disorder, recurrent, moderate: Secondary | ICD-10-CM | POA: Diagnosis not present

## 2021-01-06 ENCOUNTER — Other Ambulatory Visit: Payer: Self-pay

## 2021-01-06 ENCOUNTER — Ambulatory Visit (INDEPENDENT_AMBULATORY_CARE_PROVIDER_SITE_OTHER): Payer: Medicare HMO

## 2021-01-06 ENCOUNTER — Telehealth: Payer: Self-pay

## 2021-01-06 DIAGNOSIS — N390 Urinary tract infection, site not specified: Secondary | ICD-10-CM | POA: Diagnosis not present

## 2021-01-06 DIAGNOSIS — R339 Retention of urine, unspecified: Secondary | ICD-10-CM

## 2021-01-06 LAB — MICROSCOPIC EXAMINATION
Epithelial Cells (non renal): >10 /hpf — AB (ref 0–10)
RBC: >30 /hpf — AB (ref 0–2)
Renal Epithel, UA: NONE SEEN /hpf
WBC, UA: >30 /hpf — AB (ref 0–5)

## 2021-01-06 LAB — URINALYSIS, ROUTINE W REFLEX MICROSCOPIC
Bilirubin, UA: NEGATIVE
Glucose, UA: NEGATIVE
Ketones, UA: NEGATIVE
Nitrite, UA: POSITIVE — AB
Specific Gravity, UA: 1.025 (ref 1.005–1.030)
Urobilinogen, Ur: 0.2 mg/dL (ref 0.2–1.0)
pH, UA: 7 (ref 5.0–7.5)

## 2021-01-06 NOTE — Progress Notes (Signed)
Cath Change/ Replacement  Patient is present today for a catheter change due to urinary retention.  12ml of water was removed from the balloon, a 16 coude FR foley cath was removed with out difficulty.  Patient was cleaned and prepped in a sterile fashion with betadine. A 16 coude FR foley cath was replaced into the bladder no complications were noted Urine return was noted 39ml and urine was cloudy and yellow in color with bad smell. The balloon was filled with 58ml of sterile water. A leg bag was attached for drainage. Patient was given proper instruction on catheter care.    Performed by: Antionette Char, Caytlin Better,LPN  Follow up: 1 month  A urine specimen was taken from new cath and sent for culture after pt complained of pain and burning upon urination.

## 2021-01-15 LAB — CULTURE, URINE COMPREHENSIVE

## 2021-01-17 ENCOUNTER — Ambulatory Visit: Payer: Medicare HMO

## 2021-01-20 DIAGNOSIS — E785 Hyperlipidemia, unspecified: Secondary | ICD-10-CM | POA: Diagnosis not present

## 2021-01-20 DIAGNOSIS — E039 Hypothyroidism, unspecified: Secondary | ICD-10-CM | POA: Diagnosis not present

## 2021-01-20 DIAGNOSIS — Z7902 Long term (current) use of antithrombotics/antiplatelets: Secondary | ICD-10-CM | POA: Diagnosis not present

## 2021-01-20 DIAGNOSIS — Z960 Presence of urogenital implants: Secondary | ICD-10-CM | POA: Diagnosis not present

## 2021-01-20 DIAGNOSIS — D692 Other nonthrombocytopenic purpura: Secondary | ICD-10-CM | POA: Diagnosis not present

## 2021-01-21 ENCOUNTER — Telehealth: Payer: Self-pay

## 2021-01-21 NOTE — Telephone Encounter (Signed)
-----   Message from Franchot Gallo, MD sent at 01/21/2021  8:49 AM EDT ----- Notify patient that a couple of bacteria grew out.  If he is still having some burning let me know and I can send in ----- Message ----- From: Iris Pert, LPN Sent: [id number]  11:18 AM EDT To: Franchot Gallo, MD  Please review

## 2021-01-23 NOTE — Telephone Encounter (Signed)
-----   Message from Franchot Gallo, MD sent at 01/21/2021  8:49 AM EDT ----- Notify patient that a couple of bacteria grew out.  If he is still having some burning let me know and I can send in ----- Message ----- From: Iris Pert, LPN Sent: [id number]  11:18 AM EDT To: Franchot Gallo, MD  Please review

## 2021-01-29 NOTE — Progress Notes (Signed)
Letter sent.

## 2021-01-30 ENCOUNTER — Ambulatory Visit: Payer: Medicare HMO

## 2021-02-03 DIAGNOSIS — F331 Major depressive disorder, recurrent, moderate: Secondary | ICD-10-CM | POA: Diagnosis not present

## 2021-02-03 DIAGNOSIS — G40909 Epilepsy, unspecified, not intractable, without status epilepticus: Secondary | ICD-10-CM | POA: Diagnosis not present

## 2021-02-03 DIAGNOSIS — E7849 Other hyperlipidemia: Secondary | ICD-10-CM | POA: Diagnosis not present

## 2021-02-03 DIAGNOSIS — I10 Essential (primary) hypertension: Secondary | ICD-10-CM | POA: Diagnosis not present

## 2021-02-03 DIAGNOSIS — K219 Gastro-esophageal reflux disease without esophagitis: Secondary | ICD-10-CM | POA: Diagnosis not present

## 2021-02-05 ENCOUNTER — Telehealth: Payer: Self-pay

## 2021-02-05 NOTE — Telephone Encounter (Signed)
I called patient. No answer. No way to leave message.

## 2021-02-05 NOTE — Telephone Encounter (Signed)
-----   Message from Franchot Gallo, MD sent at 01/21/2021  8:49 AM EDT ----- Notify patient that a couple of bacteria grew out.  If he is still having some burning let me know and I can send in ----- Message ----- From: Iris Pert, LPN Sent: [id number]  11:18 AM EDT To: Franchot Gallo, MD  Please review

## 2021-02-10 ENCOUNTER — Ambulatory Visit: Payer: Medicare HMO

## 2021-02-11 ENCOUNTER — Ambulatory Visit: Payer: Medicare HMO

## 2021-02-24 DIAGNOSIS — E039 Hypothyroidism, unspecified: Secondary | ICD-10-CM | POA: Diagnosis not present

## 2021-02-24 DIAGNOSIS — E782 Mixed hyperlipidemia: Secondary | ICD-10-CM | POA: Diagnosis not present

## 2021-02-24 DIAGNOSIS — E7849 Other hyperlipidemia: Secondary | ICD-10-CM | POA: Diagnosis not present

## 2021-02-24 DIAGNOSIS — I10 Essential (primary) hypertension: Secondary | ICD-10-CM | POA: Diagnosis not present

## 2021-02-24 DIAGNOSIS — R7301 Impaired fasting glucose: Secondary | ICD-10-CM | POA: Diagnosis not present

## 2021-02-28 ENCOUNTER — Ambulatory Visit (INDEPENDENT_AMBULATORY_CARE_PROVIDER_SITE_OTHER): Payer: Medicare HMO

## 2021-02-28 ENCOUNTER — Other Ambulatory Visit: Payer: Self-pay

## 2021-02-28 DIAGNOSIS — R339 Retention of urine, unspecified: Secondary | ICD-10-CM

## 2021-02-28 NOTE — Progress Notes (Signed)
Cath Change/ Replacement  Patient is present today for a catheter change due to urinary retention.  26ml of water was removed from the balloon, a 16 coudeFR foley cath was removed with out difficulty.  Patient was cleaned and prepped in a sterile fashion with betadine. A 16 coude FR foley cath was replaced into the bladder no complications were noted Urine return was noted 61ml and urine was cloudy in color. The balloon was filled with 19ml of sterile water. A leg  bag was attached for drainage.  A night bag was also given to the patient and patient was given instruction on how to change from one bag to another. Patient was given proper instruction on catheter care.    Performed by: Estill Bamberg RN  Follow up: 1 month NV

## 2021-03-03 ENCOUNTER — Ambulatory Visit: Payer: Medicare HMO

## 2021-03-04 DIAGNOSIS — G40309 Generalized idiopathic epilepsy and epileptic syndromes, not intractable, without status epilepticus: Secondary | ICD-10-CM | POA: Diagnosis not present

## 2021-03-04 DIAGNOSIS — D692 Other nonthrombocytopenic purpura: Secondary | ICD-10-CM | POA: Diagnosis not present

## 2021-03-04 DIAGNOSIS — F331 Major depressive disorder, recurrent, moderate: Secondary | ICD-10-CM | POA: Diagnosis not present

## 2021-03-04 DIAGNOSIS — G8114 Spastic hemiplegia affecting left nondominant side: Secondary | ICD-10-CM | POA: Diagnosis not present

## 2021-03-04 DIAGNOSIS — I10 Essential (primary) hypertension: Secondary | ICD-10-CM | POA: Diagnosis not present

## 2021-03-04 DIAGNOSIS — K5901 Slow transit constipation: Secondary | ICD-10-CM | POA: Diagnosis not present

## 2021-03-04 DIAGNOSIS — E7849 Other hyperlipidemia: Secondary | ICD-10-CM | POA: Diagnosis not present

## 2021-03-04 DIAGNOSIS — D500 Iron deficiency anemia secondary to blood loss (chronic): Secondary | ICD-10-CM | POA: Diagnosis not present

## 2021-03-06 DIAGNOSIS — G40909 Epilepsy, unspecified, not intractable, without status epilepticus: Secondary | ICD-10-CM | POA: Diagnosis not present

## 2021-03-06 DIAGNOSIS — K219 Gastro-esophageal reflux disease without esophagitis: Secondary | ICD-10-CM | POA: Diagnosis not present

## 2021-03-06 DIAGNOSIS — E7849 Other hyperlipidemia: Secondary | ICD-10-CM | POA: Diagnosis not present

## 2021-03-06 DIAGNOSIS — I10 Essential (primary) hypertension: Secondary | ICD-10-CM | POA: Diagnosis not present

## 2021-03-06 DIAGNOSIS — F331 Major depressive disorder, recurrent, moderate: Secondary | ICD-10-CM | POA: Diagnosis not present

## 2021-03-11 ENCOUNTER — Other Ambulatory Visit: Payer: Self-pay | Admitting: Urology

## 2021-03-11 DIAGNOSIS — N390 Urinary tract infection, site not specified: Secondary | ICD-10-CM

## 2021-03-11 MED ORDER — CIPROFLOXACIN HCL 250 MG PO TABS: 250.0000 mg | ORAL_TABLET | Freq: Two times a day (BID) | ORAL | 0 refills | Status: DC

## 2021-03-13 ENCOUNTER — Ambulatory Visit: Payer: Medicare HMO

## 2021-03-20 NOTE — Progress Notes (Signed)
Letter mailed

## 2021-03-31 ENCOUNTER — Ambulatory Visit: Payer: Medicare HMO

## 2021-04-03 ENCOUNTER — Other Ambulatory Visit: Payer: Self-pay

## 2021-04-03 ENCOUNTER — Ambulatory Visit (INDEPENDENT_AMBULATORY_CARE_PROVIDER_SITE_OTHER): Payer: Medicare HMO

## 2021-04-03 DIAGNOSIS — R339 Retention of urine, unspecified: Secondary | ICD-10-CM | POA: Diagnosis not present

## 2021-04-03 NOTE — Progress Notes (Signed)
Cath Change/ Replacement  Patient is present today for a catheter change due to urinary retention.  13m of water was removed from the balloon, a 16FR coude foley cath was removed without difficulty.  Patient was cleaned and prepped in a sterile fashion with betadine. A 16 FR coude foley cath was replaced into the bladder no complications were noted Urine return was noted 174mand urine was yellow in color. The balloon was filled with 1030mf sterile water. A leg bag was attached for drainage.  A night bag was also given to the patient and patient was given instruction on how to change from one bag to another. Patient was given proper instruction on catheter care.    Performed by: Oluwateniola Leitch LPN  Follow up: Keep next scheduled NV

## 2021-04-03 NOTE — Patient Instructions (Signed)
Foley Catheter Care and Patient Education  Perform catheter care every day.  You can do this while in the shower, but NOT while taking a tub bath.  You will need the following supplies: -mild soap, such as Dove -water -a clean washcloth (not one already used for bathing) or a 4x4 piece of gauze -1 Cath-Secure -night drainage bag -2 alcohol swabs  Was you hands thoroughly with soap and water Using mild soap and water, clan your genital area Men should retract the foreskin, if needed, and clean the area, including the penis Women should separate the labia, and clean the area from front to back  3. Clean your urinary opening, which is where the catheter enters your body. 4. Clean the catheter from where it enters your body and then down, away from your body.  Hold the catheter at the point it enters your body so that you don't put tension on it. 5. Rinse the area well and dry it gently.  Changing the drainage bag You will change your drainage bag twice a day -in the morning after you shower, change he night bag to the leg bag -at night before you go to bed, change the leg bag to the night bag  Wash your hands thoroughly with soap and water Empty the urine from the drainage bag into the toilet before you change it Pinch off the catheter with your fingers and disconnect the used bag Wipe the end of the catheter using an alcohol pad Wipe the connector on the bag using the second alcohol pad Connect the clean bag to the catheter and release your finger pinch Check all connections.  Straighten any kinks or twits in the tubing  Caring for the Leg bag -always wear the leg bag below your knee.  This will help it drain. -keep the leg bag secure with the velcro straps.  If the straps leave a mark on your leg they are to tight and should be loosened.  Leaving the straps too tight can decrease you circulation and lead to blood clots. -empty the leg bag through the spout at the bottom every  2-4 hours as needed.  Do not let the bag become completely full. -do not lie down for longer than 2 hours while you are wearing the leg bag.  Caring for the Night Bag -always keep the night bag below the level of your bladder . -to hang your night bag while you sleep, place a clean plastic bag inside of a wastebasket.  Hang the night bag on the inside of the wastebasket.  Cleaning the Drainage bag Wash you hands thoroughly with soap and water. Rinse the equipment with cool water.  Do not use hot water it can damage the plastic equipment. Was the equipment with a mild liquid detergent (ivory) and rinse with cool water. To decrease odor, fill the bag halfway with a mixture of 1 part vinegar and 3 parts water. Shake the bag and let it sit for 15 minutes. Rinse the bag with cool water and hang it up to dry.  Preventing infection -keep the drainage bag below the level of your bladder and off the floor at all times. -keep the catheter secured to your thigh to prevent it from moving. -do not lie on or block the flow of urine in the tubing. -shower daily to keep the catheter clean. -clean your hands before and after touching the catheter or bag. -the spout of the drainage bag should never touch the side of   the toilet or any emptying container.  Special Points -You may see some blood or urine around where the catheter enters your body, especially when walking or having a bowel movement.  This is normal, as long as there is urine draining into the drainage bag.  If you experience significant leakage around  catheter tube where it enters your urethra possibly associated with lower abdominal cramping you could be having a bladder spasm.  Please notify your doctor and we can prescribe you a medication to improve your symptoms. -drink 1-2 glasses of liquids every 2 hours while you're awake.  Call your doctor immediately if -your catheter comes out, do not try to replace it yourself -you have  temperature of 101F (38.8C) or higher -you have decrease in the amount of urine you are making -you have foul-smelling urine -you have bright red blood or large blood clots in your urine -you have abdominal pain and no urine in your catheter bag   

## 2021-04-06 DIAGNOSIS — I10 Essential (primary) hypertension: Secondary | ICD-10-CM | POA: Diagnosis not present

## 2021-04-06 DIAGNOSIS — K219 Gastro-esophageal reflux disease without esophagitis: Secondary | ICD-10-CM | POA: Diagnosis not present

## 2021-04-06 DIAGNOSIS — G40909 Epilepsy, unspecified, not intractable, without status epilepticus: Secondary | ICD-10-CM | POA: Diagnosis not present

## 2021-04-06 DIAGNOSIS — E7849 Other hyperlipidemia: Secondary | ICD-10-CM | POA: Diagnosis not present

## 2021-04-06 DIAGNOSIS — F331 Major depressive disorder, recurrent, moderate: Secondary | ICD-10-CM | POA: Diagnosis not present

## 2021-04-07 ENCOUNTER — Ambulatory Visit: Payer: Medicare HMO

## 2021-04-21 DIAGNOSIS — F330 Major depressive disorder, recurrent, mild: Secondary | ICD-10-CM | POA: Diagnosis not present

## 2021-04-21 DIAGNOSIS — Z960 Presence of urogenital implants: Secondary | ICD-10-CM | POA: Diagnosis not present

## 2021-04-21 DIAGNOSIS — I739 Peripheral vascular disease, unspecified: Secondary | ICD-10-CM | POA: Diagnosis not present

## 2021-04-28 ENCOUNTER — Ambulatory Visit: Payer: Medicare HMO

## 2021-05-05 ENCOUNTER — Ambulatory Visit: Payer: Medicare HMO

## 2021-05-06 ENCOUNTER — Ambulatory Visit (INDEPENDENT_AMBULATORY_CARE_PROVIDER_SITE_OTHER): Payer: Medicare HMO

## 2021-05-06 ENCOUNTER — Other Ambulatory Visit: Payer: Self-pay

## 2021-05-06 DIAGNOSIS — R339 Retention of urine, unspecified: Secondary | ICD-10-CM | POA: Diagnosis not present

## 2021-05-06 NOTE — Progress Notes (Signed)
Cath Change/ Replacement  Patient is present today for a catheter change due to urinary retention.  59m of water was removed from the balloon, a 16 coude FR foley cath was removed with out difficulty.  Patient was cleaned and prepped in a sterile fashion with betadine. A 16 coude FR foley cath was replaced into the bladder no complications were noted Urine return was noted 326mand urine was yellow in color. The balloon was filled with 1052mf sterile water. A leg bag was attached for drainage.  A night bag was also given to the patient and patient was given instruction on how to change from one bag to another. Patient was given proper instruction on catheter care.    Performed by: Kalob Bergen LPN  Follow up:  Keep next scheduled NV

## 2021-05-06 NOTE — Patient Instructions (Signed)
Foley Catheter Care and Patient Education  Perform catheter care every day.  You can do this while in the shower, but NOT while taking a tub bath.  You will need the following supplies: -mild soap, such as Dove -water -a clean washcloth (not one already used for bathing) or a 4x4 piece of gauze -1 Cath-Secure -night drainage bag -2 alcohol swabs  Was you hands thoroughly with soap and water Using mild soap and water, clan your genital area Men should retract the foreskin, if needed, and clean the area, including the penis Women should separate the labia, and clean the area from front to back  3. Clean your urinary opening, which is where the catheter enters your body. 4. Clean the catheter from where it enters your body and then down, away from your body.  Hold the catheter at the point it enters your body so that you don't put tension on it. 5. Rinse the area well and dry it gently.  Changing the drainage bag You will change your drainage bag twice a day -in the morning after you shower, change he night bag to the leg bag -at night before you go to bed, change the leg bag to the night bag  Wash your hands thoroughly with soap and water Empty the urine from the drainage bag into the toilet before you change it Pinch off the catheter with your fingers and disconnect the used bag Wipe the end of the catheter using an alcohol pad Wipe the connector on the bag using the second alcohol pad Connect the clean bag to the catheter and release your finger pinch Check all connections.  Straighten any kinks or twits in the tubing  Caring for the Leg bag -always wear the leg bag below your knee.  This will help it drain. -keep the leg bag secure with the velcro straps.  If the straps leave a mark on your leg they are to tight and should be loosened.  Leaving the straps too tight can decrease you circulation and lead to blood clots. -empty the leg bag through the spout at the bottom every  2-4 hours as needed.  Do not let the bag become completely full. -do not lie down for longer than 2 hours while you are wearing the leg bag.  Caring for the Night Bag -always keep the night bag below the level of your bladder . -to hang your night bag while you sleep, place a clean plastic bag inside of a wastebasket.  Hang the night bag on the inside of the wastebasket.  Cleaning the Drainage bag Wash you hands thoroughly with soap and water. Rinse the equipment with cool water.  Do not use hot water it can damage the plastic equipment. Was the equipment with a mild liquid detergent (ivory) and rinse with cool water. To decrease odor, fill the bag halfway with a mixture of 1 part vinegar and 3 parts water. Shake the bag and let it sit for 15 minutes. Rinse the bag with cool water and hang it up to dry.  Preventing infection -keep the drainage bag below the level of your bladder and off the floor at all times. -keep the catheter secured to your thigh to prevent it from moving. -do not lie on or block the flow of urine in the tubing. -shower daily to keep the catheter clean. -clean your hands before and after touching the catheter or bag. -the spout of the drainage bag should never touch the side of   the toilet or any emptying container.  Special Points -You may see some blood or urine around where the catheter enters your body, especially when walking or having a bowel movement.  This is normal, as long as there is urine draining into the drainage bag.  If you experience significant leakage around  catheter tube where it enters your urethra possibly associated with lower abdominal cramping you could be having a bladder spasm.  Please notify your doctor and we can prescribe you a medication to improve your symptoms. -drink 1-2 glasses of liquids every 2 hours while you're awake.  Call your doctor immediately if -your catheter comes out, do not try to replace it yourself -you have  temperature of 101F (38.8C) or higher -you have decrease in the amount of urine you are making -you have foul-smelling urine -you have bright red blood or large blood clots in your urine -you have abdominal pain and no urine in your catheter bag   

## 2021-05-07 DIAGNOSIS — F331 Major depressive disorder, recurrent, moderate: Secondary | ICD-10-CM | POA: Diagnosis not present

## 2021-05-07 DIAGNOSIS — G40909 Epilepsy, unspecified, not intractable, without status epilepticus: Secondary | ICD-10-CM | POA: Diagnosis not present

## 2021-05-07 DIAGNOSIS — K219 Gastro-esophageal reflux disease without esophagitis: Secondary | ICD-10-CM | POA: Diagnosis not present

## 2021-05-07 DIAGNOSIS — E7849 Other hyperlipidemia: Secondary | ICD-10-CM | POA: Diagnosis not present

## 2021-05-07 DIAGNOSIS — I10 Essential (primary) hypertension: Secondary | ICD-10-CM | POA: Diagnosis not present

## 2021-05-26 ENCOUNTER — Ambulatory Visit: Payer: Medicare HMO

## 2021-05-31 DIAGNOSIS — Z23 Encounter for immunization: Secondary | ICD-10-CM | POA: Diagnosis not present

## 2021-06-02 ENCOUNTER — Ambulatory Visit (INDEPENDENT_AMBULATORY_CARE_PROVIDER_SITE_OTHER): Payer: Medicare HMO

## 2021-06-02 ENCOUNTER — Other Ambulatory Visit: Payer: Self-pay

## 2021-06-02 DIAGNOSIS — R339 Retention of urine, unspecified: Secondary | ICD-10-CM | POA: Diagnosis not present

## 2021-06-02 NOTE — Progress Notes (Signed)
Cath Change/ Replacement  Patient is present today for a catheter change due to urinary retention.  35ml of water was removed from the balloon, a 16 coude FR foley cath was removed with out difficulty.  Patient was cleaned and prepped in a sterile fashion with betadine. A 16 coude FR foley cath was replaced into the bladder no complications were noted Urine return was noted 99ml and urine was yellow in color. The balloon was filled with 55ml of sterile water. A leg bag was attached for drainage.  A night bag was also given to the patient and patient was given instruction on how to change from one bag to another. Patient was given proper instruction on catheter care.    Performed by: Lurline Hare  Follow up: 1 month NV cath change

## 2021-06-06 DIAGNOSIS — G40909 Epilepsy, unspecified, not intractable, without status epilepticus: Secondary | ICD-10-CM | POA: Diagnosis not present

## 2021-06-06 DIAGNOSIS — I10 Essential (primary) hypertension: Secondary | ICD-10-CM | POA: Diagnosis not present

## 2021-06-06 DIAGNOSIS — E7849 Other hyperlipidemia: Secondary | ICD-10-CM | POA: Diagnosis not present

## 2021-06-06 DIAGNOSIS — F331 Major depressive disorder, recurrent, moderate: Secondary | ICD-10-CM | POA: Diagnosis not present

## 2021-06-06 DIAGNOSIS — K219 Gastro-esophageal reflux disease without esophagitis: Secondary | ICD-10-CM | POA: Diagnosis not present

## 2021-06-11 DIAGNOSIS — I509 Heart failure, unspecified: Secondary | ICD-10-CM | POA: Diagnosis not present

## 2021-06-11 DIAGNOSIS — I110 Hypertensive heart disease with heart failure: Secondary | ICD-10-CM | POA: Diagnosis not present

## 2021-06-17 DIAGNOSIS — E7849 Other hyperlipidemia: Secondary | ICD-10-CM | POA: Diagnosis not present

## 2021-06-17 DIAGNOSIS — I10 Essential (primary) hypertension: Secondary | ICD-10-CM | POA: Diagnosis not present

## 2021-06-17 DIAGNOSIS — K219 Gastro-esophageal reflux disease without esophagitis: Secondary | ICD-10-CM | POA: Diagnosis not present

## 2021-06-17 DIAGNOSIS — E039 Hypothyroidism, unspecified: Secondary | ICD-10-CM | POA: Diagnosis not present

## 2021-06-17 DIAGNOSIS — E782 Mixed hyperlipidemia: Secondary | ICD-10-CM | POA: Diagnosis not present

## 2021-06-26 DIAGNOSIS — G40309 Generalized idiopathic epilepsy and epileptic syndromes, not intractable, without status epilepticus: Secondary | ICD-10-CM | POA: Diagnosis not present

## 2021-06-26 DIAGNOSIS — G8114 Spastic hemiplegia affecting left nondominant side: Secondary | ICD-10-CM | POA: Diagnosis not present

## 2021-06-26 DIAGNOSIS — I10 Essential (primary) hypertension: Secondary | ICD-10-CM | POA: Diagnosis not present

## 2021-06-26 DIAGNOSIS — D500 Iron deficiency anemia secondary to blood loss (chronic): Secondary | ICD-10-CM | POA: Diagnosis not present

## 2021-06-26 DIAGNOSIS — D692 Other nonthrombocytopenic purpura: Secondary | ICD-10-CM | POA: Diagnosis not present

## 2021-06-26 DIAGNOSIS — Z23 Encounter for immunization: Secondary | ICD-10-CM | POA: Diagnosis not present

## 2021-06-26 DIAGNOSIS — Z0001 Encounter for general adult medical examination with abnormal findings: Secondary | ICD-10-CM | POA: Diagnosis not present

## 2021-06-26 DIAGNOSIS — I700 Atherosclerosis of aorta: Secondary | ICD-10-CM | POA: Diagnosis not present

## 2021-06-27 DIAGNOSIS — I700 Atherosclerosis of aorta: Secondary | ICD-10-CM | POA: Diagnosis not present

## 2021-06-27 DIAGNOSIS — E7849 Other hyperlipidemia: Secondary | ICD-10-CM | POA: Diagnosis not present

## 2021-06-27 DIAGNOSIS — Z0001 Encounter for general adult medical examination with abnormal findings: Secondary | ICD-10-CM | POA: Diagnosis not present

## 2021-06-27 DIAGNOSIS — F331 Major depressive disorder, recurrent, moderate: Secondary | ICD-10-CM | POA: Diagnosis not present

## 2021-06-27 DIAGNOSIS — I200 Unstable angina: Secondary | ICD-10-CM | POA: Diagnosis not present

## 2021-06-27 DIAGNOSIS — D649 Anemia, unspecified: Secondary | ICD-10-CM | POA: Diagnosis not present

## 2021-06-27 DIAGNOSIS — E039 Hypothyroidism, unspecified: Secondary | ICD-10-CM | POA: Diagnosis not present

## 2021-06-27 DIAGNOSIS — I10 Essential (primary) hypertension: Secondary | ICD-10-CM | POA: Diagnosis not present

## 2021-07-01 ENCOUNTER — Other Ambulatory Visit: Payer: Self-pay

## 2021-07-01 ENCOUNTER — Ambulatory Visit (INDEPENDENT_AMBULATORY_CARE_PROVIDER_SITE_OTHER): Payer: Medicare HMO

## 2021-07-01 DIAGNOSIS — R339 Retention of urine, unspecified: Secondary | ICD-10-CM

## 2021-07-01 NOTE — Progress Notes (Signed)
Cath Change/ Replacement  Patient is present today for a catheter change due to urinary retention.  85ml of water was removed from the balloon, a 16 coudeFR foley cath was removed with out difficulty.  Patient was cleaned and prepped in a sterile fashion with betadine. A 16 coude FR foley cath was replaced into the bladder no complications were noted Urine return was noted 149ml and urine was yellow in color. The balloon was filled with 14ml of sterile water. A leg bag was attached for drainage.  A night bag was also given to the patient and patient was given instruction on how to change from one bag to another. Patient was given proper instruction on catheter care.    Performed by: Kasidee Voisin LPN  Follow up: Keep scheduled NV

## 2021-07-01 NOTE — Patient Instructions (Signed)

## 2021-07-07 DIAGNOSIS — F331 Major depressive disorder, recurrent, moderate: Secondary | ICD-10-CM | POA: Diagnosis not present

## 2021-07-07 DIAGNOSIS — E7849 Other hyperlipidemia: Secondary | ICD-10-CM | POA: Diagnosis not present

## 2021-07-07 DIAGNOSIS — I10 Essential (primary) hypertension: Secondary | ICD-10-CM | POA: Diagnosis not present

## 2021-07-07 DIAGNOSIS — K219 Gastro-esophageal reflux disease without esophagitis: Secondary | ICD-10-CM | POA: Diagnosis not present

## 2021-07-07 DIAGNOSIS — G40909 Epilepsy, unspecified, not intractable, without status epilepticus: Secondary | ICD-10-CM | POA: Diagnosis not present

## 2021-07-28 ENCOUNTER — Ambulatory Visit: Payer: Medicare HMO

## 2021-08-06 DIAGNOSIS — E7849 Other hyperlipidemia: Secondary | ICD-10-CM | POA: Diagnosis not present

## 2021-08-06 DIAGNOSIS — K219 Gastro-esophageal reflux disease without esophagitis: Secondary | ICD-10-CM | POA: Diagnosis not present

## 2021-08-06 DIAGNOSIS — I10 Essential (primary) hypertension: Secondary | ICD-10-CM | POA: Diagnosis not present

## 2021-08-06 DIAGNOSIS — G40909 Epilepsy, unspecified, not intractable, without status epilepticus: Secondary | ICD-10-CM | POA: Diagnosis not present

## 2021-08-06 DIAGNOSIS — F331 Major depressive disorder, recurrent, moderate: Secondary | ICD-10-CM | POA: Diagnosis not present

## 2021-08-14 ENCOUNTER — Other Ambulatory Visit: Payer: Self-pay

## 2021-08-14 ENCOUNTER — Ambulatory Visit (INDEPENDENT_AMBULATORY_CARE_PROVIDER_SITE_OTHER): Payer: Medicare HMO

## 2021-08-14 ENCOUNTER — Ambulatory Visit: Payer: Medicare HMO

## 2021-08-14 DIAGNOSIS — R339 Retention of urine, unspecified: Secondary | ICD-10-CM | POA: Diagnosis not present

## 2021-08-14 NOTE — Patient Instructions (Signed)

## 2021-08-14 NOTE — Progress Notes (Signed)
Cath Change/ Replacement  Patient is present today for a catheter change due to urinary retention.  22ml of water was removed from the balloon, a 18 FR coude foley cath was removed without difficulty.  Patient was cleaned and prepped in a sterile fashion with betadine. A 16 FR coude foley cath was replaced into the bladder no complications were noted Urine return was noted 10ml and urine was yellow in color. The balloon was filled with 85ml of sterile water. A leg bag was attached for drainage.  A night bag was also given to the patient and patient was given instruction on how to change from one bag to another. Patient was given proper instruction on catheter care.    Performed by: Kostas Marrow LPN  Follow up: keep next scheduled NV

## 2021-08-26 ENCOUNTER — Ambulatory Visit: Payer: Medicare HMO

## 2021-09-05 DIAGNOSIS — K219 Gastro-esophageal reflux disease without esophagitis: Secondary | ICD-10-CM | POA: Diagnosis not present

## 2021-09-05 DIAGNOSIS — E7849 Other hyperlipidemia: Secondary | ICD-10-CM | POA: Diagnosis not present

## 2021-09-05 DIAGNOSIS — G40909 Epilepsy, unspecified, not intractable, without status epilepticus: Secondary | ICD-10-CM | POA: Diagnosis not present

## 2021-09-05 DIAGNOSIS — I10 Essential (primary) hypertension: Secondary | ICD-10-CM | POA: Diagnosis not present

## 2021-09-05 DIAGNOSIS — F331 Major depressive disorder, recurrent, moderate: Secondary | ICD-10-CM | POA: Diagnosis not present

## 2021-09-11 ENCOUNTER — Ambulatory Visit: Payer: Medicare HMO

## 2021-10-05 DIAGNOSIS — E7849 Other hyperlipidemia: Secondary | ICD-10-CM | POA: Diagnosis not present

## 2021-10-05 DIAGNOSIS — I10 Essential (primary) hypertension: Secondary | ICD-10-CM | POA: Diagnosis not present

## 2021-10-29 ENCOUNTER — Ambulatory Visit (INDEPENDENT_AMBULATORY_CARE_PROVIDER_SITE_OTHER): Payer: Medicare HMO

## 2021-10-29 ENCOUNTER — Other Ambulatory Visit: Payer: Self-pay

## 2021-10-29 DIAGNOSIS — R339 Retention of urine, unspecified: Secondary | ICD-10-CM

## 2021-10-29 NOTE — Progress Notes (Signed)
Cath Change/ Replacement  Patient is present today for a catheter change due to urinary retention.  42ml of water was removed from the balloon, a 16FR coude  foley cath was removed with out difficulty.  Patient was cleaned and prepped in a sterile fashion with betadine. A 16 FR coude foley cath was replaced into the bladder no complications were noted Urine return was noted 30ml and urine was yellow in color. The balloon was filled with 20ml of sterile water. A leg bag was attached for drainage.  A night bag was also given to the patient and patient was given instruction on how to change from one bag to another. Patient was given proper instruction on catheter care.    Performed by: Estill Bamberg RN  Follow up: 1 month cath change

## 2021-11-04 DIAGNOSIS — E7849 Other hyperlipidemia: Secondary | ICD-10-CM | POA: Diagnosis not present

## 2021-11-04 DIAGNOSIS — I10 Essential (primary) hypertension: Secondary | ICD-10-CM | POA: Diagnosis not present

## 2021-12-02 ENCOUNTER — Other Ambulatory Visit: Payer: Self-pay

## 2021-12-02 ENCOUNTER — Ambulatory Visit (INDEPENDENT_AMBULATORY_CARE_PROVIDER_SITE_OTHER): Payer: Medicare HMO

## 2021-12-02 DIAGNOSIS — R339 Retention of urine, unspecified: Secondary | ICD-10-CM

## 2021-12-02 NOTE — Progress Notes (Signed)
Cath Change/ Replacement ? ?Patient is present today for a catheter change due to urinary retention.  61m of water was removed from the balloon, a 16FR coude foley cath was removed with out difficulty.  Patient was cleaned and prepped in a sterile fashion with betadine. A 16 FR coude foley cath was replaced into the bladder no complications were noted Urine return was noted 1063mand urine was yellow and cloudy in color. The balloon was filled with 1072mf sterile water. A leg bag was attached for drainage.  Patient was given proper instruction on catheter care.   ? ?Performed by: Elma Limas LPN ? ?Follow up: 1 month NV  ? ?Urine sent for culture due to foul smell ?

## 2021-12-02 NOTE — Patient Instructions (Signed)

## 2021-12-06 LAB — URINE CULTURE

## 2021-12-09 ENCOUNTER — Ambulatory Visit: Payer: Medicare HMO | Admitting: Urology

## 2021-12-11 ENCOUNTER — Other Ambulatory Visit: Payer: Self-pay | Admitting: Physician Assistant

## 2021-12-11 ENCOUNTER — Telehealth: Payer: Self-pay

## 2021-12-11 DIAGNOSIS — N390 Urinary tract infection, site not specified: Secondary | ICD-10-CM

## 2021-12-11 MED ORDER — SULFAMETHOXAZOLE-TRIMETHOPRIM 800-160 MG PO TABS: 1.0000 | ORAL_TABLET | Freq: Two times a day (BID) | ORAL | 0 refills | Status: DC

## 2021-12-11 NOTE — Telephone Encounter (Signed)
Tried to call pt- no answer, no voicemail

## 2021-12-11 NOTE — Telephone Encounter (Signed)
-----   Message from Reynaldo Minium, [place] sent at 12/11/2021  4:09 PM EDT ----- ?Please let the patient know that his urine culture is positive and needs antibiotic treatment.  I have sent Rx to his pharmacy ?----- Message ----- ?From: Cleon Gustin, MD ?Sent: 12/11/2021   2:46 PM EDT ?To: Berneice Heinrich Summerlin, PA-C ? ?Yes treat him ? ?----- Message ----- ?From: Summerlin, Berneice Heinrich, PA-C ?Sent: 12/11/2021   9:11 AM EDT ?To: Cleon Gustin, MD, # ? ?Mr Fung came in last week for cath change and urine was cx due to odor. Pt reportedly asymptomatic otherwise. He grew >100K Proteus Mirab. ?Past cxs have been + for Enterococcus and Citrobactor. Last imaging studies 2021. Last visit with you was 12/06/20. ?Do you want me to treat him?  ?----- Message ----- ?From: Interface, [facility] Lab Results In ?Sent: 12/06/2021   3:35 PM EDT ?To: Berneice Heinrich Summer[place], PA-C ? ? ? ? ?

## 2021-12-11 NOTE — Progress Notes (Signed)
Urine culture positive for Proteus mirabilis, sensitive to Bactrim DS.  Prescription sent to patient's pharmacy and patient informed. ?

## 2021-12-12 ENCOUNTER — Telehealth: Payer: Self-pay

## 2021-12-12 ENCOUNTER — Other Ambulatory Visit: Payer: Self-pay | Admitting: Physician Assistant

## 2021-12-12 DIAGNOSIS — R109 Unspecified abdominal pain: Secondary | ICD-10-CM

## 2021-12-12 DIAGNOSIS — N390 Urinary tract infection, site not specified: Secondary | ICD-10-CM

## 2021-12-12 DIAGNOSIS — R339 Retention of urine, unspecified: Secondary | ICD-10-CM

## 2021-12-12 NOTE — Telephone Encounter (Signed)
Unable to reach pt or granddaughter.  Left multiple voicemail's, letter sent informing patient.  ?

## 2021-12-12 NOTE — Telephone Encounter (Signed)
Unable to reach patient or granddaughter.  Left several voicemail's, letter sent. ?

## 2021-12-12 NOTE — Telephone Encounter (Signed)
-----   Message from Reynaldo Minium, [place] sent at 12/12/2021  9:11 AM EDT ----- ?Please inform this patient that the scheduling team will be calling to schedule a CT scan at [facility].  This is based on his previous urine culture results.  We need to rule out stone disease as the culprit for the bacteria that he grew. ? ?

## 2021-12-12 NOTE — Progress Notes (Signed)
Due to positive urine culture growing Proteus mirabilis, catheter related, CT stone study ordered per Dr. Alyson Ingles to rule out stone disease. ?

## 2021-12-15 DIAGNOSIS — E782 Mixed hyperlipidemia: Secondary | ICD-10-CM | POA: Diagnosis not present

## 2021-12-15 DIAGNOSIS — Z13220 Encounter for screening for lipoid disorders: Secondary | ICD-10-CM | POA: Diagnosis not present

## 2021-12-15 DIAGNOSIS — D649 Anemia, unspecified: Secondary | ICD-10-CM | POA: Diagnosis not present

## 2021-12-15 DIAGNOSIS — I10 Essential (primary) hypertension: Secondary | ICD-10-CM | POA: Diagnosis not present

## 2021-12-15 DIAGNOSIS — E7849 Other hyperlipidemia: Secondary | ICD-10-CM | POA: Diagnosis not present

## 2021-12-15 DIAGNOSIS — K219 Gastro-esophageal reflux disease without esophagitis: Secondary | ICD-10-CM | POA: Diagnosis not present

## 2021-12-15 DIAGNOSIS — D519 Vitamin B12 deficiency anemia, unspecified: Secondary | ICD-10-CM | POA: Diagnosis not present

## 2021-12-15 DIAGNOSIS — D529 Folate deficiency anemia, unspecified: Secondary | ICD-10-CM | POA: Diagnosis not present

## 2021-12-23 ENCOUNTER — Encounter: Payer: Self-pay | Admitting: Urology

## 2021-12-23 ENCOUNTER — Ambulatory Visit (INDEPENDENT_AMBULATORY_CARE_PROVIDER_SITE_OTHER): Payer: Medicare HMO | Admitting: Urology

## 2021-12-23 VITALS — BP 108/55 | HR 57

## 2021-12-23 DIAGNOSIS — E7849 Other hyperlipidemia: Secondary | ICD-10-CM | POA: Diagnosis not present

## 2021-12-23 DIAGNOSIS — I700 Atherosclerosis of aorta: Secondary | ICD-10-CM | POA: Diagnosis not present

## 2021-12-23 DIAGNOSIS — Z466 Encounter for fitting and adjustment of urinary device: Secondary | ICD-10-CM

## 2021-12-23 DIAGNOSIS — Z87440 Personal history of urinary (tract) infections: Secondary | ICD-10-CM | POA: Diagnosis not present

## 2021-12-23 DIAGNOSIS — R339 Retention of urine, unspecified: Secondary | ICD-10-CM

## 2021-12-23 DIAGNOSIS — K5901 Slow transit constipation: Secondary | ICD-10-CM | POA: Diagnosis not present

## 2021-12-23 DIAGNOSIS — G8114 Spastic hemiplegia affecting left nondominant side: Secondary | ICD-10-CM | POA: Diagnosis not present

## 2021-12-23 DIAGNOSIS — J449 Chronic obstructive pulmonary disease, unspecified: Secondary | ICD-10-CM | POA: Diagnosis not present

## 2021-12-23 DIAGNOSIS — I10 Essential (primary) hypertension: Secondary | ICD-10-CM | POA: Diagnosis not present

## 2021-12-23 DIAGNOSIS — R3914 Feeling of incomplete bladder emptying: Secondary | ICD-10-CM | POA: Diagnosis not present

## 2021-12-23 DIAGNOSIS — G40309 Generalized idiopathic epilepsy and epileptic syndromes, not intractable, without status epilepticus: Secondary | ICD-10-CM | POA: Diagnosis not present

## 2021-12-23 DIAGNOSIS — N401 Enlarged prostate with lower urinary tract symptoms: Secondary | ICD-10-CM | POA: Diagnosis not present

## 2021-12-23 DIAGNOSIS — D500 Iron deficiency anemia secondary to blood loss (chronic): Secondary | ICD-10-CM | POA: Diagnosis not present

## 2021-12-23 DIAGNOSIS — D692 Other nonthrombocytopenic purpura: Secondary | ICD-10-CM | POA: Diagnosis not present

## 2021-12-23 NOTE — Progress Notes (Signed)
? ?History of Present Illness: History of BPH with urinary retention.  He has had several procedures for his bladder outlet/prostate, and despite these he still has poor emptying.  He has been catheter dependent for some time. ? ?Most recent urine culture grew Proteus.  He has a pending CT urogram. ? ?He denies any issues with his chronic catheter placement. ? ?His wife died 6 days ago. ? ?Past Medical History:  ?Diagnosis Date  ? Bowel habit changes 03/21/2011  ? Chronic constipation 08  ? COPD (chronic obstructive pulmonary disease) ([place])   ? Depression   ? Enlarged prostate   ? Foley catheter in place 03-17-13  ? GERD (gastroesophageal reflux disease)   ? Hearing loss of both ears   ? Hepatitis   ? 20 yrs ago, states he turned yellow   ? Hx of colonic polyps   ? Mixed hyperlipidemia   ? Polymyalgia rheumatica ([place])   ? Seizures ([place])   ? caused by UTI-none recent- over  2 yr  ? Stroke [facility])   ? CVA- 2013, left sided weakness, mild residual  ? Subarachnoid hemorrhage ([place])   ? Unspecified essential hypertension   ? ? ?Past Surgical History:  ?Procedure Laterality Date  ? BIOPSY  07/08/2017  ? Procedure: BIOPSY;  Surgeon: Daneil Dolin, MD;  Location: [facility];  Service: Endoscopy;;  gastric  ? CATARACT EXTRACTION, BILATERAL    ? CHOLECYSTECTOMY    ? lap. gallbladder removal  ? COLONOSCOPY  07/04/07  ? ESOPHAGOGASTRODUODENOSCOPY (EGD) WITH PROPOFOL N/A 07/08/2017  ? Procedure: ESOPHAGOGASTRODUODENOSCOPY (EGD) WITH PROPOFOL;  Surgeon: Daneil Dolin, MD;  Location: [facility];  Service: Endoscopy;  Laterality: N/A;  7:30am  ? MALONEY DILATION N/A 07/08/2017  ? Procedure: MALONEY DILATION;  Surgeon: Daneil Dolin, MD;  Location: [facility];  Service: Endoscopy;  Laterality: N/A;  ? TONSILLECTOMY    ? TRANSURETHRAL INCISION OF BLADDER NECK N/A 01/24/2015  ? Procedure: TRANSURETHRAL INCISION OF BLADDER NECK;  Surgeon: Franchot Gallo, MD;  Location: [facility];  Service: Urology;  Laterality: N/A;   **TRANSURETHRAL RESECTION BLADDER NECK CONTRACTURE** ? ? ?  ? TRANSURETHRAL RESECTION OF PROSTATE N/A 03/23/2013  ? Procedure: TRANSURETHRAL RESECTION OF THE PROSTATE (TURP);  Surgeon: Franchot Gallo, MD;  Location: [facility];  Service: Urology;  Laterality: N/A;  ? ? ?Home Medications:  ?Allergies as of 12/23/2021   ?No Known Allergies ?  ? ?  ?Medication List  ?  ? ?  ? Accurate as of December 23, 2021  8:37 AM. If you have any questions, ask your nurse or doctor.  ?  ?  ? ?  ? ?buPROPion 150 MG 24 hr tablet ?Commonly known as: WELLBUTRIN XL ?  ?clonazePAM 0.5 MG tablet ?Commonly known as: KLONOPIN ?Take 0.5 mg by mouth at bedtime. ?  ?clopidogrel 75 MG tablet ?Commonly known as: PLAVIX ?Take 75 mg by mouth daily. ?  ?escitalopram 20 MG tablet ?Commonly known as: LEXAPRO ?Take 20 mg by mouth daily. ?  ?furosemide 20 MG tablet ?Commonly known as: LASIX ?Take 20 mg by mouth every morning. ?  ?liothyronine 25 MCG tablet ?Commonly known as: CYTOMEL ?Take 25 mcg by mouth daily. ?  ?lisinopril 20 MG tablet ?Commonly known as: ZESTRIL ?Take 20 mg by mouth every morning. ?  ?metoprolol succinate 50 MG 24 hr tablet ?Commonly known as: TOPROL-XL ?Take 50 mg by mouth 2 (two) times daily. Take with or immediately following a meal. ?  ?pantoprazole 40 MG tablet ?  Commonly known as: PROTONIX ?TAKE 1 TABLET BY MOUTH ONCE DAILY. ?  ?phenytoin 100 MG ER capsule ?Commonly known as: DILANTIN ?Take 200 mg by mouth 2 (two) times daily. ?  ?ranitidine 300 MG tablet ?Commonly known as: ZANTAC ?Take 300 mg by mouth daily. ?  ?sertraline 100 MG tablet ?Commonly known as: ZOLOFT ?  ?simvastatin 40 MG tablet ?Commonly known as: ZOCOR ?Take 40 mg by mouth daily. ?  ?sulfamethoxazole-trimethoprim 800-160 MG tablet ?Commonly known as: BACTRIM DS ?Take 1 tablet by mouth every 12 (twelve) hours. ?  ? ?  ? ? ?Allergies: No Known Allergies ? ?Family History  ?Problem Relation Age of Onset  ? Colon cancer Mother   ? Colon cancer Father   ? ? ?Social  History:  reports that he quit smoking about 54 years ago. His smoking use included cigarettes. He has a 45.00 pack-year smoking history. He has quit using smokeless tobacco. He reports that he does not drink alcohol and does not use drugs. ? ?ROS: ?A complete review of systems was performed.  All systems are negative except for pertinent findings as noted. ? ?Physical Exam:  ?Vital signs in last 24 hours: ?There were no vitals taken for this visit. ?Constitutional:  Alert and oriented, No acute distress ?Cardiovascular: Regular rate  ?Respiratory: Normal respiratory effort ?Neurologic: Grossly intact, no focal deficits ?Psychiatric: Normal mood and affect ? ?I have reviewed prior pt notes ? ?I have reviewed notes from previous physicians ? ?I have reviewed urinalysis results ? ? ? ?Impression/Assessment:  ?1.  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  He does well with chronic catheter ? ?2.  Recurrent urinary tract infections, most recently Proteus ? ?Plan:  ?1.  His catheter was changed today ? ?2.  CT has been ordered-we will call him with results.  Hopefully, he will have no evidence of significant urolithiasis ? ?3.  Office visit in 1 year ? ?

## 2021-12-23 NOTE — Patient Instructions (Signed)

## 2021-12-23 NOTE — Progress Notes (Signed)
Cath Change/ Replacement ? ?Patient is present today for a catheter change due to urinary retention.  72m of water was removed from the balloon, a 16 coude FR foley cath was removed with out difficulty.  Patient was cleaned and prepped in a sterile fashion with betadine. A 16 coude FR foley cath was replaced into the bladder no complications were noted Urine return was noted 218mand urine was yellow in color. The balloon was filled with 1097mf sterile water. A leg bag was attached for drainage.  A night bag was also given to the patient and patient was given instruction on how to change from one bag to another. Patient was given proper instruction on catheter care.   ? ?Performed by: AmaEstill Bamberg ? ?Follow up:  1 month catheter change  ?

## 2022-01-01 ENCOUNTER — Ambulatory Visit: Payer: Medicare HMO

## 2022-01-04 DIAGNOSIS — E7849 Other hyperlipidemia: Secondary | ICD-10-CM | POA: Diagnosis not present

## 2022-01-04 DIAGNOSIS — F331 Major depressive disorder, recurrent, moderate: Secondary | ICD-10-CM | POA: Diagnosis not present

## 2022-01-04 DIAGNOSIS — K219 Gastro-esophageal reflux disease without esophagitis: Secondary | ICD-10-CM | POA: Diagnosis not present

## 2022-01-04 DIAGNOSIS — G40909 Epilepsy, unspecified, not intractable, without status epilepticus: Secondary | ICD-10-CM | POA: Diagnosis not present

## 2022-01-04 DIAGNOSIS — I10 Essential (primary) hypertension: Secondary | ICD-10-CM | POA: Diagnosis not present

## 2022-01-20 ENCOUNTER — Ambulatory Visit (INDEPENDENT_AMBULATORY_CARE_PROVIDER_SITE_OTHER): Payer: Medicare HMO | Admitting: Physician Assistant

## 2022-01-20 DIAGNOSIS — R300 Dysuria: Secondary | ICD-10-CM

## 2022-01-20 DIAGNOSIS — R339 Retention of urine, unspecified: Secondary | ICD-10-CM

## 2022-01-20 DIAGNOSIS — N390 Urinary tract infection, site not specified: Secondary | ICD-10-CM

## 2022-01-20 DIAGNOSIS — J449 Chronic obstructive pulmonary disease, unspecified: Secondary | ICD-10-CM | POA: Diagnosis not present

## 2022-01-20 MED ORDER — AMOXICILLIN-POT CLAVULANATE 875-125 MG PO TABS: 1.0000 | ORAL_TABLET | Freq: Two times a day (BID) | ORAL | 0 refills | Status: DC

## 2022-01-20 NOTE — Progress Notes (Signed)
? ?Assessment: ?1. Burning with urination ?- Urinalysis, Routine w reflex microscopic ?- Urine Culture ? ?2. Urinary retention ? ?3. Urinary tract infection without hematuria, site unspecified ?- Urine Culture ?  ? ?Plan: ?Based on the patient's most recent cultures, Augmentin sent to his pharmacy for 7 days.  We will adjust treatment pending today's culture results.  Will follow-up with scheduling to inquire why the patient's CT urogram has not yet been ordered. Follow-up in 1 month for catheter change.  Strict ED precautions given.  Discussed, at length, with the patient that he does not need to allow his urine bags to get completely full before emptying them.  Also explained why the leg bag is not always at a position to allow gravity for drainage.  The patient is advised to use the gravity bag as often as possible and to change it when it is only halfway full.  If he insists on wearing the leg bag at all times, he will empty this more often as well.  He is advised to use the gravity bag at night regardless and is given new bags for changing. ? ?Chief Complaint: ?No chief complaint on file. ? ? ?HPI: ?Jimmy Franklin is a 88 y.o. male who presents for continued evaluation of chronic urinary retention and catheter change. Upon presentation, the pt c/o UTI sxs of burning around the catheter and change in urine appearance. Pt has h/o recent symptomatic UTIs.  He denies fever, chills, abdominal pain, nausea or vomiting.  He admits that he never started using the urine gravity bag.  He wears a leg bag at all times and states that he lets it get completely full before he changes it which is only twice daily.  Patient's most recent culture on 12/02/2021 was Proteus mirabilis, 100,000 colonies, mixed urogenital flora 25 K colonies, Proteus was resistant to nitrofurantoin and tetracycline. CT Urogram still pending. ? ?Urine dip= 3+ leukocytes, 3+ blood, nitrite negative. Only enough specimen for a dip, microscopic exam  cannot be performed as urine sent for culture ? ?12/23/21 ?History of BPH with urinary retention.  He has had several procedures for his bladder outlet/prostate, and despite these he still has poor emptying.  He has been catheter dependent for some time. ? ?Most recent urine culture grew Proteus.  He has a pending CT urogram. ? ?He denies any issues with his chronic catheter placement. ? ?Urine cx on 01/06/21 grew Citrobacter freundii and Enterococcus faecalis both greater than 100,000 colonies.  Resistance to penicillin, cefazolin, cefuroxime, intermediate resistance to Levaquin and Macrobid.  Also resistant to tetracycline and sulfa.  ? ?Portions of the above documentation were copied from a prior visit for review purposes only. ? ?Allergies: ?No Known Allergies ? ?PMH: ?Past Medical History:  ?Diagnosis Date  ? Bowel habit changes 03/21/2011  ? Chronic constipation 08  ? COPD (chronic obstructive pulmonary disease) ([place])   ? Depression   ? Enlarged prostate   ? Foley catheter in place 03-17-13  ? GERD (gastroesophageal reflux disease)   ? Hearing loss of both ears   ? Hepatitis   ? 20 yrs ago, states he turned yellow   ? Hx of colonic polyps   ? Mixed hyperlipidemia   ? Polymyalgia rheumatica ([place])   ? Seizures ([place])   ? caused by UTI-none recent- over  2 yr  ? Stroke [facility])   ? CVA- 2013, left sided weakness, mild residual  ? Subarachnoid hemorrhage ([place])   ? Unspecified essential hypertension   ? ? ?  PSH: ?Past Surgical History:  ?Procedure Laterality Date  ? BIOPSY  07/08/2017  ? Procedure: BIOPSY;  Surgeon: Daneil Dolin, MD;  [facility];  Service: Endoscopy;;  gastric  ? CATARACT EXTRACTION, BILATERAL    ? CHOLECYSTECTOMY    ? lap. gallbladder removal  ? COLONOSCOPY  07/04/07  ? ESOPHAGOGASTRODUODENOSCOPY (EGD) WITH PROPOFOL N/A 07/08/2017  ? Procedure: ESOPHAGOGASTRODUODENOSCOPY (EGD) WITH PROPOFOL;  Surgeon: Daneil Dolin, MD;  [facility];  Service: Endoscopy;  Laterality: N/A;   7:30am  ? MALONEY DILATION N/A 07/08/2017  ? Procedure: MALONEY DILATION;  Surgeon: Daneil Dolin, MD;  [facility];  Service: Endoscopy;  Laterality: N/A;  ? TONSILLECTOMY    ? TRANSURETHRAL INCISION OF BLADDER NECK N/A 01/24/2015  ? Procedure: TRANSURETHRAL INCISION OF BLADDER NECK;  Surgeon: Franchot Gallo, MD;  [facility]: [facility];  Service: Urology;  Laterality: N/A;  **TRANSURETHRAL RESECTION BLADDER NECK CONTRACTURE** ? ? ?  ? TRANSURETHRAL RESECTION OF PROSTATE N/A 03/23/2013  ? Procedure: TRANSURETHRAL RESECTION OF THE PROSTATE (TURP);  Surgeon: Franchot Gallo, MD;  [facility];  Service: Urology;  Laterality: N/A;  ? ? ?SH: ?Social History  ? ?Tobacco Use  ? Smoking status: Former  ?  Packs/day: 3.00  ?  Years: 15.00  ?  Pack years: 45.00  ?  Types: Cigarettes  ?  Quit date: 03/18/1967  ?  Years since quitting: 54.8  ? Smokeless tobacco: Former  ?Vaping Use  ? Vaping Use: Never used  ?Substance Use Topics  ? Alcohol use: No  ? Drug use: No  ? ? ?ROS: ?See HPI ? ?PE: ?There were no vitals taken for this visit. ?GENERAL APPEARANCE:  Well developed, well nourished, NAD ?HEENT:  Atraumatic, normocephalic ?NECK:  Supple. Trachea midline ?ABDOMEN:  Soft, non-tender, no masses ?EXTREMITIES:  Moves all extremities well ?NEUROLOGIC:  Alert and oriented x 3 ?MENTAL STATUS:  appropriate ?BACK:  Non-tender to palpation, No CVAT ?SKIN:  Warm, dry, and intact ? ? ?Results: ?Laboratory Data: ?Lab Results  ?Component Value Date  ? WBC 10.2 06/12/2017  ? HGB 13.7 06/12/2017  ? HCT 41.3 06/12/2017  ? MCV 84.5 06/12/2017  ? PLT 121 (L) 06/12/2017  ? ? ?Lab Results  ?Component Value Date  ? CREATININE 0.75 06/12/2017  ? ? ? ?Urinalysis ?   ?Component Value Date/Time  ? [place] 05/17/2019 1210  ? APPEARANCEUR Cloudy (A) 01/06/2021 1320  ? LABSPEC 1.015 05/17/2019 1210  ? PHURINE 7.0 05/17/2019 1210  ? GLUCOSEU Negative 01/06/2021 1320  ? HGBUR MODERATE (A) 05/17/2019 1210  ? BILIRUBINUR Negative  01/06/2021 1320  ? KETONESUR 5 (A) 05/17/2019 1210  ? PROTEINUR 3+ (A) 01/06/2021 1320  ? PROTEINUR 100 (A) 05/17/2019 1210  ? UROBILINOGEN 1.0 02/14/2012 0427  ? NITRITE Positive (A) 01/06/2021 1320  ? NITRITE NEGATIVE 05/17/2019 1210  ? LEUKOCYTESUR 3+ (A) 01/06/2021 1320  ? LEUKOCYTESUR LARGE (A) 05/17/2019 1210  ? ? ?Lab Results  ?Component Value Date  ? LABMICR See below: 01/06/2021  ? WBCUA >30 (A) 01/06/2021  ? LABEPIT >10 (A) 01/06/2021  ? MUCUS Present 01/06/2021  ? BACTERIA Many (A) 01/06/2021  ? ? ?Pertinent Imaging: ? ?No results found for this or any previous visit. ? ?No results found for this or any previous visit. ? ?No results found for this or any previous visit. ? ?No results found for this or any previous visit. ? ?Results for orders placed during the hospital encounter of 07/06/16 ? ?US Renal ? ?  Narrative ?CLINICAL DATA:  History of atonic bladder ? ?EXAM: ?RENAL / URINARY TRACT ULTRASOUND COMPLETE ? ?COMPARISON:  Abdominal and pelvic CT scan of  January 02, 2015 ? ?FINDINGS: ?Right Kidney: ? ?Length: 12.7 cm. The renal cortical echotexture is mildly increased ?and is approximately equal to that of the adjacent liver. There is a ?cystic structure with increased echogenicity of the far wall ?measuring 1.7 x 1.3 x 1.4 cm. It lies in the midpole. There is no ?hydronephrosis. ? ?Left Kidney: ? ?Length: 13.8 cm. The renal cortical echotexture is mildly increased ?and there is cortical thinning similar to that of the right kidney. ?There are parapelvic cysts and a probable extrarenal pelvis. A ?midpole cyst measures 1.5 by 1.5 x 1.3 cm. A lower pole cyst ?measures 1.8 x 2.0 x 1.5 cm. ? ?Bladder: ? ?Bilateral ureteral jets are observed. 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 The prevoid urinary bladder volume is 1953 cc. The ?postvoid volume is 1704 cc. ? ?IMPRESSION: ?1. Mild bilateral renal cortical thinning and increased  cortical ?echotexture consistent with medical renal disease. There is cysts ?within both kidneys. The cyst on the right exhibits some increased ?echogenicity in its far wall. There is no definite hydronephrosis. ?2. [place]

## 2022-01-20 NOTE — Progress Notes (Signed)
Cath Change/ Replacement ? ?Patient is present today for a catheter change due to urinary retention.  33m of water was removed from the balloon, a 16FR foley cath was removed with out difficulty.  Patient was cleaned and prepped in a sterile fashion with betadine. A 16 FR foley cath was replaced into the bladder no complications were noted Urine return was noted 151mand urine was yellow in color. The balloon was filled with 1063mf sterile water. A leg bag was attached for drainage.  A night bag was also given to the patient and patient was given instruction on how to change from one bag to another. Patient was given proper instruction on catheter care.   ? ?Performed by: KouLevi AlandMA ? ?Follow up: Follow up as scheduled.   ?

## 2022-01-21 LAB — URINALYSIS, ROUTINE W REFLEX MICROSCOPIC
Bilirubin, UA: NEGATIVE
Glucose, UA: NEGATIVE
Nitrite, UA: NEGATIVE
Specific Gravity, UA: 1.025 (ref 1.005–1.030)
Urobilinogen, Ur: 0.2 mg/dL (ref 0.2–1.0)
pH, UA: 7 (ref 5.0–7.5)

## 2022-01-24 LAB — URINE CULTURE

## 2022-01-27 ENCOUNTER — Telehealth: Payer: Self-pay

## 2022-01-27 NOTE — Telephone Encounter (Signed)
Tried to call patient, no answer and unable to leave voicemail.  Will try again at a later time.

## 2022-01-27 NOTE — Telephone Encounter (Signed)
-----   Message from [place], [place] sent at 01/27/2022  9:41 AM EDT ----- Please let pt know his cx result shows more than one bacteria, but the lab needs another urine cx to identify the type of bacteria. Please bring him in for cath urine to do MDX cx. He may be nurse visit on my schedule.  He needs to complete the Augmentin Rx for now. ----- Message ----- From: Interface, [facility] Lab Results In Sent: 01/21/2022   8:59 AM EDT To: Reynaldo Minium, PA-C

## 2022-01-28 ENCOUNTER — Telehealth: Payer: Self-pay

## 2022-01-28 NOTE — Telephone Encounter (Signed)
-----   Message from [place], [place] sent at 01/27/2022  9:41 AM EDT ----- Please let pt know his cx result shows more than one bacteria, but the lab needs another urine cx to identify the type of bacteria. Please bring him in for cath urine to do MDX cx. He may be nurse visit on my schedule.  He needs to complete the Augmentin Rx for now. ----- Message ----- From: Interface, [facility] Lab Results In Sent: 01/21/2022   8:59 AM EDT To: Reynaldo Minium, PA-C

## 2022-01-28 NOTE — Telephone Encounter (Signed)
Tried to reach patient several time with no answer and unable to leave voicemail.

## 2022-01-29 ENCOUNTER — Telehealth: Payer: Self-pay

## 2022-01-29 NOTE — Telephone Encounter (Signed)
Unable to reach patient after multiple attempts and no voicemail set up.  Letter sent

## 2022-01-29 NOTE — Telephone Encounter (Signed)
-----   Message from [place], [place] sent at 01/27/2022  9:41 AM EDT ----- Please let pt know his cx result shows more than one bacteria, but the lab needs another urine cx to identify the type of bacteria. Please bring him in for cath urine to do MDX cx. He may be nurse visit on my schedule.  He needs to complete the Augmentin Rx for now. ----- Message ----- From: Interface, [facility] Lab Results In Sent: 01/21/2022   8:59 AM EDT To: Reynaldo Minium, PA-C

## 2022-02-19 ENCOUNTER — Ambulatory Visit (INDEPENDENT_AMBULATORY_CARE_PROVIDER_SITE_OTHER): Payer: Medicare HMO | Admitting: Physician Assistant

## 2022-02-19 DIAGNOSIS — R339 Retention of urine, unspecified: Secondary | ICD-10-CM | POA: Diagnosis not present

## 2022-02-19 NOTE — Progress Notes (Signed)
Cath Change/ Replacement  Patient is present today for a catheter change due to urinary retention.  29m of water was removed from the balloon, a 16FR foley cath was removed with out difficulty.  Patient was cleaned and prepped in a sterile fashion with betadine. A 16 FR foley cath was replaced into the bladder no complications were noted Urine return was noted 331mand urine was yellow in color. The balloon was filled with 1067mf sterile water. A leg bag was attached for drainage.  A night bag was also given to the patient and patient was given instruction on how to change from one bag to another. Patient was given proper instruction on catheter care.    Performed by: KouLevi AlandMA  Follow up: Follow up in 1 month for catheter change.

## 2022-03-19 ENCOUNTER — Ambulatory Visit (INDEPENDENT_AMBULATORY_CARE_PROVIDER_SITE_OTHER): Payer: Medicare HMO | Admitting: Physician Assistant

## 2022-03-19 DIAGNOSIS — R339 Retention of urine, unspecified: Secondary | ICD-10-CM

## 2022-03-19 NOTE — Progress Notes (Signed)
Cath Change/ Replacement  Patient is present today for a catheter change due to urinary retention.  38m of water was removed from the balloon, a 16FR foley cath was removed with out difficulty.  Patient was cleaned and prepped in a sterile fashion with betadine. A 16 FR  coude foley cath was replaced into the bladder no complications were noted Urine return was noted 0 ml patient was flush with 15mNS and 1031mas noted and urine was pale yellow in color. The balloon was filled with 39m101m sterile water. A leg bag was attached for drainage.  A night bag was also given to the patient and patient was given instruction on how to change from one bag to another. Patient was given proper instruction on catheter care.    Performed by: ShanMarisue BrooklynA  Follow up: 1 month nurse visit

## 2022-04-20 ENCOUNTER — Ambulatory Visit (INDEPENDENT_AMBULATORY_CARE_PROVIDER_SITE_OTHER): Payer: Medicare HMO | Admitting: Physician Assistant

## 2022-04-20 DIAGNOSIS — N390 Urinary tract infection, site not specified: Secondary | ICD-10-CM | POA: Diagnosis not present

## 2022-04-20 DIAGNOSIS — E782 Mixed hyperlipidemia: Secondary | ICD-10-CM | POA: Diagnosis not present

## 2022-04-20 DIAGNOSIS — K2100 Gastro-esophageal reflux disease with esophagitis, without bleeding: Secondary | ICD-10-CM | POA: Diagnosis not present

## 2022-04-20 DIAGNOSIS — D649 Anemia, unspecified: Secondary | ICD-10-CM | POA: Diagnosis not present

## 2022-04-20 DIAGNOSIS — I10 Essential (primary) hypertension: Secondary | ICD-10-CM | POA: Diagnosis not present

## 2022-04-20 DIAGNOSIS — R339 Retention of urine, unspecified: Secondary | ICD-10-CM

## 2022-04-20 DIAGNOSIS — E1165 Type 2 diabetes mellitus with hyperglycemia: Secondary | ICD-10-CM | POA: Diagnosis not present

## 2022-04-20 DIAGNOSIS — E876 Hypokalemia: Secondary | ICD-10-CM | POA: Diagnosis not present

## 2022-04-20 DIAGNOSIS — E1122 Type 2 diabetes mellitus with diabetic chronic kidney disease: Secondary | ICD-10-CM | POA: Diagnosis not present

## 2022-04-20 DIAGNOSIS — R82998 Other abnormal findings in urine: Secondary | ICD-10-CM

## 2022-04-20 DIAGNOSIS — D519 Vitamin B12 deficiency anemia, unspecified: Secondary | ICD-10-CM | POA: Diagnosis not present

## 2022-04-20 DIAGNOSIS — D529 Folate deficiency anemia, unspecified: Secondary | ICD-10-CM | POA: Diagnosis not present

## 2022-04-20 DIAGNOSIS — E7849 Other hyperlipidemia: Secondary | ICD-10-CM | POA: Diagnosis not present

## 2022-04-20 NOTE — Progress Notes (Signed)
Assessment: 1. Urinary retention - Urinalysis, Routine w reflex microscopic - PR CHANGE OF BLADDER TUBE,SIMPLE  2. Urinary tract infection without hematuria, site unspecified - Urine Culture    Plan: Discussed risk of infection due to urine reflux into the bladder the patient is not changing his leg bag at night.  This has been addressed with him in recent visits as well.  He is again instructed to use at nighttime bags and to change all of his urine bags appropriately.  Urine sent for culture and will treat if indicated by results.  He has follow-up scheduled in 1 month for catheter change.  Chief Complaint: No chief complaint on file.   HPI: Jimmy Franklin is a 88 y.o. male who presents for continued evaluation of urinary retention with scheduled foley change. Pt states he has been doing well except he has noted his urine to be very dark for the past 3 weeks. He admits that he does not empty his urine bag when full at night and is concerned that reflux has caused UTI. Pt has nighttime bags, but isn't using them. He denies fever, chills, NV. No abdominal pain, flank pain. Pt denies gross hematuria.  No fever, chills, other symptoms of illness.  UA= sterile collection from catheter change concerning for infection.  Nitrite negative  01/20/22 Jimmy Franklin is a 88 y.o. male who presents for continued evaluation of chronic urinary retention and catheter change. Upon presentation, the pt c/o UTI sxs of burning around the catheter and change in urine appearance. Pt has h/o recent symptomatic UTIs.  He denies fever, chills, abdominal pain, nausea or vomiting.  He admits that he never started using the urine gravity bag.  He wears a leg bag at all times and states that he lets it get completely full before he changes it which is only twice daily.  Patient's most recent culture on 12/02/2021 was Proteus mirabilis, 100,000 colonies, mixed urogenital flora 25 K colonies, Proteus was resistant  to nitrofurantoin and tetracycline. CT Urogram still pending.   Urine dip= 3+ leukocytes, 3+ blood, nitrite negative. Only enough specimen for a dip, microscopic exam cannot be performed as urine sent for culture   12/23/21 History of BPH with urinary retention.  He has had several procedures for his bladder outlet/prostate, and despite these he still has poor emptying.  He has been catheter dependent for some time.  Most recent urine culture grew Proteus.  He has a pending CT urogram.  He denies any issues with his chronic catheter placement.   Urine cx on 01/06/21 grew Citrobacter freundii and Enterococcus faecalis both greater than 100,000 colonies.  Resistance to penicillin, cefazolin, cefuroxime, intermediate resistance to Levaquin and Macrobid.  Also resistant to tetracycline and sulfa.  Portions of the above documentation were copied from a prior visit for review purposes only.  Allergies: No Known Allergies  PMH: Past Medical History:  Diagnosis Date   Bowel habit changes 03/21/2011   Chronic constipation 08   COPD (chronic obstructive pulmonary disease) ([facility])    Depression    Enlarged prostate    Foley catheter in place 03-17-13   GERD (gastroesophageal reflux disease)    Hearing loss of both ears    Hepatitis    20 yrs ago, states he turned yellow    Hx of colonic polyps    Mixed hyperlipidemia    Polymyalgia rheumatica ([place])    Seizures ([place])    caused by UTI-none recent- over  2 yr  Stroke [facility])    CVA- 2013, left sided weakness, mild residual   Subarachnoid hemorrhage ([place])    Unspecified essential hypertension     PSH: Past Surgical History:  Procedure Laterality Date   BIOPSY  07/08/2017   Procedure: BIOPSY;  Surgeon: Daneil Dolin, MD;  [facility];  Service: Endoscopy;;  gastric   CATARACT EXTRACTION, BILATERAL     CHOLECYSTECTOMY     lap. gallbladder removal   COLONOSCOPY  07/04/07   ESOPHAGOGASTRODUODENOSCOPY (EGD) WITH PROPOFOL N/A  07/08/2017   Procedure: ESOPHAGOGASTRODUODENOSCOPY (EGD) WITH PROPOFOL;  Surgeon: Daneil Dolin, MD;  [facility];  Service: Endoscopy;  Laterality: N/A;  7:30am   MALONEY DILATION N/A 07/08/2017   Procedure: Venia Minks DILATION;  Surgeon: Daneil Dolin, MD;  [facility];  Service: Endoscopy;  Laterality: N/A;   TONSILLECTOMY     TRANSURETHRAL INCISION OF BLADDER NECK N/A 01/24/2015   Procedure: TRANSURETHRAL INCISION OF BLADDER NECK;  Surgeon: Franchot Gallo, MD;  [facility];  Service: Urology;  Laterality: N/A;  **TRANSURETHRAL RESECTION BLADDER NECK CONTRACTURE**      TRANSURETHRAL RESECTION OF PROSTATE N/A 03/23/2013   Procedure: TRANSURETHRAL RESECTION OF THE PROSTATE (TURP);  Surgeon: Franchot Gallo, MD;  [facility];  Service: Urology;  Laterality: N/A;    SH: Social History   Tobacco Use   Smoking status: Former    Packs/day: 3.00    Years: 15.00    Total pack years: 45.00    Types: Cigarettes    Quit date: 03/18/1967    Years since quitting: 55.1   Smokeless tobacco: Former  Scientific laboratory technician Use: Never used  Substance Use Topics   Alcohol use: No   Drug use: No    ROS: All other review of systems were reviewed and are negative except what is noted above in HPI  PE: There were no vitals taken for this visit. GENERAL APPEARANCE:  Well appearing, well developed, well nourished, NAD HEENT:  Atraumatic, normocephalic NECK:  Supple. Trachea midline ABDOMEN:  Soft, non-tender, no masses EXTREMITIES:  Moves all extremities well NEUROLOGIC:  Alert and oriented x 3 MENTAL STATUS:  appropriate BACK:  Non-tender to palpation, No CVAT SKIN:  Warm, dry, and intact   Results: Laboratory Data: Lab Results  Component Value Date   WBC 10.2 06/12/2017   HGB 13.7 06/12/2017   HCT 41.3 06/12/2017   MCV 84.5 06/12/2017   PLT 121 (L) 06/12/2017    Lab Results  Component Value Date   CREATININE 0.75 06/12/2017     Urinalysis     Component Value Date/Time   COLORURINE YELLOW 05/17/2019 1210   APPEARANCEUR Cloudy (A) 01/20/2022 1515   LABSPEC 1.015 05/17/2019 1210   PHURINE 7.0 05/17/2019 1210   GLUCOSEU Negative 01/20/2022 1515   HGBUR MODERATE (A) 05/17/2019 1210   BILIRUBINUR Negative 01/20/2022 1515   KETONESUR 5 (A) 05/17/2019 1210   PROTEINUR 3+ (A) 01/20/2022 1515   PROTEINUR 100 (A) 05/17/2019 1210   UROBILINOGEN 1.0 02/14/2012 0427   NITRITE Negative 01/20/2022 1515   NITRITE NEGATIVE 05/17/2019 1210   LEUKOCYTESUR 3+ (A) 01/20/2022 1515   LEUKOCYTESUR LARGE (A) 05/17/2019 1210    Lab Results  Component Value Date   LABMICR Comment 01/20/2022   WBCUA >30 (A) 01/06/2021   LABEPIT >10 (A) 01/06/2021   MUCUS Present 01/06/2021   BACTERIA Many (A) 01/06/2021    Pertinent Imaging: No results found for this or any previous visit.  No  results found for this or any previous visit.  No results found for this or any previous visit.  No results found for this or any previous visit.  Results for orders placed during the hospital encounter of 07/06/16  US Renal  Narrative CLINICAL DATA:  History of atonic bladder  EXAM: RENAL / URINARY TRACT ULTRASOUND COMPLETE  COMPARISON:  Abdominal and pelvic CT scan of  January 02, 2015  FINDINGS: Right Kidney:  Length: 12.7 cm. The renal cortical echotexture is mildly increased and is approximately equal to that of the adjacent liver. There is a cystic structure with increased echogenicity of the far wall measuring 1.7 x 1.3 x 1.4 cm. It lies in the midpole. There is no hydronephrosis.  Left Kidney:  Length: 13.8 cm. The renal cortical echotexture is mildly increased and there is cortical thinning similar to that of the right kidney. There are parapelvic cysts and a probable extrarenal pelvis. A midpole cyst measures 1.5 by 1.5 x 1.3 cm. A lower pole cyst measures 1.8 x 2.0 x 1.5 cm.  Bladder:  Bilateral ureteral jets are observed. A large  posterior bladder diverticulum is demonstrated and has been previously demonstrated on CT imaging. The prevoid urinary bladder volume is 1953 cc. The postvoid volume is 1704 cc.  IMPRESSION: 1. Mild bilateral renal cortical thinning and increased cortical echotexture consistent with medical renal disease. There is cysts within both kidneys. The cyst on the right exhibits some increased echogenicity in its far wall. There is no definite hydronephrosis. 2. Large posterior urinary bladder diverticulum. Minimal emptying of the urinary bladder with large postvoid residual bladder and diverticulum volume.   Electronically Signed By: David  Martinique M.D. On: 07/06/2016 13:56  No results found for this or any previous visit.  No results found for this or any previous visit.  Results for orders placed during the hospital encounter of 06/14/20  CT RENAL STONE STUDY  Narrative CLINICAL DATA:  Flank pain.  Evaluate for kidney stones.  EXAM: CT ABDOMEN AND PELVIS WITHOUT CONTRAST  TECHNIQUE: Multidetector CT imaging of the abdomen and pelvis was performed following the standard protocol without IV contrast.  COMPARISON:  01/02/2015  FINDINGS: Lower chest: No acute abnormality. Aortic atherosclerosis. Coronary artery calcifications noted.  Hepatobiliary: No focal liver abnormality is seen. Status post cholecystectomy. No biliary dilatation.  Pancreas: Unremarkable. No pancreatic ductal dilatation or surrounding inflammatory changes.  Spleen: Normal in size without focal abnormality. Several calcified granulomas noted.  Adrenals/Urinary Tract: Normal appearance of the adrenal glands. Bilateral parapelvic cysts are identified. Small exophytic lesion arising from lateral cortex of inferior pole of right kidney is too small to characterize measuring 8 mm. No kidney stones identified bilaterally. No hydronephrosis or hydroureter. No ureteral calculi. Urinary bladder is partially  collapsed around a Foley catheter. Again seen is diffuse bladder wall thickening with surrounding soft tissue stranding. 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 Within the dependent portion of this diverticulum there is a 6 mm stone, image 74/2.  Stomach/Bowel: Stomach is nondistended. No bowel wall thickening, inflammation or distension. The appendix is visualized and appears within normal limits.  Vascular/Lymphatic: Aortic atherosclerosis. No aneurysm. No abdominopelvic adenopathy identified.  Reproductive: Prostate gland enlargement.  Other: No free fluid or fluid collections. Fat containing umbilical hernia noted.  Musculoskeletal: Multilevel degenerative disc disease identified within the lumbar spine. Bilateral facet hypertrophy and degenerative change also noted at L3-4, L4-5 and L5-S1.  IMPRESSION: 1. No acute findings  identified within the abdomen or pelvis. 2. Persistent diffuse bladder wall thickening with surrounding soft tissue stranding. Correlate for any clinical signs or symptoms of cystitis or chronic bladder outlet obstruction. 3. Large diverticulum posterior to the bladder is again noted. Within the dependent portion of this diverticulum there is a 6 mm calcification. 4. Prostate gland enlargement. 5. Fat containing umbilical hernia. 6. Coronary artery calcifications noted. 7. Lumbar spondylosis. 8. Aortic atherosclerosis.  Aortic Atherosclerosis (ICD10-I70.0).   Electronically Signed By: Kerby Moors M.D. On: 06/14/2020 09:57  No results found for this or any previous visit (from the past 24 hour(s)).

## 2022-04-20 NOTE — Progress Notes (Unsigned)
Cath Change/ Replacement  Patient is present today for a catheter change due to urinary retention.  45m of water was removed from the balloon, a 16FR coude foley cath was removed with out difficulty.  Patient was cleaned and prepped in a sterile fashion with betadine. A 16 FR coude foley cath was replaced into the bladder no complications were noted Urine return was noted 135mand urine was cloudy in color. The balloon was filled with 1066mf sterile water. A leg bag was attached for drainage.  A night bag was also given to the patient and patient was given instruction on how to change from one bag to another. Patient was given proper instruction on catheter care.    Performed by: AmaEstill Bamberg  Follow up: patient to see JilSharee Pimple/o of UTI symptoms.

## 2022-04-22 LAB — URINALYSIS, ROUTINE W REFLEX MICROSCOPIC
Bilirubin, UA: NEGATIVE
Glucose, UA: NEGATIVE
Ketones, UA: NEGATIVE
Nitrite, UA: NEGATIVE
Specific Gravity, UA: 1.015 (ref 1.005–1.030)
Urobilinogen, Ur: 0.2 mg/dL (ref 0.2–1.0)
pH, UA: 7.5 (ref 5.0–7.5)

## 2022-04-22 LAB — MICROSCOPIC EXAMINATION: WBC, UA: >30 /hpf — AB (ref 0–5)

## 2022-04-23 ENCOUNTER — Telehealth: Payer: Self-pay

## 2022-04-23 ENCOUNTER — Other Ambulatory Visit: Payer: Self-pay | Admitting: Physician Assistant

## 2022-04-23 DIAGNOSIS — G40309 Generalized idiopathic epilepsy and epileptic syndromes, not intractable, without status epilepticus: Secondary | ICD-10-CM | POA: Diagnosis not present

## 2022-04-23 DIAGNOSIS — I700 Atherosclerosis of aorta: Secondary | ICD-10-CM | POA: Diagnosis not present

## 2022-04-23 DIAGNOSIS — F331 Major depressive disorder, recurrent, moderate: Secondary | ICD-10-CM | POA: Diagnosis not present

## 2022-04-23 DIAGNOSIS — I10 Essential (primary) hypertension: Secondary | ICD-10-CM | POA: Diagnosis not present

## 2022-04-23 DIAGNOSIS — K5901 Slow transit constipation: Secondary | ICD-10-CM | POA: Diagnosis not present

## 2022-04-23 DIAGNOSIS — E7849 Other hyperlipidemia: Secondary | ICD-10-CM | POA: Diagnosis not present

## 2022-04-23 DIAGNOSIS — D500 Iron deficiency anemia secondary to blood loss (chronic): Secondary | ICD-10-CM | POA: Diagnosis not present

## 2022-04-23 DIAGNOSIS — G8114 Spastic hemiplegia affecting left nondominant side: Secondary | ICD-10-CM | POA: Diagnosis not present

## 2022-04-23 DIAGNOSIS — D692 Other nonthrombocytopenic purpura: Secondary | ICD-10-CM | POA: Diagnosis not present

## 2022-04-23 LAB — URINE CULTURE

## 2022-04-23 MED ORDER — AMOXICILLIN-POT CLAVULANATE 875-125 MG PO TABS: 1.0000 | ORAL_TABLET | Freq: Two times a day (BID) | ORAL | 0 refills | Status: DC

## 2022-04-23 NOTE — Telephone Encounter (Signed)
Contacted pharmacy to see if there was another contact number for patient. Pharmacy has same number as clinic.  Letter will be sent.

## 2022-04-23 NOTE — Progress Notes (Signed)
Letter sent.

## 2022-04-23 NOTE — Telephone Encounter (Signed)
Tried to call patient with no answer and unable to leave voicemail.

## 2022-04-23 NOTE — Telephone Encounter (Signed)
-----   Message from Reynaldo Minium, [place] sent at 04/23/2022  8:39 AM EDT ----- Please let the patient know that his urine culture is positive for a urinary tract infection and a prescription for Augmentin has been sent to his pharmacy.  Also let him know that this infection since she does some resistance to many antibiotics and please remind the patient that he absolutely must change his urine bags as previously discussed in order to prevent this from happening in the future and remind him how incredibly ill he could become because of urine reflux back into the bladder from his unchanged Foley bags. ----- Message ----- From: Lavone Neri Lab Results In Sent: 04/22/2022   5:38 AM EDT To: Reynaldo Minium, PA-C

## 2022-04-23 NOTE — Telephone Encounter (Addendum)
-----   Message from Reynaldo Minium, [place] sent at 04/23/2022  8:39 AM EDT ----- Please let the patient know that his urine culture is positive for a urinary tract infection and a prescription for Augmentin has been sent to his pharmacy.  Also let him know that this infection since she does some resistance to many antibiotics and please remind the patient that he absolutely must change his urine bags as previously discussed in order to prevent this from happening in the future and remind him how incredibly ill he could become because of urine reflux back into the bladder from his unchanged Foley bags. ----- Message ----- From: Lavone Neri Lab Results In Sent: 04/22/2022   5:38 AM EDT To: Reynaldo Minium, PA-C

## 2022-05-07 DIAGNOSIS — I10 Essential (primary) hypertension: Secondary | ICD-10-CM | POA: Diagnosis not present

## 2022-05-07 DIAGNOSIS — E782 Mixed hyperlipidemia: Secondary | ICD-10-CM | POA: Diagnosis not present

## 2022-05-21 ENCOUNTER — Ambulatory Visit: Payer: Medicare HMO | Admitting: Physician Assistant

## 2022-05-28 DIAGNOSIS — R9082 White matter disease, unspecified: Secondary | ICD-10-CM | POA: Diagnosis not present

## 2022-05-28 DIAGNOSIS — I6602 Occlusion and stenosis of left middle cerebral artery: Secondary | ICD-10-CM | POA: Diagnosis not present

## 2022-05-28 DIAGNOSIS — Z7901 Long term (current) use of anticoagulants: Secondary | ICD-10-CM | POA: Diagnosis not present

## 2022-05-28 DIAGNOSIS — B952 Enterococcus as the cause of diseases classified elsewhere: Secondary | ICD-10-CM | POA: Diagnosis not present

## 2022-05-28 DIAGNOSIS — R000 Tachycardia, unspecified: Secondary | ICD-10-CM | POA: Diagnosis not present

## 2022-05-28 DIAGNOSIS — I639 Cerebral infarction, unspecified: Secondary | ICD-10-CM | POA: Diagnosis not present

## 2022-05-28 DIAGNOSIS — I080 Rheumatic disorders of both mitral and aortic valves: Secondary | ICD-10-CM | POA: Diagnosis not present

## 2022-05-28 DIAGNOSIS — G8191 Hemiplegia, unspecified affecting right dominant side: Secondary | ICD-10-CM | POA: Diagnosis not present

## 2022-05-28 DIAGNOSIS — I629 Nontraumatic intracranial hemorrhage, unspecified: Secondary | ICD-10-CM | POA: Diagnosis not present

## 2022-05-28 DIAGNOSIS — R0902 Hypoxemia: Secondary | ICD-10-CM | POA: Diagnosis not present

## 2022-05-28 DIAGNOSIS — R29710 NIHSS score 10: Secondary | ICD-10-CM | POA: Diagnosis not present

## 2022-05-28 DIAGNOSIS — I618 Other nontraumatic intracerebral hemorrhage: Secondary | ICD-10-CM | POA: Diagnosis not present

## 2022-05-28 DIAGNOSIS — I10 Essential (primary) hypertension: Secondary | ICD-10-CM | POA: Diagnosis not present

## 2022-05-28 DIAGNOSIS — R531 Weakness: Secondary | ICD-10-CM | POA: Diagnosis not present

## 2022-05-28 DIAGNOSIS — I63412 Cerebral infarction due to embolism of left middle cerebral artery: Secondary | ICD-10-CM | POA: Diagnosis not present

## 2022-05-28 DIAGNOSIS — D508 Other iron deficiency anemias: Secondary | ICD-10-CM | POA: Diagnosis not present

## 2022-05-28 DIAGNOSIS — N3001 Acute cystitis with hematuria: Secondary | ICD-10-CM | POA: Diagnosis not present

## 2022-05-28 DIAGNOSIS — R4701 Aphasia: Secondary | ICD-10-CM | POA: Diagnosis not present

## 2022-05-28 DIAGNOSIS — R918 Other nonspecific abnormal finding of lung field: Secondary | ICD-10-CM | POA: Diagnosis not present

## 2022-05-28 DIAGNOSIS — N390 Urinary tract infection, site not specified: Secondary | ICD-10-CM | POA: Diagnosis not present

## 2022-05-28 DIAGNOSIS — G9389 Other specified disorders of brain: Secondary | ICD-10-CM | POA: Diagnosis not present

## 2022-05-28 DIAGNOSIS — Z609 Problem related to social environment, unspecified: Secondary | ICD-10-CM | POA: Diagnosis not present

## 2022-05-28 DIAGNOSIS — E7800 Pure hypercholesterolemia, unspecified: Secondary | ICD-10-CM | POA: Diagnosis not present

## 2022-05-28 DIAGNOSIS — I77810 Thoracic aortic ectasia: Secondary | ICD-10-CM | POA: Diagnosis not present

## 2022-05-28 DIAGNOSIS — I517 Cardiomegaly: Secondary | ICD-10-CM | POA: Diagnosis not present

## 2022-05-30 DIAGNOSIS — E559 Vitamin D deficiency, unspecified: Secondary | ICD-10-CM | POA: Insufficient documentation

## 2022-05-30 DIAGNOSIS — I63412 Cerebral infarction due to embolism of left middle cerebral artery: Secondary | ICD-10-CM | POA: Insufficient documentation

## 2022-05-30 DIAGNOSIS — E7800 Pure hypercholesterolemia, unspecified: Secondary | ICD-10-CM | POA: Insufficient documentation

## 2022-05-30 DIAGNOSIS — E538 Deficiency of other specified B group vitamins: Secondary | ICD-10-CM | POA: Insufficient documentation

## 2022-06-09 DIAGNOSIS — Z23 Encounter for immunization: Secondary | ICD-10-CM | POA: Diagnosis not present

## 2022-06-17 ENCOUNTER — Ambulatory Visit (INDEPENDENT_AMBULATORY_CARE_PROVIDER_SITE_OTHER): Payer: Medicare HMO | Admitting: Physician Assistant

## 2022-06-17 DIAGNOSIS — R339 Retention of urine, unspecified: Secondary | ICD-10-CM

## 2022-06-17 DIAGNOSIS — Z9189 Other specified personal risk factors, not elsewhere classified: Secondary | ICD-10-CM | POA: Diagnosis not present

## 2022-06-17 DIAGNOSIS — E7800 Pure hypercholesterolemia, unspecified: Secondary | ICD-10-CM | POA: Diagnosis not present

## 2022-06-17 DIAGNOSIS — R7303 Prediabetes: Secondary | ICD-10-CM | POA: Diagnosis not present

## 2022-06-17 DIAGNOSIS — I10 Essential (primary) hypertension: Secondary | ICD-10-CM | POA: Diagnosis not present

## 2022-06-17 DIAGNOSIS — Z87898 Personal history of other specified conditions: Secondary | ICD-10-CM | POA: Diagnosis not present

## 2022-06-17 DIAGNOSIS — Z8673 Personal history of transient ischemic attack (TIA), and cerebral infarction without residual deficits: Secondary | ICD-10-CM | POA: Diagnosis not present

## 2022-06-17 NOTE — Progress Notes (Signed)
Assessment: 1. Urinary retention - BLADDER SCAN AMB NON-IMAGING    Plan: SP tube not reinserted today as pt is voiding well and adamantly opposes replacement. Discussed pt presentation with Dr. Alyson Ingles in Dr. Geryl Councilman abcence who agrees with plan. Due to the chronic nature of his retention and SP tube, will have pt return next week for UA and PVR with further discussion about his retention hx and recommended tx with Dr. Dorina Hoyer. No new Rx at this time.   Chief Complaint: No chief complaint on file.   HPI: Jimmy Franklin is a 88 y.o. male with h/o BPH who presents for continued evaluation of long-term urinary retention.  He is scheduled for suprapubic cath change, but states his catheter was removed after admission for CVA on 05/28/2022. Pt states he has been able to void without difficulty since removal of the SP tube and would like to leave it out. He has no c/o urgency, frequency, burning, hematuria. Most recent tx for UTI was mid August.  UA= Pt unable to give specimen during OV, but voided as he was leaving. PVR=157m   Allergies: No Known Allergies  PMH: Past Medical History:  Diagnosis Date   Bowel habit changes 03/21/2011   Chronic constipation 08   COPD (chronic obstructive pulmonary disease) (HCC)    Depression    Enlarged prostate    Foley catheter in place 03-17-13   GERD (gastroesophageal reflux disease)    Hearing loss of both ears    Hepatitis    20 yrs ago, states he turned yellow    Hx of colonic polyps    Mixed hyperlipidemia    [place]    [place]    caused by UTI-none recent- over  2 yr   Stroke ([facility]    CVA- 2013, left sided weakness, mild residual   Subarachnoid hemorrhage ([place]    Unspecified essential hypertension     PSH: Past Surgical History:  Procedure Laterality Date   BIOPSY  07/08/2017   Procedure: BIOPSY;  Surgeon: RDaneil Dolin MD;  [facility];  Service: Endoscopy;;  gastric    CATARACT EXTRACTION, BILATERAL     CHOLECYSTECTOMY     lap. gallbladder removal   COLONOSCOPY  07/04/07   ESOPHAGOGASTRODUODENOSCOPY (EGD) WITH PROPOFOL N/A 07/08/2017   Procedure: ESOPHAGOGASTRODUODENOSCOPY (EGD) WITH PROPOFOL;  Surgeon: RDaneil Dolin MD;  [facility];  Service: Endoscopy;  Laterality: N/A;  7:30am   MALONEY DILATION N/A 07/08/2017   Procedure: MVenia MinksDILATION;  Surgeon: RDaneil Dolin MD;  [facility];  Service: Endoscopy;  Laterality: N/A;   TONSILLECTOMY     TRANSURETHRAL INCISION OF BLADDER NECK N/A 01/24/2015   Procedure: TRANSURETHRAL INCISION OF BLADDER NECK;  Surgeon: SFranchot Gallo MD;  [facility];  Service: Urology;  Laterality: N/A;  **TRANSURETHRAL RESECTION BLADDER NECK CONTRACTURE**      TRANSURETHRAL RESECTION OF PROSTATE N/A 03/23/2013   Procedure: TRANSURETHRAL RESECTION OF THE PROSTATE (TURP);  Surgeon: SFranchot Gallo MD;  [facility];  Service: Urology;  Laterality: N/A;    SH: Social History   Tobacco Use   Smoking status: Former    Packs/day: 3.00    Years: 15.00    Total pack years: 45.00    Types: Cigarettes    Quit date: 03/18/1967    Years since quitting: 55.2   Smokeless tobacco: Former  VScientific laboratory technicianUse: Never used  Substance Use Topics   Alcohol use: No  Drug use: No    ROS: All other review of systems were reviewed and are negative except what is noted above in HPI  PE: There were no vitals taken for this visit. GENERAL APPEARANCE:  Well appearing, well developed, well nourished, NAD HEENT:  Atraumatic, normocephalic NECK:  Supple. Trachea midline ABDOMEN:  Soft, non-tender, no masses EXTREMITIES:  Moves all extremities well NEUROLOGIC:  Alert and oriented x 3 MENTAL STATUS:  appropriate BACK:  Non-tender to palpation, No CVAT SKIN:  Warm, dry, and intact   Results: Laboratory Data: Lab Results  Component Value Date   WBC 10.2 06/12/2017   HGB 13.7 06/12/2017    HCT 41.3 06/12/2017   MCV 84.5 06/12/2017   PLT 121 (L) 06/12/2017    Lab Results  Component Value Date   CREATININE 0.75 06/12/2017    No results found for: "PSA"  No results found for: "TESTOSTERONE"  No results found for: "HGBA1C"  Urinalysis    Component Value Date/Time   COLORURINE YELLOW 05/17/2019 1210   APPEARANCEUR Cloudy (A) 04/20/2022 1549   LABSPEC 1.015 05/17/2019 1210   PHURINE 7.0 05/17/2019 1210   GLUCOSEU Negative 04/20/2022 1549   HGBUR MODERATE (A) 05/17/2019 1210   BILIRUBINUR Negative 04/20/2022 1549   KETONESUR 5 (A) 05/17/2019 1210   PROTEINUR 2+ (A) 04/20/2022 1549   PROTEINUR 100 (A) 05/17/2019 1210   UROBILINOGEN 1.0 02/14/2012 0427   NITRITE Negative 04/20/2022 1549   NITRITE NEGATIVE 05/17/2019 1210   LEUKOCYTESUR 2+ (A) 04/20/2022 1549   LEUKOCYTESUR LARGE (A) 05/17/2019 1210    Lab Results  Component Value Date   LABMICR See below: 04/20/2022   WBCUA >30 (A) 04/20/2022   LABEPIT 0-10 04/20/2022   MUCUS Present 01/06/2021   BACTERIA Many (A) 04/20/2022    Pertinent Imaging: No results found for this or any previous visit.  No results found for this or any previous visit.  No results found for this or any previous visit.  No results found for this or any previous visit.  Results for orders placed during the hospital encounter of 07/06/16  US Renal  Narrative CLINICAL DATA:  History of atonic bladder  EXAM: RENAL / URINARY TRACT ULTRASOUND COMPLETE  COMPARISON:  Abdominal and pelvic CT scan of  January 02, 2015  FINDINGS: Right Kidney:  Length: 12.7 cm. The renal cortical echotexture is mildly increased and is approximately equal to that of the adjacent liver. There is a cystic structure with increased echogenicity of the far wall measuring 1.7 x 1.3 x 1.4 cm. It lies in the midpole. There is no hydronephrosis.  Left Kidney:  Length: 13.8 cm. The renal cortical echotexture is mildly increased and there is cortical  thinning similar to that of the right kidney. There are parapelvic cysts and a probable extrarenal pelvis. A midpole cyst measures 1.5 by 1.5 x 1.3 cm. A lower pole cyst measures 1.8 x 2.0 x 1.5 cm.  Bladder:  Bilateral ureteral jets are observed. A large posterior bladder diverticulum is demonstrated and has been previously demonstrated on CT imaging. The prevoid urinary bladder volume is 1953 cc. The postvoid volume is 1704 cc.  IMPRESSION: 1. Mild bilateral renal cortical thinning and increased cortical echotexture consistent with medical renal disease. There is cysts within both kidneys. The cyst on the right exhibits some increased echogenicity in its far wall. There is no definite hydronephrosis. 2. Large posterior urinary bladder diverticulum. Minimal emptying of the urinary bladder with large postvoid residual bladder and diverticulum volume.  Electronically Signed By: David  Martinique M.D. On: 07/06/2016 13:56  No valid procedures specified. No results found for this or any previous visit.  Results for orders placed during the hospital encounter of 06/14/20  CT RENAL STONE STUDY  Narrative CLINICAL DATA:  Flank pain.  Evaluate for kidney stones.  EXAM: CT ABDOMEN AND PELVIS WITHOUT CONTRAST  TECHNIQUE: Multidetector CT imaging of the abdomen and pelvis was performed following the standard protocol without IV contrast.  COMPARISON:  01/02/2015  FINDINGS: Lower chest: No acute abnormality. Aortic atherosclerosis. Coronary artery calcifications noted.  Hepatobiliary: No focal liver abnormality is seen. Status post cholecystectomy. No biliary dilatation.  Pancreas: Unremarkable. No pancreatic ductal dilatation or surrounding inflammatory changes.  Spleen: Normal in size without focal abnormality. Several calcified granulomas noted.  Adrenals/Urinary Tract: Normal appearance of the adrenal glands. Bilateral parapelvic cysts are identified. Small exophytic  lesion arising from lateral cortex of inferior pole of right kidney is too small to characterize measuring 8 mm. No kidney stones identified bilaterally. No hydronephrosis or hydroureter. No ureteral calculi. Urinary bladder is partially collapsed around a Foley catheter. Again seen is diffuse bladder wall thickening with surrounding soft tissue stranding. 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 Within the dependent portion of this diverticulum there is a 6 mm stone, image 74/2.  Stomach/Bowel: Stomach is nondistended. No bowel wall thickening, inflammation or distension. The appendix is visualized and appears within normal limits.  Vascular/Lymphatic: Aortic atherosclerosis. No aneurysm. No abdominopelvic adenopathy identified.  Reproductive: Prostate gland enlargement.  Other: No free fluid or fluid collections. Fat containing umbilical hernia noted.  Musculoskeletal: Multilevel degenerative disc disease identified within the lumbar spine. Bilateral facet hypertrophy and degenerative change also noted at L3-4, L4-5 and L5-S1.  IMPRESSION: 1. No acute findings identified within the abdomen or pelvis. 2. Persistent diffuse bladder wall thickening with surrounding soft tissue stranding. Correlate for any clinical signs or symptoms of cystitis or chronic bladder outlet obstruction. 3. Large diverticulum posterior to the bladder is again noted. Within the dependent portion of this diverticulum there is a 6 mm calcification. 4. Prostate gland enlargement. 5. Fat containing umbilical hernia. 6. Coronary artery calcifications noted. 7. Lumbar spondylosis. 8. Aortic atherosclerosis.  Aortic Atherosclerosis (ICD10-I70.0).   Electronically Signed By: Kerby Moors M.D. On: 06/14/2020 09:57  No results found for this or any previous visit (from the past 24 hour(s)).

## 2022-06-17 NOTE — Progress Notes (Signed)
Patient came in office today for monthly catheter change. Patient reports he was recently in the hospital and they removed his catheter and never replaced.  Bladder scan performed on patient- 117 Will have PA in room to see and discuss with patient plan moving forward

## 2022-06-23 ENCOUNTER — Ambulatory Visit: Payer: Medicare HMO | Admitting: Urology

## 2022-06-30 ENCOUNTER — Ambulatory Visit: Payer: Medicare HMO | Admitting: Urology

## 2022-07-01 DIAGNOSIS — E7849 Other hyperlipidemia: Secondary | ICD-10-CM | POA: Diagnosis not present

## 2022-07-01 DIAGNOSIS — G40309 Generalized idiopathic epilepsy and epileptic syndromes, not intractable, without status epilepticus: Secondary | ICD-10-CM | POA: Diagnosis not present

## 2022-07-01 DIAGNOSIS — D509 Iron deficiency anemia, unspecified: Secondary | ICD-10-CM | POA: Diagnosis not present

## 2022-07-01 DIAGNOSIS — I10 Essential (primary) hypertension: Secondary | ICD-10-CM | POA: Diagnosis not present

## 2022-07-01 DIAGNOSIS — I493 Ventricular premature depolarization: Secondary | ICD-10-CM | POA: Diagnosis not present

## 2022-07-03 DIAGNOSIS — E7800 Pure hypercholesterolemia, unspecified: Secondary | ICD-10-CM | POA: Diagnosis not present

## 2022-07-03 DIAGNOSIS — Z9049 Acquired absence of other specified parts of digestive tract: Secondary | ICD-10-CM | POA: Diagnosis not present

## 2022-07-03 DIAGNOSIS — I69320 Aphasia following cerebral infarction: Secondary | ICD-10-CM | POA: Diagnosis not present

## 2022-07-03 DIAGNOSIS — F32A Depression, unspecified: Secondary | ICD-10-CM | POA: Diagnosis not present

## 2022-07-03 DIAGNOSIS — D649 Anemia, unspecified: Secondary | ICD-10-CM | POA: Diagnosis not present

## 2022-07-03 DIAGNOSIS — I10 Essential (primary) hypertension: Secondary | ICD-10-CM | POA: Diagnosis not present

## 2022-07-03 DIAGNOSIS — I69351 Hemiplegia and hemiparesis following cerebral infarction affecting right dominant side: Secondary | ICD-10-CM | POA: Diagnosis not present

## 2022-07-03 DIAGNOSIS — N390 Urinary tract infection, site not specified: Secondary | ICD-10-CM | POA: Diagnosis not present

## 2022-07-07 DIAGNOSIS — E782 Mixed hyperlipidemia: Secondary | ICD-10-CM | POA: Diagnosis not present

## 2022-07-07 DIAGNOSIS — I10 Essential (primary) hypertension: Secondary | ICD-10-CM | POA: Diagnosis not present

## 2022-07-20 NOTE — Progress Notes (Incomplete)
H&P  Chief Complaint: ***  History of Present Illness: ***  Past Medical History:  Diagnosis Date   Bowel habit changes 03/21/2011   Chronic constipation 08   COPD (chronic obstructive pulmonary disease) (HCC)    Depression    Enlarged prostate    Foley catheter in place 03-17-13   GERD (gastroesophageal reflux disease)    Hearing loss of both ears    Hepatitis    20 yrs ago, states he turned yellow    Hx of colonic polyps    Mixed hyperlipidemia    Polymyalgia rheumatica ([place])    Seizures ([place])    caused by UTI-none recent- over  2 yr   Stroke [facility])    CVA- 2013, left sided weakness, mild residual   Subarachnoid hemorrhage ([place])    Unspecified essential hypertension     Past Surgical History:  Procedure Laterality Date   BIOPSY  07/08/2017   Procedure: BIOPSY;  Surgeon: Daneil Dolin, MD;  [facility];  Service: Endoscopy;;  gastric   CATARACT EXTRACTION, BILATERAL     CHOLECYSTECTOMY     lap. gallbladder removal   COLONOSCOPY  07/04/07   ESOPHAGOGASTRODUODENOSCOPY (EGD) WITH PROPOFOL N/A 07/08/2017   Procedure: ESOPHAGOGASTRODUODENOSCOPY (EGD) WITH PROPOFOL;  Surgeon: Daneil Dolin, MD;  [facility];  Service: Endoscopy;  Laterality: N/A;  7:30am   MALONEY DILATION N/A 07/08/2017   Procedure: Venia Minks DILATION;  Surgeon: Daneil Dolin, MD;  [facility];  Service: Endoscopy;  Laterality: N/A;   TONSILLECTOMY     TRANSURETHRAL INCISION OF BLADDER NECK N/A 01/24/2015   Procedure: TRANSURETHRAL INCISION OF BLADDER NECK;  Surgeon: Franchot Gallo, MD;  [facility];  Service: Urology;  Laterality: N/A;  **TRANSURETHRAL RESECTION BLADDER NECK CONTRACTURE**      TRANSURETHRAL RESECTION OF PROSTATE N/A 03/23/2013   Procedure: TRANSURETHRAL RESECTION OF THE PROSTATE (TURP);  Surgeon: Franchot Gallo, MD;  [facility];  Service: Urology;  Laterality: N/A;    Home Medications:  Allergies as of 07/21/2022   No Known  Allergies      Medication List        Accurate as of July 20, 2022  9:26 PM. If you have any questions, ask your nurse or doctor.          amoxicillin-clavulanate 875-125 MG tablet Commonly known as: AUGMENTIN Take 1 tablet by mouth every 12 (twelve) hours.   buPROPion 150 MG 24 hr tablet Commonly known as: WELLBUTRIN XL   clonazePAM 0.5 MG tablet Commonly known as: KLONOPIN Take 0.5 mg by mouth at bedtime.   clopidogrel 75 MG tablet Commonly known as: PLAVIX Take 75 mg by mouth daily.   escitalopram 20 MG tablet Commonly known as: LEXAPRO Take 20 mg by mouth daily.   furosemide 20 MG tablet Commonly known as: LASIX Take 20 mg by mouth every morning.   liothyronine 25 MCG tablet Commonly known as: CYTOMEL Take 25 mcg by mouth daily.   lisinopril 20 MG tablet Commonly known as: ZESTRIL Take 20 mg by mouth every morning.   lisinopril 10 MG tablet Commonly known as: ZESTRIL Take 10 mg by mouth every morning.   metoprolol succinate 50 MG 24 hr tablet Commonly known as: TOPROL-XL Take 50 mg by mouth 2 (two) times daily. Take with or immediately following a meal.   pantoprazole 40 MG tablet Commonly known as: PROTONIX TAKE 1 TABLET BY MOUTH ONCE DAILY.   phenytoin 100 MG ER capsule Commonly known as: DILANTIN  Take 200 mg by mouth 2 (two) times daily.   ranitidine 300 MG tablet Commonly known as: ZANTAC Take 300 mg by mouth daily.   sertraline 100 MG tablet Commonly known as: ZOLOFT   simvastatin 40 MG tablet Commonly known as: ZOCOR Take 40 mg by mouth daily.   sulfamethoxazole-trimethoprim 800-160 MG tablet Commonly known as: BACTRIM DS Take 1 tablet by mouth every 12 (twelve) hours.        Allergies: No Known Allergies  Family History  Problem Relation Age of Onset   Colon cancer Mother    Colon cancer Father     Social History:  reports that he quit smoking about 55 years ago. His smoking use included cigarettes. He has a 45.00  pack-year smoking history. He has quit using smokeless tobacco. He reports that he does not drink alcohol and does not use drugs.  ROS: A complete review of systems was performed.  All systems are negative except for pertinent findings as noted.  Physical Exam:  Vital signs in last 24 hours: There were no vitals taken for this visit. Constitutional:  Alert and oriented, No acute distress Cardiovascular: Regular rate  Respiratory: Normal respiratory effort GI: Abdomen is soft, nontender, nondistended, no abdominal masses. No CVAT.  Genitourinary: Normal male phallus, testes are descended bilaterally and non-tender and without masses, scrotum is normal in appearance without lesions or masses, perineum is normal on inspection. Lymphatic: No lymphadenopathy Neurologic: Grossly intact, no focal deficits Psychiatric: Normal mood and affect  I have reviewed prior pt notes  I have reviewed notes from referring/previous physicians  I have reviewed urinalysis results  I have independently reviewed prior imaging  I have reviewed prior PSA results  I have reviewed prior urine culture   Impression/Assessment:  ***  Plan:  ***

## 2022-07-21 ENCOUNTER — Ambulatory Visit: Payer: Medicare HMO

## 2022-07-21 ENCOUNTER — Ambulatory Visit: Payer: Medicare HMO | Admitting: Urology

## 2022-08-06 DIAGNOSIS — I10 Essential (primary) hypertension: Secondary | ICD-10-CM | POA: Diagnosis not present

## 2022-08-06 DIAGNOSIS — E782 Mixed hyperlipidemia: Secondary | ICD-10-CM | POA: Diagnosis not present

## 2022-08-25 ENCOUNTER — Ambulatory Visit: Payer: Medicare HMO | Admitting: Urology

## 2022-08-25 ENCOUNTER — Encounter: Payer: Self-pay | Admitting: Urology

## 2022-08-25 VITALS — BP 151/72 | HR 98

## 2022-08-25 DIAGNOSIS — N401 Enlarged prostate with lower urinary tract symptoms: Secondary | ICD-10-CM

## 2022-08-25 DIAGNOSIS — R339 Retention of urine, unspecified: Secondary | ICD-10-CM | POA: Diagnosis not present

## 2022-08-25 DIAGNOSIS — N320 Bladder-neck obstruction: Secondary | ICD-10-CM

## 2022-08-25 LAB — BLADDER SCAN AMB NON-IMAGING: Scan Result: >119

## 2022-08-25 NOTE — Progress Notes (Signed)
History of Present Illness:   History of BPH with urinary retention. He has had several procedures for his bladder outlet/prostate, and despite these he still had poor emptying.  Due to poor emptying, until several months ago had a chronic indwelling Foley catheter.  This was discontinued in October.  Follow-up revealed a residual urine volume of only 117 mL.  12.19.2023: Here for recheck.  He has not had any dysuria or gross hematuria.  He feels that his stream is fairly good.  He has not been treated for urinary infection recently.    Past Medical History:  Diagnosis Date   Bowel habit changes 03/21/2011   Chronic constipation 08   COPD (chronic obstructive pulmonary disease) (HCC)    Depression    Enlarged prostate    Foley catheter in place 03-17-13   GERD (gastroesophageal reflux disease)    Hearing loss of both ears    Hepatitis    20 yrs ago, states he turned yellow    Hx of colonic polyps    Mixed hyperlipidemia    Polymyalgia rheumatica ([place])    Seizures ([place])    caused by UTI-none recent- over  2 yr   Stroke [facility])    CVA- 2013, left sided weakness, mild residual   Subarachnoid hemorrhage ([place])    Unspecified essential hypertension     Past Surgical History:  Procedure Laterality Date   BIOPSY  07/08/2017   Procedure: BIOPSY;  Surgeon: Daneil Dolin, MD;  [facility];  Service: Endoscopy;;  gastric   CATARACT EXTRACTION, BILATERAL     CHOLECYSTECTOMY     lap. gallbladder removal   COLONOSCOPY  07/04/07   ESOPHAGOGASTRODUODENOSCOPY (EGD) WITH PROPOFOL N/A 07/08/2017   Procedure: ESOPHAGOGASTRODUODENOSCOPY (EGD) WITH PROPOFOL;  Surgeon: Daneil Dolin, MD;  [facility];  Service: Endoscopy;  Laterality: N/A;  7:30am   MALONEY DILATION N/A 07/08/2017   Procedure: Venia Minks DILATION;  Surgeon: Daneil Dolin, MD;  [facility];  Service: Endoscopy;  Laterality: N/A;   TONSILLECTOMY     TRANSURETHRAL INCISION OF BLADDER NECK N/A  01/24/2015   Procedure: TRANSURETHRAL INCISION OF BLADDER NECK;  Surgeon: Franchot Gallo, MD;  [facility];  Service: Urology;  Laterality: N/A;  **TRANSURETHRAL RESECTION BLADDER NECK CONTRACTURE**      TRANSURETHRAL RESECTION OF PROSTATE N/A 03/23/2013   Procedure: TRANSURETHRAL RESECTION OF THE PROSTATE (TURP);  Surgeon: Franchot Gallo, MD;  [facility];  Service: Urology;  Laterality: N/A;    Home Medications:  Allergies as of 08/25/2022   No Known Allergies      Medication List        Accurate as of August 25, 2022  9:21 AM. If you have any questions, ask your nurse or doctor.          amoxicillin-clavulanate 875-125 MG tablet Commonly known as: AUGMENTIN Take 1 tablet by mouth every 12 (twelve) hours.   buPROPion 150 MG 24 hr tablet Commonly known as: WELLBUTRIN XL   clonazePAM 0.5 MG tablet Commonly known as: KLONOPIN Take 0.5 mg by mouth at bedtime.   clopidogrel 75 MG tablet Commonly known as: PLAVIX Take 75 mg by mouth daily.   escitalopram 20 MG tablet Commonly known as: LEXAPRO Take 20 mg by mouth daily.   furosemide 20 MG tablet Commonly known as: LASIX Take 20 mg by mouth every morning.   liothyronine 25 MCG tablet Commonly known as: CYTOMEL Take 25 mcg by mouth daily.   lisinopril 20 MG  tablet Commonly known as: ZESTRIL Take 20 mg by mouth every morning.   lisinopril 10 MG tablet Commonly known as: ZESTRIL Take 10 mg by mouth every morning.   metoprolol succinate 50 MG 24 hr tablet Commonly known as: TOPROL-XL Take 50 mg by mouth 2 (two) times daily. Take with or immediately following a meal.   pantoprazole 40 MG tablet Commonly known as: PROTONIX TAKE 1 TABLET BY MOUTH ONCE DAILY.   phenytoin 100 MG ER capsule Commonly known as: DILANTIN Take 200 mg by mouth 2 (two) times daily.   ranitidine 300 MG tablet Commonly known as: ZANTAC Take 300 mg by mouth daily.   sertraline 100 MG tablet Commonly known as:  ZOLOFT   simvastatin 40 MG tablet Commonly known as: ZOCOR Take 40 mg by mouth daily.   sulfamethoxazole-trimethoprim 800-160 MG tablet Commonly known as: BACTRIM DS Take 1 tablet by mouth every 12 (twelve) hours.        Allergies: No Known Allergies  Family History  Problem Relation Age of Onset   Colon cancer Mother    Colon cancer Father     Social History:  reports that he quit smoking about 55 years ago. His smoking use included cigarettes. He has a 45.00 pack-year smoking history. He has quit using smokeless tobacco. He reports that he does not drink alcohol and does not use drugs.  ROS: A complete review of systems was performed.  All systems are negative except for pertinent findings as noted.  Physical Exam:  Vital signs in last 24 hours: There were no vitals taken for this visit. Constitutional:  Alert and oriented, No acute distress Cardiovascular: Regular rate  Respiratory: Normal respiratory effort GI: Abdomen is soft, nontender, nondistended, no abdominal masses. No CVAT.  Genitourinary: Normal male phallus, testes are descended bilaterally and non-tender and without masses, scrotum is normal in appearance without lesions or masses, perineum is normal on inspection. Lymphatic: No lymphadenopathy Neurologic: Grossly intact, no focal deficits Psychiatric: Normal mood and affect  I have reviewed prior pt notes  I have reviewed notes from referring/previous physicians-hospital results reviewed  I have reviewed urinalysis results--she does have pyuria  I have independently reviewed prior imaging  I have reviewed bladder scan-volume approximately 118 mL I have reviewed prior urine culture   Impression/Assessment:  History of BPH, status post laser prostatectomy with resultant stricture and retention.  He had a long-term indwelling Foley catheter which has been removed now for a few months.  He seems to be draining pretty well.  Plan:  1.  I will culture  her urine but only treat for an odd organism  2.  I will see her back in about 6 months for recheck with bladder scan

## 2022-08-26 LAB — URINALYSIS, ROUTINE W REFLEX MICROSCOPIC
Bilirubin, UA: NEGATIVE
Glucose, UA: NEGATIVE
Ketones, UA: NEGATIVE
Nitrite, UA: NEGATIVE
Specific Gravity, UA: 1.02 (ref 1.005–1.030)
Urobilinogen, Ur: 0.2 mg/dL (ref 0.2–1.0)
pH, UA: 6.5 (ref 5.0–7.5)

## 2022-08-26 LAB — MICROSCOPIC EXAMINATION

## 2022-08-27 DIAGNOSIS — E1122 Type 2 diabetes mellitus with diabetic chronic kidney disease: Secondary | ICD-10-CM | POA: Diagnosis not present

## 2022-08-27 DIAGNOSIS — I700 Atherosclerosis of aorta: Secondary | ICD-10-CM | POA: Diagnosis not present

## 2022-08-27 DIAGNOSIS — E039 Hypothyroidism, unspecified: Secondary | ICD-10-CM | POA: Diagnosis not present

## 2022-08-27 DIAGNOSIS — E1165 Type 2 diabetes mellitus with hyperglycemia: Secondary | ICD-10-CM | POA: Diagnosis not present

## 2022-08-27 DIAGNOSIS — K2100 Gastro-esophageal reflux disease with esophagitis, without bleeding: Secondary | ICD-10-CM | POA: Diagnosis not present

## 2022-08-27 DIAGNOSIS — I10 Essential (primary) hypertension: Secondary | ICD-10-CM | POA: Diagnosis not present

## 2022-08-27 DIAGNOSIS — Z0001 Encounter for general adult medical examination with abnormal findings: Secondary | ICD-10-CM | POA: Diagnosis not present

## 2022-08-27 DIAGNOSIS — F17210 Nicotine dependence, cigarettes, uncomplicated: Secondary | ICD-10-CM | POA: Diagnosis not present

## 2022-08-27 DIAGNOSIS — D649 Anemia, unspecified: Secondary | ICD-10-CM | POA: Diagnosis not present

## 2022-08-27 DIAGNOSIS — E7849 Other hyperlipidemia: Secondary | ICD-10-CM | POA: Diagnosis not present

## 2022-08-31 LAB — URINE CULTURE

## 2022-09-01 DIAGNOSIS — Z0001 Encounter for general adult medical examination with abnormal findings: Secondary | ICD-10-CM | POA: Diagnosis not present

## 2022-09-01 DIAGNOSIS — I509 Heart failure, unspecified: Secondary | ICD-10-CM | POA: Diagnosis not present

## 2022-09-01 DIAGNOSIS — E7849 Other hyperlipidemia: Secondary | ICD-10-CM | POA: Diagnosis not present

## 2022-09-01 DIAGNOSIS — F331 Major depressive disorder, recurrent, moderate: Secondary | ICD-10-CM | POA: Diagnosis not present

## 2022-09-01 DIAGNOSIS — I10 Essential (primary) hypertension: Secondary | ICD-10-CM | POA: Diagnosis not present

## 2022-09-01 DIAGNOSIS — D692 Other nonthrombocytopenic purpura: Secondary | ICD-10-CM | POA: Diagnosis not present

## 2022-09-01 DIAGNOSIS — D500 Iron deficiency anemia secondary to blood loss (chronic): Secondary | ICD-10-CM | POA: Diagnosis not present

## 2022-09-01 DIAGNOSIS — G40309 Generalized idiopathic epilepsy and epileptic syndromes, not intractable, without status epilepticus: Secondary | ICD-10-CM | POA: Diagnosis not present

## 2022-09-01 DIAGNOSIS — K5901 Slow transit constipation: Secondary | ICD-10-CM | POA: Diagnosis not present

## 2022-09-01 DIAGNOSIS — G8114 Spastic hemiplegia affecting left nondominant side: Secondary | ICD-10-CM | POA: Diagnosis not present

## 2022-09-04 ENCOUNTER — Telehealth: Payer: Self-pay

## 2022-09-04 NOTE — Telephone Encounter (Signed)
-----   Message from Franchot Gallo, MD sent at 09/04/2022 11:53 AM EST ----- Please let pt know that no specific bacteria showed up in urine culture, no abx needed ----- Message ----- From: Audie Box, CMA Sent: 09/03/2022   9:01 AM EST To: Franchot Gallo, MD  Please review

## 2022-09-04 NOTE — Telephone Encounter (Signed)
Spoke with granddaughter Jimmy Franklin, she is aware of MD response to urine culture.

## 2022-10-12 DIAGNOSIS — Z6829 Body mass index (BMI) 29.0-29.9, adult: Secondary | ICD-10-CM | POA: Diagnosis not present

## 2022-10-12 DIAGNOSIS — S299XXA Unspecified injury of thorax, initial encounter: Secondary | ICD-10-CM | POA: Diagnosis not present

## 2022-10-12 DIAGNOSIS — S39011A Strain of muscle, fascia and tendon of abdomen, initial encounter: Secondary | ICD-10-CM | POA: Diagnosis not present

## 2022-10-21 DIAGNOSIS — E039 Hypothyroidism, unspecified: Secondary | ICD-10-CM | POA: Diagnosis not present

## 2022-10-22 DIAGNOSIS — E039 Hypothyroidism, unspecified: Secondary | ICD-10-CM | POA: Diagnosis not present

## 2022-10-24 DIAGNOSIS — L570 Actinic keratosis: Secondary | ICD-10-CM | POA: Diagnosis not present

## 2022-12-23 DIAGNOSIS — Z6829 Body mass index (BMI) 29.0-29.9, adult: Secondary | ICD-10-CM | POA: Diagnosis not present

## 2022-12-23 DIAGNOSIS — M25552 Pain in left hip: Secondary | ICD-10-CM | POA: Diagnosis not present

## 2022-12-23 DIAGNOSIS — M545 Low back pain, unspecified: Secondary | ICD-10-CM | POA: Diagnosis not present

## 2022-12-29 ENCOUNTER — Ambulatory Visit: Payer: Medicare HMO | Admitting: Urology

## 2022-12-31 DIAGNOSIS — E039 Hypothyroidism, unspecified: Secondary | ICD-10-CM | POA: Diagnosis not present

## 2022-12-31 DIAGNOSIS — E7849 Other hyperlipidemia: Secondary | ICD-10-CM | POA: Diagnosis not present

## 2022-12-31 DIAGNOSIS — R7301 Impaired fasting glucose: Secondary | ICD-10-CM | POA: Diagnosis not present

## 2022-12-31 DIAGNOSIS — G40309 Generalized idiopathic epilepsy and epileptic syndromes, not intractable, without status epilepticus: Secondary | ICD-10-CM | POA: Diagnosis not present

## 2023-01-06 DIAGNOSIS — F331 Major depressive disorder, recurrent, moderate: Secondary | ICD-10-CM | POA: Diagnosis not present

## 2023-01-06 DIAGNOSIS — I700 Atherosclerosis of aorta: Secondary | ICD-10-CM | POA: Diagnosis not present

## 2023-01-06 DIAGNOSIS — D500 Iron deficiency anemia secondary to blood loss (chronic): Secondary | ICD-10-CM | POA: Diagnosis not present

## 2023-01-06 DIAGNOSIS — I10 Essential (primary) hypertension: Secondary | ICD-10-CM | POA: Diagnosis not present

## 2023-01-06 DIAGNOSIS — E7849 Other hyperlipidemia: Secondary | ICD-10-CM | POA: Diagnosis not present

## 2023-01-06 DIAGNOSIS — I509 Heart failure, unspecified: Secondary | ICD-10-CM | POA: Diagnosis not present

## 2023-01-06 DIAGNOSIS — G40309 Generalized idiopathic epilepsy and epileptic syndromes, not intractable, without status epilepticus: Secondary | ICD-10-CM | POA: Diagnosis not present

## 2023-01-06 DIAGNOSIS — D692 Other nonthrombocytopenic purpura: Secondary | ICD-10-CM | POA: Diagnosis not present

## 2023-01-06 DIAGNOSIS — K5901 Slow transit constipation: Secondary | ICD-10-CM | POA: Diagnosis not present

## 2023-01-06 DIAGNOSIS — G8114 Spastic hemiplegia affecting left nondominant side: Secondary | ICD-10-CM | POA: Diagnosis not present

## 2023-02-06 DIAGNOSIS — L089 Local infection of the skin and subcutaneous tissue, unspecified: Secondary | ICD-10-CM | POA: Diagnosis not present

## 2023-02-06 DIAGNOSIS — S51852A Open bite of left forearm, initial encounter: Secondary | ICD-10-CM | POA: Diagnosis not present

## 2023-02-06 DIAGNOSIS — Z23 Encounter for immunization: Secondary | ICD-10-CM | POA: Diagnosis not present

## 2023-02-06 DIAGNOSIS — Z6829 Body mass index (BMI) 29.0-29.9, adult: Secondary | ICD-10-CM | POA: Diagnosis not present

## 2023-02-08 DIAGNOSIS — R030 Elevated blood-pressure reading, without diagnosis of hypertension: Secondary | ICD-10-CM | POA: Diagnosis not present

## 2023-02-08 DIAGNOSIS — Z6829 Body mass index (BMI) 29.0-29.9, adult: Secondary | ICD-10-CM | POA: Diagnosis not present

## 2023-02-08 DIAGNOSIS — L03114 Cellulitis of left upper limb: Secondary | ICD-10-CM | POA: Diagnosis not present

## 2023-02-23 ENCOUNTER — Encounter: Payer: Self-pay | Admitting: Urology

## 2023-02-23 ENCOUNTER — Ambulatory Visit (INDEPENDENT_AMBULATORY_CARE_PROVIDER_SITE_OTHER): Payer: Medicare HMO | Admitting: Urology

## 2023-02-23 VITALS — BP 146/74 | HR 101

## 2023-02-23 DIAGNOSIS — R109 Unspecified abdominal pain: Secondary | ICD-10-CM

## 2023-02-23 DIAGNOSIS — N390 Urinary tract infection, site not specified: Secondary | ICD-10-CM

## 2023-02-23 DIAGNOSIS — R339 Retention of urine, unspecified: Secondary | ICD-10-CM

## 2023-02-23 DIAGNOSIS — R8281 Pyuria: Secondary | ICD-10-CM

## 2023-02-23 DIAGNOSIS — Z87448 Personal history of other diseases of urinary system: Secondary | ICD-10-CM

## 2023-02-23 DIAGNOSIS — R300 Dysuria: Secondary | ICD-10-CM

## 2023-02-23 DIAGNOSIS — J449 Chronic obstructive pulmonary disease, unspecified: Secondary | ICD-10-CM | POA: Diagnosis not present

## 2023-02-23 DIAGNOSIS — N320 Bladder-neck obstruction: Secondary | ICD-10-CM | POA: Diagnosis not present

## 2023-02-23 LAB — BLADDER SCAN AMB NON-IMAGING: Scan Result: >130

## 2023-02-23 NOTE — Progress Notes (Signed)
History of Present Illness: Jimmy Franklin is here today for follow-up.  He underwent TURP in 2014.  That was utilized using laser.  He had incision of a bladder neck contracture in 2016.  Following that he had recurrence and inadequate bladder emptying.  He was catheter dependent for some time.  In late summer, 2023 he decided he did not want a catheter.  He was followed carefully thereafter and he had appropriate bladder emptying.  6.18.2024: Here today for recheck.  He empties a fair amount of urine into his diaper.  He does have a strong stream at times.  He does not have gross hematuria or dysuria.  Urine is intermittently cloudy.  He has not been treated for any infections over the past few months.  Past Medical History:  Diagnosis Date   Bowel habit changes 03/21/2011   Chronic constipation 08   COPD (chronic obstructive pulmonary disease) (HCC)    Depression    Enlarged prostate    Foley catheter in place 03-17-13   GERD (gastroesophageal reflux disease)    Hearing loss of both ears    Hepatitis    20 yrs ago, states he turned yellow    Hx of colonic polyps    Mixed hyperlipidemia    Polymyalgia rheumatica (HCC)    Seizures (HCC)    caused by UTI-none recent- over  2 yr   Stroke [facility])    CVA- 2013, left sided weakness, mild residual   Subarachnoid hemorrhage (HCC)    Unspecified essential hypertension     Past Surgical History:  Procedure Laterality Date   BIOPSY  07/08/2017   Procedure: BIOPSY;  Surgeon: Corbin Ade, MD;  [facility];  Service: Endoscopy;;  gastric   CATARACT EXTRACTION, BILATERAL     CHOLECYSTECTOMY     lap. gallbladder removal   COLONOSCOPY  07/04/07   ESOPHAGOGASTRODUODENOSCOPY (EGD) WITH PROPOFOL N/A 07/08/2017   Procedure: ESOPHAGOGASTRODUODENOSCOPY (EGD) WITH PROPOFOL;  Surgeon: Corbin Ade, MD;  [facility];  Service: Endoscopy;  Laterality: N/A;  7:30am   MALONEY DILATION N/A 07/08/2017   Procedure: Elease Hashimoto DILATION;   Surgeon: Corbin Ade, MD;  [facility];  Service: Endoscopy;  Laterality: N/A;   TONSILLECTOMY     TRANSURETHRAL INCISION OF BLADDER NECK N/A 01/24/2015   Procedure: TRANSURETHRAL INCISION OF BLADDER NECK;  Surgeon: Marcine Matar, MD;  [facility]: [facility];  Service: Urology;  Laterality: N/A;  **TRANSURETHRAL RESECTION BLADDER NECK CONTRACTURE**      TRANSURETHRAL RESECTION OF PROSTATE N/A 03/23/2013   Procedure: TRANSURETHRAL RESECTION OF THE PROSTATE (TURP);  Surgeon: Marcine Matar, MD;  [facility]: [facility];  Service: Urology;  Laterality: N/A;    Home Medications:  Allergies as of 02/23/2023   No Known Allergies      Medication List        Accurate as of February 23, 2023  2:52 PM. If you have any questions, ask your nurse or doctor.          amoxicillin-clavulanate 875-125 MG tablet Commonly known as: AUGMENTIN Take 1 tablet by mouth every 12 (twelve) hours.   buPROPion 150 MG 24 hr tablet Commonly known as: WELLBUTRIN XL   clonazePAM 0.5 MG tablet Commonly known as: KLONOPIN Take 0.5 mg by mouth at bedtime.   clopidogrel 75 MG tablet Commonly known as: PLAVIX Take 75 mg by mouth daily.   escitalopram 20 MG tablet Commonly known as: LEXAPRO Take 20 mg by mouth daily.   furosemide 20  MG tablet Commonly known as: LASIX Take 20 mg by mouth every morning.   liothyronine 25 MCG tablet Commonly known as: CYTOMEL Take 25 mcg by mouth daily.   lisinopril 20 MG tablet Commonly known as: ZESTRIL Take 20 mg by mouth every morning.   lisinopril 10 MG tablet Commonly known as: ZESTRIL Take 10 mg by mouth every morning.   metoprolol succinate 50 MG 24 hr tablet Commonly known as: TOPROL-XL Take 50 mg by mouth 2 (two) times daily. Take with or immediately following a meal.   pantoprazole 40 MG tablet Commonly known as: PROTONIX TAKE 1 TABLET BY MOUTH ONCE DAILY.   phenytoin 100 MG ER capsule Commonly known as: DILANTIN Take 200 mg by mouth 2  (two) times daily.   ranitidine 300 MG tablet Commonly known as: ZANTAC Take 300 mg by mouth daily.   sertraline 100 MG tablet Commonly known as: ZOLOFT   simvastatin 40 MG tablet Commonly known as: ZOCOR Take 40 mg by mouth daily.   sulfamethoxazole-trimethoprim 800-160 MG tablet Commonly known as: BACTRIM DS Take 1 tablet by mouth every 12 (twelve) hours.        Allergies: No Known Allergies  Family History  Problem Relation Age of Onset   Colon cancer Mother    Colon cancer Father     Social History:  reports that he quit smoking about 55 years ago. His smoking use included cigarettes. He has a 45.00 pack-year smoking history. He has quit using smokeless tobacco. He reports that he does not drink alcohol and does not use drugs.  ROS: A complete review of systems was performed.  All systems are negative except for pertinent findings as noted.  Physical Exam:  Vital signs in last 24 hours: BP (!) 146/74   Pulse (!) 101  Constitutional:  Alert and oriented, No acute distress Cardiovascular: Regular rate  Respiratory: Normal respiratory effort Neurologic: Grossly intact, no focal deficits Psychiatric: Normal mood and affect  I have reviewed prior pt notes  I have reviewed urinalysis results  I have independently reviewed prior imaging--CT from 2021  I have reviewed prior urine culture--last positive culture was E. coli in August, 2021   Impression/Assessment:  History of BPH, status post TURP 2014/TUR [facility] 2016.  Long term urinary retention, now resolved with the patient tolerating no catheter.  He does carry modest residual urine  Pyuria  Plan:  1.  I will culture the urine  2.  No antibiotics given at the present time.  If significant pathogen we will send an antibiotic  3.  I will have him come back in about 6 months to recheck

## 2023-02-24 LAB — URINALYSIS, ROUTINE W REFLEX MICROSCOPIC
Bilirubin, UA: NEGATIVE
Glucose, UA: NEGATIVE
Ketones, UA: NEGATIVE
Nitrite, UA: NEGATIVE
Specific Gravity, UA: 1.02 (ref 1.005–1.030)
Urobilinogen, Ur: 0.2 mg/dL (ref 0.2–1.0)
pH, UA: 6.5 (ref 5.0–7.5)

## 2023-02-24 LAB — MICROSCOPIC EXAMINATION
Epithelial Cells (non renal): >10 /hpf — AB (ref 0–10)
WBC, UA: >30 /hpf — AB (ref 0–5)

## 2023-02-25 LAB — URINE CULTURE

## 2023-03-08 ENCOUNTER — Telehealth: Payer: Self-pay

## 2023-03-08 NOTE — Telephone Encounter (Signed)
Patient gave permission to speak with granddaughter Joni Reining. Joni Reining made aware of MD recommendation. Joni Reining voiced understanding.

## 2023-03-08 NOTE — Telephone Encounter (Signed)
-----   Message from Marcine Matar, MD sent at 03/05/2023  6:36 AM EDT ----- PLease let pt know that no specific bacteria grew from urine--no abx needed ----- Message ----- From: Troy Sine, CMA Sent: 02/25/2023   5:40 PM EDT To: Marcine Matar, MD  Please review

## 2023-04-06 DIAGNOSIS — L0390 Cellulitis, unspecified: Secondary | ICD-10-CM | POA: Diagnosis not present

## 2023-04-06 DIAGNOSIS — S81802A Unspecified open wound, left lower leg, initial encounter: Secondary | ICD-10-CM | POA: Diagnosis not present

## 2023-04-06 DIAGNOSIS — Z6830 Body mass index (BMI) 30.0-30.9, adult: Secondary | ICD-10-CM | POA: Diagnosis not present

## 2023-05-11 DIAGNOSIS — E039 Hypothyroidism, unspecified: Secondary | ICD-10-CM | POA: Diagnosis not present

## 2023-05-11 DIAGNOSIS — E7849 Other hyperlipidemia: Secondary | ICD-10-CM | POA: Diagnosis not present

## 2023-05-11 DIAGNOSIS — Z23 Encounter for immunization: Secondary | ICD-10-CM | POA: Diagnosis not present

## 2023-05-11 DIAGNOSIS — G40309 Generalized idiopathic epilepsy and epileptic syndromes, not intractable, without status epilepticus: Secondary | ICD-10-CM | POA: Diagnosis not present

## 2023-05-11 DIAGNOSIS — D500 Iron deficiency anemia secondary to blood loss (chronic): Secondary | ICD-10-CM | POA: Diagnosis not present

## 2023-05-11 DIAGNOSIS — I700 Atherosclerosis of aorta: Secondary | ICD-10-CM | POA: Diagnosis not present

## 2023-05-11 DIAGNOSIS — D692 Other nonthrombocytopenic purpura: Secondary | ICD-10-CM | POA: Diagnosis not present

## 2023-05-11 DIAGNOSIS — E1165 Type 2 diabetes mellitus with hyperglycemia: Secondary | ICD-10-CM | POA: Diagnosis not present

## 2023-05-11 DIAGNOSIS — I10 Essential (primary) hypertension: Secondary | ICD-10-CM | POA: Diagnosis not present

## 2023-06-24 DIAGNOSIS — E039 Hypothyroidism, unspecified: Secondary | ICD-10-CM | POA: Diagnosis not present

## 2023-07-08 DIAGNOSIS — F331 Major depressive disorder, recurrent, moderate: Secondary | ICD-10-CM | POA: Diagnosis not present

## 2023-07-08 DIAGNOSIS — E782 Mixed hyperlipidemia: Secondary | ICD-10-CM | POA: Diagnosis not present

## 2023-07-08 DIAGNOSIS — E1122 Type 2 diabetes mellitus with diabetic chronic kidney disease: Secondary | ICD-10-CM | POA: Diagnosis not present

## 2023-07-08 DIAGNOSIS — E039 Hypothyroidism, unspecified: Secondary | ICD-10-CM | POA: Diagnosis not present

## 2023-08-24 ENCOUNTER — Ambulatory Visit: Payer: Medicare HMO | Admitting: Urology

## 2023-08-24 DIAGNOSIS — R339 Retention of urine, unspecified: Secondary | ICD-10-CM

## 2023-08-24 DIAGNOSIS — N320 Bladder-neck obstruction: Secondary | ICD-10-CM

## 2023-09-09 DIAGNOSIS — I10 Essential (primary) hypertension: Secondary | ICD-10-CM | POA: Diagnosis not present

## 2023-09-09 DIAGNOSIS — G40909 Epilepsy, unspecified, not intractable, without status epilepticus: Secondary | ICD-10-CM | POA: Diagnosis not present

## 2023-09-09 DIAGNOSIS — E559 Vitamin D deficiency, unspecified: Secondary | ICD-10-CM | POA: Diagnosis not present

## 2023-09-09 DIAGNOSIS — E7849 Other hyperlipidemia: Secondary | ICD-10-CM | POA: Diagnosis not present

## 2023-09-09 DIAGNOSIS — E782 Mixed hyperlipidemia: Secondary | ICD-10-CM | POA: Diagnosis not present

## 2023-09-09 DIAGNOSIS — E1165 Type 2 diabetes mellitus with hyperglycemia: Secondary | ICD-10-CM | POA: Diagnosis not present

## 2023-09-09 DIAGNOSIS — E876 Hypokalemia: Secondary | ICD-10-CM | POA: Diagnosis not present

## 2023-09-09 DIAGNOSIS — E039 Hypothyroidism, unspecified: Secondary | ICD-10-CM | POA: Diagnosis not present

## 2023-09-09 DIAGNOSIS — E1122 Type 2 diabetes mellitus with diabetic chronic kidney disease: Secondary | ICD-10-CM | POA: Diagnosis not present

## 2023-09-11 DIAGNOSIS — D509 Iron deficiency anemia, unspecified: Secondary | ICD-10-CM | POA: Diagnosis not present

## 2023-09-13 DIAGNOSIS — D519 Vitamin B12 deficiency anemia, unspecified: Secondary | ICD-10-CM | POA: Diagnosis not present

## 2023-09-13 DIAGNOSIS — D649 Anemia, unspecified: Secondary | ICD-10-CM | POA: Diagnosis not present

## 2023-09-13 DIAGNOSIS — D529 Folate deficiency anemia, unspecified: Secondary | ICD-10-CM | POA: Diagnosis not present

## 2023-09-14 DIAGNOSIS — G40309 Generalized idiopathic epilepsy and epileptic syndromes, not intractable, without status epilepticus: Secondary | ICD-10-CM | POA: Diagnosis not present

## 2023-09-14 DIAGNOSIS — F331 Major depressive disorder, recurrent, moderate: Secondary | ICD-10-CM | POA: Diagnosis not present

## 2023-09-14 DIAGNOSIS — G8114 Spastic hemiplegia affecting left nondominant side: Secondary | ICD-10-CM | POA: Diagnosis not present

## 2023-09-14 DIAGNOSIS — Z0001 Encounter for general adult medical examination with abnormal findings: Secondary | ICD-10-CM | POA: Diagnosis not present

## 2023-09-14 DIAGNOSIS — E7849 Other hyperlipidemia: Secondary | ICD-10-CM | POA: Diagnosis not present

## 2023-09-14 DIAGNOSIS — D692 Other nonthrombocytopenic purpura: Secondary | ICD-10-CM | POA: Diagnosis not present

## 2023-09-14 DIAGNOSIS — D500 Iron deficiency anemia secondary to blood loss (chronic): Secondary | ICD-10-CM | POA: Diagnosis not present

## 2023-09-14 DIAGNOSIS — I10 Essential (primary) hypertension: Secondary | ICD-10-CM | POA: Diagnosis not present

## 2023-09-14 DIAGNOSIS — K5901 Slow transit constipation: Secondary | ICD-10-CM | POA: Diagnosis not present

## 2023-09-17 DIAGNOSIS — D509 Iron deficiency anemia, unspecified: Secondary | ICD-10-CM | POA: Diagnosis not present

## 2023-09-21 ENCOUNTER — Encounter: Payer: Self-pay | Admitting: Internal Medicine

## 2023-09-22 DIAGNOSIS — D23122 Other benign neoplasm of skin of left lower eyelid, including canthus: Secondary | ICD-10-CM | POA: Diagnosis not present

## 2023-09-22 DIAGNOSIS — H52223 Regular astigmatism, bilateral: Secondary | ICD-10-CM | POA: Diagnosis not present

## 2023-09-22 DIAGNOSIS — H5203 Hypermetropia, bilateral: Secondary | ICD-10-CM | POA: Diagnosis not present

## 2023-09-22 DIAGNOSIS — Z961 Presence of intraocular lens: Secondary | ICD-10-CM | POA: Diagnosis not present

## 2023-09-22 DIAGNOSIS — D23112 Other benign neoplasm of skin of right lower eyelid, including canthus: Secondary | ICD-10-CM | POA: Diagnosis not present

## 2023-09-22 DIAGNOSIS — D23111 Other benign neoplasm of skin of right upper eyelid, including canthus: Secondary | ICD-10-CM | POA: Diagnosis not present

## 2023-09-22 DIAGNOSIS — H524 Presbyopia: Secondary | ICD-10-CM | POA: Diagnosis not present

## 2023-09-24 DIAGNOSIS — D509 Iron deficiency anemia, unspecified: Secondary | ICD-10-CM | POA: Diagnosis not present

## 2023-10-05 NOTE — Progress Notes (Unsigned)
GI Office Note    Referring Provider: Richardean Chimera, MD Primary Care Physician:  Richardean Chimera, MD  Primary Gastroenterologist:  Chief Complaint   No chief complaint on file.    History of Present Illness   Jimmy Franklin is a 89 y.o. male presenting today for iron deficiency anemia.  Receiving IV iron infusions.    Stroke, hyperlipidemia, hypertension, seizure, anemia of chronic disease, CHF, depression, peripheral vascular disease, cardiac stent 2016, cholecystectomy 2005, T TURP 2011, mother had colon cancer    Labs from September 13, 2023: B12 360, folate 4.2, ferritin 6, TIBC 379, serum iron 13, iron sat 3%,  Labs from?  Hemoglobin 6.5, hematocrit 22.1, MCV 57.3, albumin 3.7, AST 11, ALT 7, alk phos 64, total bilirubin 0.4, creatinine 1.14   Egd 07/2017: -Ulcerative reflux esophagitis -Schatzki ring status post dilation -Edematous eroded gastric mucosa in the stomach status post biopsy no evidence of infection or tumor. -Normal duodenal bulb and second portion duodenum  Colonoscopy in 2008: Op note not available but pathology available showing at least 4 tubular adenomas removed, additional descending colon biopsy with adenomatous changes.  Medications   Current Outpatient Medications  Medication Sig Dispense Refill   amoxicillin-clavulanate (AUGMENTIN) 875-125 MG tablet Take 1 tablet by mouth every 12 (twelve) hours. (Patient not taking: Reported on 02/23/2023) 14 tablet 0   buPROPion (WELLBUTRIN XL) 150 MG 24 hr tablet      clonazePAM (KLONOPIN) 0.5 MG tablet Take 0.5 mg by mouth at bedtime.      clopidogrel (PLAVIX) 75 MG tablet Take 75 mg by mouth daily.     escitalopram (LEXAPRO) 20 MG tablet Take 20 mg by mouth daily.     furosemide (LASIX) 20 MG tablet Take 20 mg by mouth every morning.      liothyronine (CYTOMEL) 25 MCG tablet Take 25 mcg by mouth daily.     lisinopril (PRINIVIL,ZESTRIL) 20 MG tablet Take 20 mg by mouth every morning.       lisinopril (ZESTRIL) 10 MG tablet Take 10 mg by mouth every morning.     metoprolol succinate (TOPROL-XL) 50 MG 24 hr tablet Take 50 mg by mouth 2 (two) times daily. Take with or immediately following a meal.     pantoprazole (PROTONIX) 40 MG tablet TAKE 1 TABLET BY MOUTH ONCE DAILY. 90 tablet 2   phenytoin (DILANTIN) 100 MG ER capsule Take 200 mg by mouth 2 (two) times daily.      ranitidine (ZANTAC) 300 MG tablet Take 300 mg by mouth daily.      sertraline (ZOLOFT) 100 MG tablet      simvastatin (ZOCOR) 40 MG tablet Take 40 mg by mouth daily.      sulfamethoxazole-trimethoprim (BACTRIM DS) 800-160 MG tablet Take 1 tablet by mouth every 12 (twelve) hours. (Patient not taking: Reported on 02/23/2023) 20 tablet 0   No current facility-administered medications for this visit.    Allergies   Allergies as of 10/06/2023   (No Known Allergies)    Past Medical History   Past Medical History:  Diagnosis Date   Bowel habit changes 03/21/2011   Chronic constipation 08   COPD (chronic obstructive pulmonary disease) (HCC)    Depression    Enlarged prostate    Foley catheter in place 03-17-13   GERD (gastroesophageal reflux disease)    Hearing loss of both ears    Hepatitis    20 yrs ago, states he turned yellow    Hx  of colonic polyps    Mixed hyperlipidemia    Polymyalgia rheumatica (HCC)    Seizures (HCC)    caused by UTI-none recent- over  2 yr   Stroke [facility])    CVA- 2013, left sided weakness, mild residual   Subarachnoid hemorrhage (HCC)    Unspecified essential hypertension     Past Surgical History   Past Surgical History:  Procedure Laterality Date   BIOPSY  07/08/2017   Procedure: BIOPSY;  Surgeon: Corbin Ade, MD;  [facility];  Service: Endoscopy;;  gastric   CATARACT EXTRACTION, BILATERAL     CHOLECYSTECTOMY     lap. gallbladder removal   COLONOSCOPY  07/04/07   ESOPHAGOGASTRODUODENOSCOPY (EGD) WITH PROPOFOL N/A 07/08/2017   Procedure:  ESOPHAGOGASTRODUODENOSCOPY (EGD) WITH PROPOFOL;  Surgeon: Corbin Ade, MD;  [facility];  Service: Endoscopy;  Laterality: N/A;  7:30am   MALONEY DILATION N/A 07/08/2017   Procedure: Elease Hashimoto DILATION;  Surgeon: Corbin Ade, MD;  [facility];  Service: Endoscopy;  Laterality: N/A;   TONSILLECTOMY     TRANSURETHRAL INCISION OF BLADDER NECK N/A 01/24/2015   Procedure: TRANSURETHRAL INCISION OF BLADDER NECK;  Surgeon: Marcine Matar, MD;  [facility];  Service: Urology;  Laterality: N/A;  **TRANSURETHRAL RESECTION BLADDER NECK CONTRACTURE**      TRANSURETHRAL RESECTION OF PROSTATE N/A 03/23/2013   Procedure: TRANSURETHRAL RESECTION OF THE PROSTATE (TURP);  Surgeon: Marcine Matar, MD;  [facility];  Service: Urology;  Laterality: N/A;    Past Family History   Family History  Problem Relation Age of Onset   Colon cancer Mother    Colon cancer Father     Past Social History   Social History   Socioeconomic History   Marital status: Married    Spouse name: Not on file   Number of children: Not on file   Years of education: Not on file   Highest education level: Not on file  Occupational History   Not on file  Tobacco Use   Smoking status: Former    Current packs/day: 0.00    Average packs/day: 3.0 packs/day for 15.0 years (45.0 ttl pk-yrs)    Types: Cigarettes    Start date: 03/17/1952    Quit date: 03/18/1967    Years since quitting: 56.5   Smokeless tobacco: Former  Building services engineer status: Never Used  Substance and Sexual Activity   Alcohol use: No   Drug use: No   Sexual activity: Not Currently  Other Topics Concern   Not on file  Social History Narrative   Not on file   Social Drivers of Health   Financial Resource Strain: Low Risk  (05/29/2022)   Received from [facility], [facility]   Overall Financial Resource Strain (CARDIA)    Difficulty of Paying Living Expenses: Not very hard  Food Insecurity: No Food  Insecurity (06/17/2022)   Received from [facility], [facility]   Hunger Vital Sign    Worried About Running Out of Food in the Last Year: Never true    Ran Out of Food in the Last Year: Never true  Transportation Needs: No Transportation Needs (05/31/2022)   Received from [facility], [facility]   PRAPARE - Transportation    Lack of Transportation (Medical): No    Lack of Transportation (Non-Medical): No  Physical Activity: Not on file  Stress: Stress Concern Present (05/28/2022)   Received from [facility], [facility]   [facility]  of Occupational Health - Occupational Stress Questionnaire    Feeling of Stress : To some extent  Social Connections: Unknown (05/28/2022)   Received from [facility], [facility]   Social Network    Social Network: Not on file  Intimate Partner Violence: Unknown (05/28/2022)   Received from [facility], [facility]   HITS    Physically Hurt: Not on file    Insult or Talk Down To: Not on file    Threaten Physical Harm: Not on file    Scream or Curse: Not on file    Review of Systems   General: Negative for anorexia, weight loss, fever, chills, fatigue, weakness. Eyes: Negative for vision changes.  ENT: Negative for hoarseness, difficulty swallowing , nasal congestion. CV: Negative for chest pain, angina, palpitations, dyspnea on exertion, peripheral edema.  Respiratory: Negative for dyspnea at rest, dyspnea on exertion, cough, sputum, wheezing.  GI: See history of present illness. GU:  Negative for dysuria, hematuria, urinary incontinence, urinary frequency, nocturnal urination.  MS: Negative for joint pain, low back pain.  Derm: Negative for rash or itching.  Neuro: Negative for weakness, abnormal sensation, seizure, frequent headaches, memory loss,  confusion.  Psych: Negative for anxiety, depression, suicidal ideation, hallucinations.  Endo: Negative for unusual weight change.  Heme: Negative for bruising or  bleeding. Allergy: Negative for rash or hives.  Physical Exam   There were no vitals taken for this visit.   General: Well-nourished, well-developed in no acute distress.  Head: Normocephalic, atraumatic.   Eyes: Conjunctiva pink, no icterus. Mouth: Oropharyngeal mucosa moist and pink , no lesions erythema or exudate. Neck: Supple without thyromegaly, masses, or lymphadenopathy.  Lungs: Clear to auscultation bilaterally.  Heart: Regular rate and rhythm, no murmurs rubs or gallops.  Abdomen: Bowel sounds are normal, nontender, nondistended, no hepatosplenomegaly or masses,  no abdominal bruits or hernia, no rebound or guarding.   Rectal: *** Extremities: No lower extremity edema. No clubbing or deformities.  Neuro: Alert and oriented x 4 , grossly normal neurologically.  Skin: Warm and dry, no rash or jaundice.   Psych: Alert and cooperative, normal mood and affect.  Labs   *** Imaging Studies   No results found.  Assessment       PLAN   ***   Leanna Battles. Melvyn Neth, MHS, PA-C [facility]

## 2023-10-06 ENCOUNTER — Encounter: Payer: Self-pay | Admitting: Gastroenterology

## 2023-10-06 ENCOUNTER — Other Ambulatory Visit (HOSPITAL_COMMUNITY)
Admission: RE | Admit: 2023-10-06 | Discharge: 2023-10-06 | Disposition: A | Discharge status: Home / Self Care | Payer: Medicare HMO | Source: Ambulatory Visit | Attending: Gastroenterology | Admitting: Gastroenterology

## 2023-10-06 ENCOUNTER — Ambulatory Visit (INDEPENDENT_AMBULATORY_CARE_PROVIDER_SITE_OTHER): Payer: Medicare HMO | Admitting: Gastroenterology

## 2023-10-06 ENCOUNTER — Telehealth: Payer: Self-pay

## 2023-10-06 VITALS — BP 145/80 | HR 82 | Temp 97.8°F | Ht 74.0 in | Wt 199.6 lb

## 2023-10-06 DIAGNOSIS — D509 Iron deficiency anemia, unspecified: Secondary | ICD-10-CM | POA: Diagnosis not present

## 2023-10-06 DIAGNOSIS — K921 Melena: Secondary | ICD-10-CM | POA: Insufficient documentation

## 2023-10-06 LAB — CBC WITH DIFFERENTIAL/PLATELET
Abs Immature Granulocytes: 0.03 10*3/uL (ref 0.00–0.07)
Basophils Absolute: 0.1 10*3/uL (ref 0.0–0.1)
Basophils Relative: 1 %
Eosinophils Absolute: 0.2 10*3/uL (ref 0.0–0.5)
Eosinophils Relative: 3 %
HCT: 37.9 % — ABNORMAL LOW (ref 39.0–52.0)
Hemoglobin: 10.2 g/dL — ABNORMAL LOW (ref 13.0–17.0)
Immature Granulocytes: 0 %
Lymphocytes Relative: 14 %
Lymphs Abs: 1 10*3/uL (ref 0.7–4.0)
MCH: 20.9 pg — ABNORMAL LOW (ref 26.0–34.0)
MCHC: 26.9 g/dL — ABNORMAL LOW (ref 30.0–36.0)
MCV: 77.7 fL — ABNORMAL LOW (ref 80.0–100.0)
Monocytes Absolute: 0.8 10*3/uL (ref 0.1–1.0)
Monocytes Relative: 11 %
Neutro Abs: 5 10*3/uL (ref 1.7–7.7)
Neutrophils Relative %: 71 %
Platelets: 221 10*3/uL (ref 150–400)
RBC: 4.88 MIL/uL (ref 4.22–5.81)
WBC: 7 10*3/uL (ref 4.0–10.5)
nRBC: 0 % (ref 0.0–0.2)

## 2023-10-06 LAB — IRON AND TIBC
Iron: 28 ug/dL — ABNORMAL LOW (ref 45–182)
Saturation Ratios: 8 % — ABNORMAL LOW (ref 17.9–39.5)
TIBC: 360 ug/dL (ref 250–450)
UIBC: 332 ug/dL

## 2023-10-06 LAB — FERRITIN: Ferritin: 67 ng/mL (ref 24–336)

## 2023-10-06 NOTE — Patient Instructions (Signed)
Please have labs done today at [facility]. We will be in touch with results. You may need a blood transfusion, we will keep you posted. Unfortunately to get a blood transfusion, patient now have to go to the ER for this, it cannot be done through outpatient clinic. This is a recent change. Sorry for any confusion.  We will get you scheduled for a colonoscopy and upper endoscopy once we get your labs back.  If you see further black stools, please go to the ER at [facility] .   Continue pantoprazole 40mg  daily before breakfast.

## 2023-10-06 NOTE — Telephone Encounter (Signed)
noted

## 2023-10-06 NOTE — Telephone Encounter (Signed)
Pt's granddaughter called back and updated rx list.

## 2023-10-06 NOTE — Addendum Note (Signed)
Addended by: Zada Finders on: 10/06/2023 11:59 AM   Modules accepted: Orders

## 2023-10-07 ENCOUNTER — Other Ambulatory Visit: Payer: Self-pay | Admitting: *Deleted

## 2023-10-07 ENCOUNTER — Encounter: Payer: Self-pay | Admitting: *Deleted

## 2023-10-07 MED ORDER — PEG 3350-KCL-NA BICARB-NACL 420 G PO SOLR: 4000.0000 mL | Freq: Once | ORAL | 0 refills | Status: AC

## 2023-11-05 DIAGNOSIS — E039 Hypothyroidism, unspecified: Secondary | ICD-10-CM | POA: Diagnosis not present

## 2023-11-05 DIAGNOSIS — D509 Iron deficiency anemia, unspecified: Secondary | ICD-10-CM | POA: Diagnosis not present

## 2023-11-05 DIAGNOSIS — E1122 Type 2 diabetes mellitus with diabetic chronic kidney disease: Secondary | ICD-10-CM | POA: Diagnosis not present

## 2023-11-05 DIAGNOSIS — E782 Mixed hyperlipidemia: Secondary | ICD-10-CM | POA: Diagnosis not present

## 2023-11-11 NOTE — Telephone Encounter (Signed)
 [facility] PA for TCS/EGD: [facility] PA: Approved Authorization #[id number]  Tracking #[id number] Dates of service 11/17/2023 - 02/16/2024

## 2023-11-12 NOTE — Patient Instructions (Signed)
 Jimmy Franklin  11/12/2023     @PREFPERIOPPHARMACY @   Your procedure is scheduled on  11/17/2023.   Report to [facility] at  0900  A.M.   Call this number if you have problems the morning of surgery:  [phone]  If you experience any cold or flu symptoms such as cough, fever, chills, shortness of breath, etc. between now and your scheduled surgery, please notify us at the above number.   Remember:  Follow the diet and prep instructions given to you by the office.   You may drink clear liquids until 0700 am on 11/17/2023.    Clear liquids allowed are:                    Water, Juice (No red color; non-citric and without pulp; diabetics please choose diet or no sugar options), Carbonated beverages (diabetics please choose diet or no sugar options), Clear Tea (No creamer, milk, or cream, including half & half and powdered creamer), Black Coffee Only (No creamer, milk or cream, including half & half and powdered creamer), and Clear Sports drink (No red color; diabetics please choose diet or no sugar options)    Take these medicines the morning of surgery with A SIP OF WATER       levothyroxine, pantoprazole, phenytoin, sertraline.    Do not wear jewelry, make-up or nail polish, including gel polish,  artificial nails, or any other type of covering on natural nails (fingers and  toes).  Do not wear lotions, powders, or perfumes, or deodorant.  Do not shave 48 hours prior to surgery.  Men may shave face and neck.  Do not bring valuables to the hospital.  [facility] is not responsible for any belongings or valuables.  Contacts, dentures or bridgework may not be worn into surgery.  Leave your suitcase in the car.  After surgery it may be brought to your room.  For patients admitted to the hospital, discharge time will be determined by your treatment team.  Patients discharged the day of surgery will not be allowed to drive home and must have someone with them for 24  hours.    Special instructions:   DO NOT smoke tobacco or vape for 24 hours before your procedure.  Please read over the following fact sheets that you were given. Anesthesia Post-op Instructions and Care and Recovery After Surgery      Upper Endoscopy, Adult, Care After After the procedure, it is common to have a sore throat. It is also common to have: Mild stomach pain or discomfort. Bloating. Nausea. Follow these instructions at home: The instructions below may help you care for yourself at home. Your health care provider may give you more instructions. If you have questions, ask your health care provider. If you were given a sedative during the procedure, it can affect you for several hours. Do not drive or operate machinery until your health care provider says that it is safe. If you will be going home right after the procedure, plan to have a responsible adult: Take you home from the hospital or clinic. You will not be allowed to drive. Care for you for the time you are told. Follow instructions from your health care provider about what you may eat and drink. Return to your normal activities as told by your health care provider. Ask your health care provider what activities are safe for you. Take over-the-counter and prescription medicines only  as told by your health care provider. Contact a health care provider if you: Have a sore throat that lasts longer than one day. Have trouble swallowing. Have a fever. Get help right away if you: Vomit blood or your vomit looks like coffee grounds. Have bloody, black, or tarry stools. Have a very bad sore throat or you cannot swallow. Have difficulty breathing or very bad pain in your chest or abdomen. These symptoms may be an emergency. Get help right away. Call 911. Do not wait to see if the symptoms will go away. Do not drive yourself to the hospital. Summary After the procedure, it is common to have a sore throat, mild stomach  discomfort, bloating, and nausea. If you were given a sedative during the procedure, it can affect you for several hours. Do not drive until your health care provider says that it is safe. Follow instructions from your health care provider about what you may eat and drink. Return to your normal activities as told by your health care provider. This information is not intended to replace advice given to you by your health care provider. Make sure you discuss any questions you have with your health care provider. Document Revised: 12/03/2021 Document Reviewed: 12/03/2021 Elsevier Patient Education  2024 [employer].Colonoscopy, Adult, Care After The following information offers guidance on how to care for yourself after your procedure. Your health care provider may also give you more specific instructions. If you have problems or questions, contact your health care provider. What can I expect after the procedure? After the procedure, it is common to have: A small amount of blood in your stool for 24 hours after the procedure. Some gas. Mild cramping or bloating of your abdomen. Follow these instructions at home: Eating and drinking  Drink enough fluid to keep your urine pale yellow. Follow instructions from your health care provider about eating or drinking restrictions. Resume your normal diet as told by your health care provider. Avoid heavy or fried foods that are hard to digest. Activity Rest as told by your health care provider. Avoid sitting for a long time without moving. Get up to take short walks every 1-2 hours. This is important to improve blood flow and breathing. Ask for help if you feel weak or unsteady. Return to your normal activities as told by your health care provider. Ask your health care provider what activities are safe for you. Managing cramping and bloating  Try walking around when you have cramps or feel bloated. If directed, apply heat to your abdomen as told by  your health care provider. Use the heat source that your health care provider recommends, such as a moist heat pack or a heating pad. Place a towel between your skin and the heat source. Leave the heat on for 20-30 minutes. Remove the heat if your skin turns bright red. This is especially important if you are unable to feel pain, heat, or cold. You have a greater risk of getting burned. General instructions If you were given a sedative during the procedure, it can affect you for several hours. Do not drive or operate machinery until your health care provider says that it is safe. For the first 24 hours after the procedure: Do not sign important documents. Do not drink alcohol. Do your regular daily activities at a slower pace than normal. Eat soft foods that are easy to digest. Take over-the-counter and prescription medicines only as told by your health care provider. Keep all follow-up visits. This  is important. Contact a health care provider if: You have blood in your stool 2-3 days after the procedure. Get help right away if: You have more than a small spotting of blood in your stool. You have large blood clots in your stool. You have swelling of your abdomen. You have nausea or vomiting. You have a fever. You have increasing pain in your abdomen that is not relieved with medicine. These symptoms may be an emergency. Get help right away. Call 911. Do not wait to see if the symptoms will go away. Do not drive yourself to the hospital. Summary After the procedure, it is common to have a small amount of blood in your stool. You may also have mild cramping and bloating of your abdomen. If you were given a sedative during the procedure, it can affect you for several hours. Do not drive or operate machinery until your health care provider says that it is safe. Get help right away if you have a lot of blood in your stool, nausea or vomiting, a fever, or increased pain in your abdomen. This  information is not intended to replace advice given to you by your health care provider. Make sure you discuss any questions you have with your health care provider. Document Revised: 10/06/2022 Document Reviewed: 04/16/2021 Elsevier Patient Education  2024 [employer] Inc.General Anesthesia, Adult, Care After The following information offers guidance on how to care for yourself after your procedure. Your health care provider may also give you more specific instructions. If you have problems or questions, contact your health care provider. What can I expect after the procedure? After the procedure, it is common for people to: Have pain or discomfort at the IV site. Have nausea or vomiting. Have a sore throat or hoarseness. Have trouble concentrating. Feel cold or chills. Feel weak, sleepy, or tired (fatigue). Have soreness and body aches. These can affect parts of the body that were not involved in surgery. Follow these instructions at home: For the time period you were told by your health care provider:  Rest. Do not participate in activities where you could fall or become injured. Do not drive or use machinery. Do not drink alcohol. Do not take sleeping pills or medicines that cause drowsiness. Do not make important decisions or sign legal documents. Do not take care of children on your own. General instructions Drink enough fluid to keep your urine pale yellow. If you have sleep apnea, surgery and certain medicines can increase your risk for breathing problems. Follow instructions from your health care provider about wearing your sleep device: Anytime you are sleeping, including during daytime naps. While taking prescription pain medicines, sleeping medicines, or medicines that make you drowsy. Return to your normal activities as told by your health care provider. Ask your health care provider what activities are safe for you. Take over-the-counter and prescription medicines only as  told by your health care provider. Do not use any products that contain nicotine or tobacco. These products include cigarettes, chewing tobacco, and vaping devices, such as e-cigarettes. These can delay incision healing after surgery. If you need help quitting, ask your health care provider. Contact a health care provider if: You have nausea or vomiting that does not get better with medicine. You vomit every time you eat or drink. You have pain that does not get better with medicine. You cannot urinate or have bloody urine. You develop a skin rash. You have a fever. Get help right away if: You have trouble  breathing. You have chest pain. You vomit blood. These symptoms may be an emergency. Get help right away. Call 911. Do not wait to see if the symptoms will go away. Do not drive yourself to the hospital. Summary After the procedure, it is common to have a sore throat, hoarseness, nausea, vomiting, or to feel weak, sleepy, or fatigue. For the time period you were told by your health care provider, do not drive or use machinery. Get help right away if you have difficulty breathing, have chest pain, or vomit blood. These symptoms may be an emergency. This information is not intended to replace advice given to you by your health care provider. Make sure you discuss any questions you have with your health care provider. Document Revised: 11/21/2021 Document Reviewed: 11/21/2021 Elsevier Patient Education  2024 [employer].

## 2023-11-15 ENCOUNTER — Encounter (HOSPITAL_COMMUNITY): Payer: Self-pay

## 2023-11-15 ENCOUNTER — Encounter (HOSPITAL_COMMUNITY)
Admission: RE | Admit: 2023-11-15 | Discharge: 2023-11-15 | Disposition: A | Discharge status: Home / Self Care | Payer: Medicare HMO | Source: Ambulatory Visit | Attending: Internal Medicine | Admitting: Internal Medicine

## 2023-11-15 ENCOUNTER — Other Ambulatory Visit: Payer: Self-pay

## 2023-11-15 VITALS — BP 126/69 | HR 94 | Temp 98.0°F | Resp 18 | Ht 74.0 in | Wt 199.5 lb

## 2023-11-15 DIAGNOSIS — I10 Essential (primary) hypertension: Secondary | ICD-10-CM | POA: Diagnosis not present

## 2023-11-15 DIAGNOSIS — Z01810 Encounter for preprocedural cardiovascular examination: Secondary | ICD-10-CM | POA: Insufficient documentation

## 2023-11-15 NOTE — Progress Notes (Signed)
PAT visit completed. Pt verbalized understanding on procedure instructions and arrival time.   

## 2023-11-17 ENCOUNTER — Other Ambulatory Visit: Payer: Self-pay

## 2023-11-17 ENCOUNTER — Encounter (HOSPITAL_COMMUNITY)
Admission: RE | Disposition: A | Discharge status: Home / Self Care | Payer: Self-pay | Source: Home / Self Care | Attending: Internal Medicine

## 2023-11-17 ENCOUNTER — Ambulatory Visit (HOSPITAL_COMMUNITY): Admitting: Anesthesiology

## 2023-11-17 ENCOUNTER — Encounter (HOSPITAL_COMMUNITY): Payer: Self-pay | Admitting: Internal Medicine

## 2023-11-17 ENCOUNTER — Ambulatory Visit (HOSPITAL_COMMUNITY)
Admission: RE | Admit: 2023-11-17 | Discharge: 2023-11-17 | Disposition: A | Discharge status: Home / Self Care | Payer: Medicare HMO | Attending: Internal Medicine | Admitting: Internal Medicine

## 2023-11-17 DIAGNOSIS — K2970 Gastritis, unspecified, without bleeding: Secondary | ICD-10-CM | POA: Diagnosis not present

## 2023-11-17 DIAGNOSIS — D509 Iron deficiency anemia, unspecified: Secondary | ICD-10-CM | POA: Insufficient documentation

## 2023-11-17 DIAGNOSIS — Z87891 Personal history of nicotine dependence: Secondary | ICD-10-CM | POA: Diagnosis not present

## 2023-11-17 DIAGNOSIS — I739 Peripheral vascular disease, unspecified: Secondary | ICD-10-CM | POA: Diagnosis not present

## 2023-11-17 DIAGNOSIS — I110 Hypertensive heart disease with heart failure: Secondary | ICD-10-CM | POA: Diagnosis not present

## 2023-11-17 DIAGNOSIS — D123 Benign neoplasm of transverse colon: Secondary | ICD-10-CM | POA: Diagnosis not present

## 2023-11-17 DIAGNOSIS — R195 Other fecal abnormalities: Secondary | ICD-10-CM | POA: Diagnosis present

## 2023-11-17 DIAGNOSIS — D120 Benign neoplasm of cecum: Secondary | ICD-10-CM | POA: Diagnosis not present

## 2023-11-17 DIAGNOSIS — I509 Heart failure, unspecified: Secondary | ICD-10-CM | POA: Diagnosis not present

## 2023-11-17 DIAGNOSIS — I69354 Hemiplegia and hemiparesis following cerebral infarction affecting left non-dominant side: Secondary | ICD-10-CM | POA: Insufficient documentation

## 2023-11-17 DIAGNOSIS — F32A Depression, unspecified: Secondary | ICD-10-CM | POA: Insufficient documentation

## 2023-11-17 DIAGNOSIS — Z800 Family history of malignant neoplasm of digestive organs: Secondary | ICD-10-CM | POA: Insufficient documentation

## 2023-11-17 DIAGNOSIS — K31A0 Gastric intestinal metaplasia, unspecified: Secondary | ICD-10-CM | POA: Diagnosis not present

## 2023-11-17 DIAGNOSIS — K5520 Angiodysplasia of colon without hemorrhage: Secondary | ICD-10-CM | POA: Diagnosis not present

## 2023-11-17 DIAGNOSIS — K219 Gastro-esophageal reflux disease without esophagitis: Secondary | ICD-10-CM | POA: Diagnosis not present

## 2023-11-17 DIAGNOSIS — K2950 Unspecified chronic gastritis without bleeding: Secondary | ICD-10-CM | POA: Insufficient documentation

## 2023-11-17 DIAGNOSIS — Z7902 Long term (current) use of antithrombotics/antiplatelets: Secondary | ICD-10-CM | POA: Insufficient documentation

## 2023-11-17 DIAGNOSIS — R1310 Dysphagia, unspecified: Secondary | ICD-10-CM | POA: Diagnosis not present

## 2023-11-17 DIAGNOSIS — K2951 Unspecified chronic gastritis with bleeding: Secondary | ICD-10-CM | POA: Diagnosis not present

## 2023-11-17 DIAGNOSIS — R569 Unspecified convulsions: Secondary | ICD-10-CM | POA: Diagnosis not present

## 2023-11-17 DIAGNOSIS — I2510 Atherosclerotic heart disease of native coronary artery without angina pectoris: Secondary | ICD-10-CM | POA: Insufficient documentation

## 2023-11-17 DIAGNOSIS — D122 Benign neoplasm of ascending colon: Secondary | ICD-10-CM | POA: Insufficient documentation

## 2023-11-17 DIAGNOSIS — J449 Chronic obstructive pulmonary disease, unspecified: Secondary | ICD-10-CM | POA: Diagnosis not present

## 2023-11-17 DIAGNOSIS — K635 Polyp of colon: Secondary | ICD-10-CM | POA: Diagnosis not present

## 2023-11-17 DIAGNOSIS — K6389 Other specified diseases of intestine: Secondary | ICD-10-CM

## 2023-11-17 DIAGNOSIS — K31A19 Gastric intestinal metaplasia without dysplasia, unspecified site: Secondary | ICD-10-CM

## 2023-11-17 HISTORY — PX: ESOPHAGOGASTRODUODENOSCOPY (EGD) WITH PROPOFOL: SHX5813

## 2023-11-17 HISTORY — PX: POLYPECTOMY: SHX5525

## 2023-11-17 HISTORY — PX: COLONOSCOPY WITH PROPOFOL: SHX5780

## 2023-11-17 SURGERY — COLONOSCOPY WITH PROPOFOL
Anesthesia: General

## 2023-11-17 MED ORDER — LIDOCAINE HCL (CARDIAC) PF 100 MG/5ML IV SOSY
PREFILLED_SYRINGE | INTRAVENOUS | Status: DC | PRN
  Administered 2023-11-17: 100 mg via INTRAVENOUS

## 2023-11-17 MED ORDER — PROPOFOL 10 MG/ML IV BOLUS
INTRAVENOUS | Status: DC | PRN
Start: 2023-11-17 — End: 2023-11-17
  Administered 2023-11-17: 75 ug/kg/min via INTRAVENOUS
  Administered 2023-11-17 (×2): 40 mg via INTRAVENOUS

## 2023-11-17 MED ORDER — LACTATED RINGERS IV SOLN: INTRAVENOUS | Status: DC

## 2023-11-17 MED ORDER — PHENYLEPHRINE 80 MCG/ML (10ML) SYRINGE FOR IV PUSH (FOR BLOOD PRESSURE SUPPORT)
PREFILLED_SYRINGE | INTRAVENOUS | Status: DC | PRN
Start: 2023-11-17 — End: 2023-11-17
  Administered 2023-11-17 (×2): 80 ug via INTRAVENOUS
  Administered 2023-11-17: 160 ug via INTRAVENOUS

## 2023-11-17 MED ORDER — GLUCAGON HCL RDNA (DIAGNOSTIC) 1 MG IJ SOLR
INTRAMUSCULAR | Status: DC | PRN
  Administered 2023-11-17: .5 mg via INTRAVENOUS

## 2023-11-17 NOTE — Op Note (Signed)
 [facility] Patient Name: Jimmy Franklin Procedure Date: 11/17/2023 10:38 AM MRN: [id number] Date of Birth: [birth year] Attending MD: Gennette Pac , MD, [id number] CSN: [id number] Age: 89 Admit Type: Outpatient Procedure:                Colonoscopy Indications:              Heme positive stool, Iron deficiency anemia Providers:                Gennette Pac, MD, Nena Polio, RN, Lennice Sites Technician, Technician Referring MD:              Medicines:                Propofol per Anesthesia Complications:            No immediate complications. Estimated Blood Loss:     Estimated blood loss was minimal. Estimated blood                            loss was minimal. Single 9 mm polyp splenic flexure                            hot snare removed. Multiple cold snare                            polypectomies performed. Procedure:                Pre-Anesthesia Assessment:                           - Prior to the procedure, a History and Physical                            was performed, and patient medications and                            allergies were reviewed. The patient's tolerance of                            previous anesthesia was also reviewed. The risks                            and benefits of the procedure and the sedation                            options and risks were discussed with the patient.                            All questions were answered, and informed consent                            was obtained. Prior Anticoagulants: The patient  last took Plavix (clopidogrel) 1 day prior to the                            procedure. ASA Grade Assessment: III - A patient                            with severe systemic disease. After reviewing the                            risks and benefits, the patient was deemed in                            satisfactory condition to undergo the procedure.                            After obtaining informed consent, the colonoscope                            was passed under direct vision. Throughout the                            procedure, the patient's blood pressure, pulse, and                            oxygen saturations were monitored continuously. The                            [id number]) scope was introduced through the                            anus and advanced to the the cecum, identified by                            appendiceal orifice and ileocecal valve. The                            colonoscopy was performed without difficulty. The                            patient tolerated the procedure well. The quality                            of the bowel preparation was adequate. The                            ileocecal valve, appendiceal orifice, and rectum                            were photographed. The entire colon was well                            visualized. Scope In: 11:15:15 AM Scope Out: 12:13:46 PM Scope Withdrawal Time: 0 hours 35 minutes 31 seconds  Total Procedure Duration: 0  hours 58 minutes 31 seconds  Findings:      The perianal and digital rectal examinations were normal.      14 polyps were found in the splenic flexure, ascending colon and cecum.       The polyps were 5-6 mm in size. Semipedunculated. They were removed with       cold snare. (1) 9 mm semipedunculated polyp in the splenic flexure was       hot snare removed.      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       Please see photos.      The exam was otherwise without abnormality on direct and retroflexion       views. Impression:               - 14 - 5 mm polyps at the splenic flexure, in the                            ascending colon and in the cecum. Multiple cecal                            AVMs ablated.                           - The  examination was otherwise normal on direct                            and retroflexion views.                           - No specimens collected. Moderate Sedation:      Moderate (conscious) sedation was personally administered by an       anesthesia professional. The following parameters were monitored: oxygen       saturation, heart rate, blood pressure, respiratory rate, EKG, adequacy       of pulmonary ventilation, and response to care. Recommendation:           - Patient has a contact number available for                            emergencies. The signs and symptoms of potential                            delayed complications were discussed with the                            patient. Return to normal activities tomorrow.                            Written discharge instructions were provided to the                            patient.                           -  Advance diet as tolerated.                           - Continue present medications.                           - Repeat colonoscopy after studies are complete for                            surveillance.                           - Return to GI office in 2 months. See EGD report. Procedure Code(s):        --- Professional ---                           [phone], Colonoscopy, flexible; diagnostic, including                            collection of specimen(s) by brushing or washing,                            when performed (separate procedure) Diagnosis Code(s):        --- Professional ---                           D12.3, Benign neoplasm of transverse colon (hepatic                            flexure or splenic flexure)                           D12.2, Benign neoplasm of ascending colon                           D12.0, Benign neoplasm of cecum                           R19.5, Other fecal abnormalities                           D50.9, Iron deficiency anemia, unspecified CPT copyright 2022 American Medical Association. All  rights reserved. The codes documented in this report are preliminary and upon coder review may  be revised to meet current compliance requirements. [name]. Evelisse Szalkowski, MD Gennette Pac, MD 11/17/2023 12:26:49 PM This report has been signed electronically. Number of Addenda: 0

## 2023-11-17 NOTE — Discharge Instructions (Signed)
 EGD Discharge instructions Please read the instructions outlined below and refer to this sheet in the next few weeks. These discharge instructions provide you with general information on caring for yourself after you leave the hospital. Your doctor may also give you specific instructions. While your treatment has been planned according to the most current medical practices available, unavoidable complications occasionally occur. If you have any problems or questions after discharge, please call your doctor. ACTIVITY You may resume your regular activity but move at a slower pace for the next 24 hours.  Take frequent rest periods for the next 24 hours.  Walking will help expel (get rid of) the air and reduce the bloated feeling in your abdomen.  No driving for 24 hours (because of the anesthesia (medicine) used during the test).  You may shower.  Do not sign any important legal documents or operate any machinery for 24 hours (because of the anesthesia used during the test).  NUTRITION Drink plenty of fluids.  You may resume your normal diet.  Begin with a light meal and progress to your normal diet.  Avoid alcoholic beverages for 24 hours or as instructed by your caregiver.  MEDICATIONS You may resume your normal medications unless your caregiver tells you otherwise.  WHAT YOU CAN EXPECT TODAY You may experience abdominal discomfort such as a feeling of fullness or "gas" pains.  FOLLOW-UP Your doctor will discuss the results of your test with you.  SEEK IMMEDIATE MEDICAL ATTENTION IF ANY OF THE FOLLOWING OCCUR: Excessive nausea (feeling sick to your stomach) and/or vomiting.  Severe abdominal pain and distention (swelling).  Trouble swallowing.  Temperature over 101 F (37.8 C).  Rectal bleeding or vomiting of blood.    Colonoscopy Discharge Instructions  Read the instructions outlined below and refer to this sheet in the next few weeks. These discharge instructions provide you with  general information on caring for yourself after you leave the hospital. Your doctor may also give you specific instructions. While your treatment has been planned according to the most current medical practices available, unavoidable complications occasionally occur. If you have any problems or questions after discharge, call Dr. Jena Gauss at [phone]. ACTIVITY You may resume your regular activity, but move at a slower pace for the next 24 hours.  Take frequent rest periods for the next 24 hours.  Walking will help get rid of the air and reduce the bloated feeling in your belly (abdomen).  No driving for 24 hours (because of the medicine (anesthesia) used during the test).   Do not sign any important legal documents or operate any machinery for 24 hours (because of the anesthesia used during the test).  NUTRITION Drink plenty of fluids.  You may resume your normal diet as instructed by your doctor.  Begin with a light meal and progress to your normal diet. Heavy or fried foods are harder to digest and may make you feel sick to your stomach (nauseated).  Avoid alcoholic beverages for 24 hours or as instructed.  MEDICATIONS You may resume your normal medications unless your doctor tells you otherwise.  WHAT YOU CAN EXPECT TODAY Some feelings of bloating in the abdomen.  Passage of more gas than usual.  Spotting of blood in your stool or on the toilet paper.  IF YOU HAD POLYPS REMOVED DURING THE COLONOSCOPY: No aspirin products for 7 days or as instructed.  No alcohol for 7 days or as instructed.  Eat a soft diet for the next 24 hours.  FINDING  OUT THE RESULTS OF YOUR TEST Not all test results are available during your visit. If your test results are not back during the visit, make an appointment with your caregiver to find out the results. Do not assume everything is normal if you have not heard from your caregiver or the medical facility. It is important for you to follow up on all of your test  results.  SEEK IMMEDIATE MEDICAL ATTENTION IF: You have more than a spotting of blood in your stool.  Your belly is swollen (abdominal distention).  You are nauseated or vomiting.  You have a temperature over 101.  You have abdominal pain or discomfort that is severe or gets worse throughout the day.      your esophagus was stretched today.   stomach a little inflamed.  Biopsies taken.  Multiple polyps-15 removed from your colon.  Blood blisters in your colon (AVMs) were thermally coagulated to prevent future bleeding   office visit with [place] in 2 months   further recommendations to follow pending review of pathology report  At patient request, I called Annye Asa at [phone] rolled to voicemail.  I left a detailed message.

## 2023-11-17 NOTE — Transfer of Care (Signed)
 Immediate Anesthesia Transfer of Care Note  Patient: Jimmy Franklin  Procedure(s) Performed: COLONOSCOPY WITH PROPOFOL ESOPHAGOGASTRODUODENOSCOPY (EGD) WITH PROPOFOL POLYPECTOMY  Patient Location: Endoscopy Unit  Anesthesia Type:General  Level of Consciousness: awake  Airway & Oxygen Therapy: Patient Spontanous Breathing  Post-op Assessment: Report given to RN and Post -op Vital signs reviewed and stable  Post vital signs: Reviewed and stable  Last Vitals:  Vitals Value Taken Time  BP    Temp    Pulse    Resp    SpO2      Last Pain:  Vitals:   11/17/23 0909  TempSrc: Oral  PainSc: 0-No pain         Complications: No notable events documented.

## 2023-11-17 NOTE — H&P (Signed)
 @LOGO @   Primary Care Physician:  Richardean Chimera, MD Primary Gastroenterologist:  Dr.   Pre-Procedure History & Physical: HPI:  Jimmy Franklin is a 89 y.o. male here for  for further evaluation of iron deficiency anemia/melena via EGD and colonoscopy distant history of colonic adenomas without follow-up last colonoscopy approximately years ago.  On Plavix-obligatory treatment.  patient notes recurrent dysphagia.  Schatzki's ring dilated in the past.  Past Medical History:  Diagnosis Date   Aortic stenosis    mild to moderate on echo 2023, LV EF >70   Bowel habit changes 03/21/2011   CAD (coronary artery disease)    CHF (congestive heart failure) (HCC)    Chronic constipation 2008   COPD (chronic obstructive pulmonary disease) (HCC)    Depression    Enlarged prostate    Foley catheter in place 03/17/2013   GERD (gastroesophageal reflux disease)    Hearing loss of both ears    Hepatitis    20 yrs ago, states he turned yellow    HTN (hypertension)    Hx of colonic polyps    Hyperlipidemia    Mixed hyperlipidemia    Polymyalgia rheumatica (HCC)    PVD (peripheral vascular disease) (HCC)    Seizures (HCC)    caused by UTI-none recent- over  2 yr   Stroke [facility])    CVA 2013, left sided weakness, mild residual, last cva 05/2022   Subarachnoid hemorrhage (HCC)    Unspecified essential hypertension     Past Surgical History:  Procedure Laterality Date   BIOPSY  07/08/2017   Procedure: BIOPSY;  Surgeon: Corbin Ade, MD;  Location: [facility];  Service: Endoscopy;;  gastric   CATARACT EXTRACTION, BILATERAL     CHOLECYSTECTOMY     lap. gallbladder removal   COLONOSCOPY  07/04/2007   CORONARY ANGIOPLASTY WITH STENT PLACEMENT  2016   ESOPHAGOGASTRODUODENOSCOPY (EGD) WITH PROPOFOL N/A 07/08/2017   Procedure: ESOPHAGOGASTRODUODENOSCOPY (EGD) WITH PROPOFOL;  Surgeon: Corbin Ade, MD;  Location: [facility];  Service: Endoscopy;  Laterality: N/A;  7:30am   MALONEY  DILATION N/A 07/08/2017   Procedure: MALONEY DILATION;  Surgeon: Corbin Ade, MD;  Location: [facility];  Service: Endoscopy;  Laterality: N/A;   TONSILLECTOMY     TRANSURETHRAL INCISION OF BLADDER NECK N/A 01/24/2015   Procedure: TRANSURETHRAL INCISION OF BLADDER NECK;  Surgeon: Marcine Matar, MD;  Location: [facility];  Service: Urology;  Laterality: N/A;  **TRANSURETHRAL RESECTION BLADDER NECK CONTRACTURE**      TRANSURETHRAL RESECTION OF PROSTATE N/A 03/23/2013   Procedure: TRANSURETHRAL RESECTION OF THE PROSTATE (TURP);  Surgeon: Marcine Matar, MD;  Location: [facility];  Service: Urology;  Laterality: N/A;    Prior to Admission medications   Medication Sig Start Date End Date Taking? Authorizing Provider  atorvastatin (LIPITOR) 40 MG tablet Take 40 mg by mouth daily.   Yes [provider]  clopidogrel (PLAVIX) 75 MG tablet Take 75 mg by mouth daily.   Yes [provider]  levothyroxine (SYNTHROID) 75 MCG tablet Take 75 mcg by mouth daily. 09/02/23  Yes [provider]  losartan (COZAAR) 25 MG tablet Take 25 mg by mouth daily. 09/02/23  Yes [provider]  pantoprazole (PROTONIX) 40 MG tablet TAKE 1 TABLET BY MOUTH ONCE DAILY. 12/20/18  Yes Tiffany Kocher, PA-C  phenytoin (DILANTIN) 100 MG ER capsule Take 200 mg by mouth 2 (two) times daily.    Yes [provider]  sertraline (ZOLOFT) 100 MG  tablet  10/11/20  Yes [provider]  Cholecalciferol (VITAMIN D3) 50 MCG (2000 UT) capsule Take 2,000 Units by mouth daily.    [provider]  ferrous sulfate 324 MG TBEC Take 324 mg by mouth daily with breakfast.    [provider]  Vitamin D, Ergocalciferol, (DRISDOL) 1.25 MG (50000 UNIT) CAPS capsule Take 50,000 Units by mouth once a week.    [provider]    Allergies as of 10/07/2023   (No Known Allergies)    Family History  Problem Relation Age of Onset   Colon cancer Mother    Colon cancer  Father     Social History   Socioeconomic History   Marital status: Married    Spouse name: Not on file   Number of children: Not on file   Years of education: Not on file   Highest education level: Not on file  Occupational History   Not on file  Tobacco Use   Smoking status: Former    Current packs/day: 0.00    Average packs/day: 3.0 packs/day for 15.0 years (45.0 ttl pk-yrs)    Types: Cigarettes    Start date: 03/17/1952    Quit date: 03/18/1967    Years since quitting: 56.7   Smokeless tobacco: Former  Building services engineer status: Never Used  Substance and Sexual Activity   Alcohol use: No   Drug use: No   Sexual activity: Not Currently  Other Topics Concern   Not on file  Social History Narrative   Not on file   Social Drivers of Health   Financial Resource Strain: Low Risk  (05/29/2022)   Received [facility]   Overall Financial Resource Strain (CARDIA)    Difficulty of Paying Living Expenses: Not very hard  Food Insecurity: No Food Insecurity (06/17/2022)   Received from [facility], [facility]   Hunger Vital Sign    Worried About Running Out of Food in the Last Year: Never true    Ran Out of Food in the Last Year: Never true  Transportation Needs: No Transportation Needs (05/31/2022)   Received from [facility], [facility]   PRAPARE - Transportation    Lack of Transportation (Medical): No    Lack of Transportation (Non-Medical): No  Physical Activity: Not on file  Stress: Stress Concern Present (05/28/2022)   Received from [facility], [facility] of Occupational Health - Occupational Stress Questionnaire    Feeling of Stress : To some extent  Social Connections: Unknown (05/28/2022)   Received from [facility], [facility]   Social Network    Social Network: Not on file  Intimate Partner Violence: Unknown (05/28/2022)   Received from [facility], [facility]   HITS    Physically Hurt: Not on  file    Insult or Talk Down To: Not on file    Threaten Physical Harm: Not on file    Scream or Curse: Not on file    Review of Systems: See HPI, otherwise negative ROS  Physical Exam: BP 136/70   Pulse 90   Temp (!) 97.5 F (36.4 C) (Oral)   Resp 19   Ht 6\' 2"  (1.88 m)   Wt 90.5 kg   SpO2 94%   BMI 25.62 kg/m  General:   Alert,  Well-developed, well-nourished, pleasant and cooperative in NAD Lungs:  Clear throughout to auscultation.   No wheezes, crackles, or rhonchi. No acute distress. Heart:  Regular  rate and rhythm; no murmurs, clicks, rubs,  or gallops. Abdomen: Non-distended, normal bowel sounds.  Soft and nontender without appreciable mass or hepatosplenomegaly.  Impression/Plan:    89 year old gentleman with iron deficiency anemia and melena distant history of colonic adenomas.  Recurrent dysphagia now known Schatzki's ring dilated successfully previously.  I have offered the patient EGD with esophageal dilation is feasible/appropriate per plan as well as a diagnostic colonoscopy. The risks, benefits, limitations, imponderables and alternatives regarding both EGD and colonoscopy have been reviewed with the patient. Questions have been answered. All parties agreeable.      He will remain on Plavix per plan.     Notice: This dictation was prepared with Dragon dictation along with smaller phrase technology. Any transcriptional errors that result from this process are unintentional and may not be corrected upon review.

## 2023-11-17 NOTE — Anesthesia Preprocedure Evaluation (Signed)
 Anesthesia Evaluation  Patient identified by MRN, date of birth, ID band Patient awake    Reviewed: Allergy & Precautions, H&P , NPO status , Patient's Chart, lab work & pertinent test results, reviewed documented beta blocker date and time   Airway Mallampati: II  TM Distance: >3 FB Neck ROM: full    Dental no notable dental hx.    Pulmonary COPD, former smoker   Pulmonary exam normal breath sounds clear to auscultation       Cardiovascular Exercise Tolerance: Good hypertension, + CAD, + Peripheral Vascular Disease and +CHF   Rhythm:regular Rate:Normal     Neuro/Psych Seizures -,  PSYCHIATRIC DISORDERS  Depression     Neuromuscular disease CVA, Residual Symptoms    GI/Hepatic ,GERD  ,,(+) Hepatitis -  Endo/Other  negative endocrine ROS    Renal/GU negative Renal ROS  negative genitourinary   Musculoskeletal   Abdominal   Peds  Hematology  (+) Blood dyscrasia, anemia   Anesthesia Other Findings   Reproductive/Obstetrics negative OB ROS                             Anesthesia Physical Anesthesia Plan  ASA: 3  Anesthesia Plan: General   Post-op Pain Management:    Induction:   PONV Risk Score and Plan: Propofol infusion  Airway Management Planned:   Additional Equipment:   Intra-op Plan:   Post-operative Plan:   Informed Consent: I have reviewed the patients History and Physical, chart, labs and discussed the procedure including the risks, benefits and alternatives for the proposed anesthesia with the patient or authorized representative who has indicated his/her understanding and acceptance.     Dental Advisory Given  Plan Discussed with: CRNA  Anesthesia Plan Comments:        Anesthesia Quick Evaluation

## 2023-11-17 NOTE — Op Note (Signed)
 [facility] Patient Name: Jimmy Franklin Procedure Date: 11/17/2023 10:39 AM MRN: [id number] Date of Birth: 06-02-1934 Attending MD: Gennette Pac , MD, [id number] CSN: [id number] Age: 89 Admit Type: Outpatient Procedure:                Upper GI endoscopy Indications:              Iron deficiency anemia, Dysphagia, Melena Providers:                Gennette Pac, MD, Nena Polio, RN, Lennice Sites Technician, Technician Referring MD:              Medicines:                Propofol per Anesthesia Complications:            No immediate complications. Estimated Blood Loss:     Estimated blood loss was minimal. Procedure:                Pre-Anesthesia Assessment:                           - Prior to the procedure, a History and Physical                            was performed, and patient medications and                            allergies were reviewed. The patient's tolerance of                            previous anesthesia was also reviewed. The risks                            and benefits of the procedure and the sedation                            options and risks were discussed with the patient.                            All questions were answered, and informed consent                            was obtained. Prior Anticoagulants: The patient has                            taken no anticoagulant or antiplatelet agents. ASA                            Grade Assessment: III - A patient with severe                            systemic disease. After reviewing the risks and  benefits, the patient was deemed in satisfactory                            condition to undergo the procedure.                           After obtaining informed consent, the endoscope was                            passed under direct vision. Throughout the                            procedure, the patient's blood pressure, pulse, and                             oxygen saturations were monitored continuously. The                            GIF-H190 ([id number]) scope was introduced through the                            mouth, and advanced to the second part of duodenum.                            The upper GI endoscopy was accomplished without                            difficulty. The patient tolerated the procedure                            well. Scope In: 11:00:53 AM Scope Out: 11:09:12 AM Total Procedure Duration: 0 hours 8 minutes 19 seconds  Findings:      The examined esophagus was normal. Gastric cavity empty. Patchy erythema       and somewhat of a scarlatiniform appearance of the gastric mucosa. No       ulcer or infiltrating process. Pylorus patent      The duodenal bulb and second portion of the duodenum were normal. The       scope was withdrawn. Dilation was performed with a Maloney dilator with       mild resistance at 56 Fr. The dilation site was examined following       endoscope reinsertion and showed no change. Estimated blood loss: none.       Finally, biopsies of the antrum and gastric body were taken for       histologic study. Impression:               - Normal esophagus. Dilated. Abnormal gastric                            mucosa of uncertain significance?status post                            gastric biopsy                           -  Normal duodenal bulb and second portion of the                            duodenum. Moderate Sedation:      Moderate (conscious) sedation was personally administered by an       anesthesia professional. The following parameters were monitored: oxygen       saturation, heart rate, blood pressure, respiratory rate, EKG, adequacy       of pulmonary ventilation, and response to care. Recommendation:           - Patient has a contact number available for                            emergencies. The signs and symptoms of potential                            delayed  complications were discussed with the                            patient. Return to normal activities tomorrow.                            Written discharge instructions were provided to the                            patient.                           - Continue present medications. Follow-up on                            pathology. See colonoscopy report Procedure Code(s):        --- Professional ---                           [id number], Esophagogastroduodenoscopy, flexible,                            transoral; diagnostic, including collection of                            specimen(s) by brushing or washing, when performed                            (separate procedure)                           43450, Dilation of esophagus, by unguided sound or                            bougie, single or multiple passes Diagnosis Code(s):        --- Professional ---                           D50.9, Iron deficiency anemia, unspecified  R13.10, Dysphagia, unspecified                           K92.1, Melena (includes Hematochezia) CPT copyright 2022 American Medical Association. All rights reserved. The codes documented in this report are preliminary and upon coder review may  be revised to meet current compliance requirements. [name]. [name], MD Gennette Pac, MD 11/17/2023 12:18:41 PM This report has been signed electronically. Number of Addenda: 0

## 2023-11-18 ENCOUNTER — Encounter (HOSPITAL_COMMUNITY): Payer: Self-pay | Admitting: Internal Medicine

## 2023-11-19 NOTE — Anesthesia Postprocedure Evaluation (Signed)
 Anesthesia Post Note  Patient: Jimmy Franklin  Procedure(s) Performed: COLONOSCOPY WITH PROPOFOL ESOPHAGOGASTRODUODENOSCOPY (EGD) WITH PROPOFOL POLYPECTOMY  Patient location during evaluation: Phase II Anesthesia Type: General Level of consciousness: awake Pain management: pain level controlled Vital Signs Assessment: post-procedure vital signs reviewed and stable Respiratory status: spontaneous breathing and respiratory function stable Cardiovascular status: blood pressure returned to baseline and stable Postop Assessment: no headache and no apparent nausea or vomiting Anesthetic complications: no Comments: Late entry   No notable events documented.   Last Vitals:  Vitals:   11/17/23 0909 11/17/23 1223  BP: 136/70 (!) 119/52  Pulse: 90 79  Resp: 19 18  Temp: (!) 36.4 C 36.4 C  SpO2: 94% 100%    Last Pain:  Vitals:   11/18/23 1330  TempSrc:   PainSc: 0-No pain                 Windell Norfolk

## 2023-11-22 ENCOUNTER — Encounter: Payer: Self-pay | Admitting: Internal Medicine

## 2023-11-22 LAB — SURGICAL PATHOLOGY

## 2023-12-02 DIAGNOSIS — R339 Retention of urine, unspecified: Secondary | ICD-10-CM | POA: Diagnosis not present

## 2023-12-02 DIAGNOSIS — R55 Syncope and collapse: Secondary | ICD-10-CM | POA: Diagnosis not present

## 2023-12-02 DIAGNOSIS — R1031 Right lower quadrant pain: Secondary | ICD-10-CM | POA: Diagnosis not present

## 2023-12-02 DIAGNOSIS — N281 Cyst of kidney, acquired: Secondary | ICD-10-CM | POA: Diagnosis not present

## 2023-12-02 DIAGNOSIS — J449 Chronic obstructive pulmonary disease, unspecified: Secondary | ICD-10-CM | POA: Diagnosis not present

## 2023-12-02 DIAGNOSIS — I2510 Atherosclerotic heart disease of native coronary artery without angina pectoris: Secondary | ICD-10-CM | POA: Diagnosis not present

## 2023-12-02 DIAGNOSIS — N3000 Acute cystitis without hematuria: Secondary | ICD-10-CM | POA: Diagnosis not present

## 2023-12-02 DIAGNOSIS — R58 Hemorrhage, not elsewhere classified: Secondary | ICD-10-CM | POA: Diagnosis not present

## 2023-12-02 DIAGNOSIS — R0902 Hypoxemia: Secondary | ICD-10-CM | POA: Diagnosis not present

## 2023-12-02 DIAGNOSIS — I4510 Unspecified right bundle-branch block: Secondary | ICD-10-CM | POA: Diagnosis not present

## 2023-12-02 DIAGNOSIS — R188 Other ascites: Secondary | ICD-10-CM | POA: Diagnosis not present

## 2023-12-02 DIAGNOSIS — K5730 Diverticulosis of large intestine without perforation or abscess without bleeding: Secondary | ICD-10-CM | POA: Diagnosis not present

## 2023-12-02 DIAGNOSIS — I10 Essential (primary) hypertension: Secondary | ICD-10-CM | POA: Diagnosis not present

## 2023-12-02 DIAGNOSIS — Z1152 Encounter for screening for COVID-19: Secondary | ICD-10-CM | POA: Diagnosis not present

## 2023-12-02 DIAGNOSIS — R1084 Generalized abdominal pain: Secondary | ICD-10-CM | POA: Diagnosis not present

## 2023-12-02 DIAGNOSIS — R569 Unspecified convulsions: Secondary | ICD-10-CM | POA: Diagnosis not present

## 2023-12-02 DIAGNOSIS — Z20822 Contact with and (suspected) exposure to covid-19: Secondary | ICD-10-CM | POA: Diagnosis not present

## 2023-12-03 DIAGNOSIS — N3000 Acute cystitis without hematuria: Secondary | ICD-10-CM | POA: Diagnosis not present

## 2023-12-03 DIAGNOSIS — J449 Chronic obstructive pulmonary disease, unspecified: Secondary | ICD-10-CM | POA: Diagnosis not present

## 2023-12-03 DIAGNOSIS — R188 Other ascites: Secondary | ICD-10-CM | POA: Diagnosis not present

## 2023-12-03 DIAGNOSIS — I10 Essential (primary) hypertension: Secondary | ICD-10-CM | POA: Diagnosis not present

## 2023-12-03 DIAGNOSIS — K5730 Diverticulosis of large intestine without perforation or abscess without bleeding: Secondary | ICD-10-CM | POA: Diagnosis not present

## 2023-12-03 DIAGNOSIS — T83098A Other mechanical complication of other indwelling urethral catheter, initial encounter: Secondary | ICD-10-CM | POA: Diagnosis not present

## 2023-12-03 DIAGNOSIS — N281 Cyst of kidney, acquired: Secondary | ICD-10-CM | POA: Diagnosis not present

## 2023-12-03 DIAGNOSIS — T83511A Infection and inflammatory reaction due to indwelling urethral catheter, initial encounter: Secondary | ICD-10-CM | POA: Diagnosis not present

## 2023-12-06 DIAGNOSIS — E039 Hypothyroidism, unspecified: Secondary | ICD-10-CM | POA: Diagnosis not present

## 2023-12-06 DIAGNOSIS — E1122 Type 2 diabetes mellitus with diabetic chronic kidney disease: Secondary | ICD-10-CM | POA: Diagnosis not present

## 2023-12-06 DIAGNOSIS — E782 Mixed hyperlipidemia: Secondary | ICD-10-CM | POA: Diagnosis not present

## 2023-12-06 DIAGNOSIS — D509 Iron deficiency anemia, unspecified: Secondary | ICD-10-CM | POA: Diagnosis not present

## 2023-12-15 ENCOUNTER — Encounter: Payer: Self-pay | Admitting: Gastroenterology

## 2024-01-04 DIAGNOSIS — K2100 Gastro-esophageal reflux disease with esophagitis, without bleeding: Secondary | ICD-10-CM | POA: Diagnosis not present

## 2024-01-04 DIAGNOSIS — E1122 Type 2 diabetes mellitus with diabetic chronic kidney disease: Secondary | ICD-10-CM | POA: Diagnosis not present

## 2024-01-04 DIAGNOSIS — Z13220 Encounter for screening for lipoid disorders: Secondary | ICD-10-CM | POA: Diagnosis not present

## 2024-01-04 DIAGNOSIS — D509 Iron deficiency anemia, unspecified: Secondary | ICD-10-CM | POA: Diagnosis not present

## 2024-01-04 DIAGNOSIS — R300 Dysuria: Secondary | ICD-10-CM | POA: Diagnosis not present

## 2024-01-05 DIAGNOSIS — E039 Hypothyroidism, unspecified: Secondary | ICD-10-CM | POA: Diagnosis not present

## 2024-01-05 DIAGNOSIS — E782 Mixed hyperlipidemia: Secondary | ICD-10-CM | POA: Diagnosis not present

## 2024-01-05 DIAGNOSIS — D509 Iron deficiency anemia, unspecified: Secondary | ICD-10-CM | POA: Diagnosis not present

## 2024-01-05 DIAGNOSIS — E1122 Type 2 diabetes mellitus with diabetic chronic kidney disease: Secondary | ICD-10-CM | POA: Diagnosis not present

## 2024-01-06 DIAGNOSIS — K2100 Gastro-esophageal reflux disease with esophagitis, without bleeding: Secondary | ICD-10-CM | POA: Diagnosis not present

## 2024-01-06 DIAGNOSIS — E782 Mixed hyperlipidemia: Secondary | ICD-10-CM | POA: Diagnosis not present

## 2024-01-06 DIAGNOSIS — I10 Essential (primary) hypertension: Secondary | ICD-10-CM | POA: Diagnosis not present

## 2024-01-06 DIAGNOSIS — Z6828 Body mass index (BMI) 28.0-28.9, adult: Secondary | ICD-10-CM | POA: Diagnosis not present

## 2024-01-06 DIAGNOSIS — E1122 Type 2 diabetes mellitus with diabetic chronic kidney disease: Secondary | ICD-10-CM | POA: Diagnosis not present

## 2024-02-02 ENCOUNTER — Telehealth: Payer: Self-pay | Admitting: Urology

## 2024-02-02 NOTE — Telephone Encounter (Signed)
 Having pain from cath. Would like to see a nurse

## 2024-02-02 NOTE — Telephone Encounter (Signed)
 I spoke with Jimmy Franklin, she states that the catheter was last changed in the hospital on 0328.  Patient scheduled for NV cath change on 05/29 and will  be scheduled for a follow up with MD at that time.

## 2024-02-02 NOTE — Telephone Encounter (Signed)
 Returned call with no answer, left vm to return call.  Office number provided.

## 2024-02-03 ENCOUNTER — Ambulatory Visit (INDEPENDENT_AMBULATORY_CARE_PROVIDER_SITE_OTHER)

## 2024-02-03 DIAGNOSIS — R339 Retention of urine, unspecified: Secondary | ICD-10-CM | POA: Diagnosis not present

## 2024-02-03 MED ORDER — CIPROFLOXACIN HCL 500 MG PO TABS
500.0000 mg | ORAL_TABLET | Freq: Once | ORAL | Status: AC
  Administered 2024-02-03: 500 mg via ORAL

## 2024-02-03 NOTE — Progress Notes (Signed)
 Cath Change/ Replacement  Patient is present today for a catheter change due to urinary retention.  10ml of water was removed from the balloon, a 16FR coude foley cath was removed without difficulty.  Patient was cleaned and prepped in a sterile fashion with [email or web address].  A 16 FR foley cath was replaced into the bladder, no complications were noted. Urine return was noted 20ml and urine was Dark yellow in color. The balloon was filled with 10ml of sterile water. A leg bag was attached for drainage.  A night bag was also given to the patient and patient was given instruction on how to change from one bag to another. Patient was given proper instruction on catheter care.    Performed by: Wyonia Hefty, CMA  Follow up: 4 weeks with MD

## 2024-02-03 NOTE — Telephone Encounter (Signed)
 Patient seen in office on 05/29 for a catheter change.  He was scheduled for an office visit with Isa Manuel for next available on 03/07/2024.  Patient has complaints of testicular pain and swelling.  Patient advised to go to the ER if pain worsen.  No fever and patient denies any other symptoms at this time.  Do you have any recommendations for patient until next appt.

## 2024-02-04 DIAGNOSIS — D509 Iron deficiency anemia, unspecified: Secondary | ICD-10-CM | POA: Diagnosis not present

## 2024-02-04 DIAGNOSIS — E039 Hypothyroidism, unspecified: Secondary | ICD-10-CM | POA: Diagnosis not present

## 2024-02-04 DIAGNOSIS — E1122 Type 2 diabetes mellitus with diabetic chronic kidney disease: Secondary | ICD-10-CM | POA: Diagnosis not present

## 2024-02-04 DIAGNOSIS — E782 Mixed hyperlipidemia: Secondary | ICD-10-CM | POA: Diagnosis not present

## 2024-02-05 DIAGNOSIS — N451 Epididymitis: Secondary | ICD-10-CM | POA: Diagnosis not present

## 2024-02-05 DIAGNOSIS — Z6829 Body mass index (BMI) 29.0-29.9, adult: Secondary | ICD-10-CM | POA: Diagnosis not present

## 2024-02-07 NOTE — Telephone Encounter (Signed)
 Per pts granddaughter patient's pcp is treating with abx, swelling and pain has improved.  Patient will follow up as scheduled.

## 2024-02-07 NOTE — Progress Notes (Signed)
 Name: Jimmy Franklin DOB: 07-19-1934 MRN: [id number]  History of Present Illness: Jimmy Franklin is a 89 y.o. male who presents today for follow up visit at [facility] [place].  GU History includes: 1. BPH with incomplete bladder emptying. - 2014: TURP procedure.   - 2016: Incision of a bladder neck contracture. "Following that he had recurrence and inadequate bladder emptying.  He was catheter dependent for some time.  In late summer, 2023 he decided he did not want a catheter.  He was followed carefully thereafter and he had appropriate bladder emptying."   At last visit with Dr. Joie Narrow on 02/23/2023: "He empties a fair amount of urine into his diaper.  He does have a strong stream at times.  He does not have gross hematuria or dysuria.  Urine is intermittently cloudy.  He has not been treated for any infections over the past few months." PVR >130 ml.  Since last visit: > 12/02/2023: Seen in ER for suspected UTI and incomplete bladder emptying. Foley catheter placed. Cipro  prescribed.   > 12/02/2023: Seen in ER for catheter issue. Foley was changed to coud catheter with relief of the pain and improvement of urine flow.   > 02/03/2024: Urology nurse visit for catheter exchange.  Today: He reports that about 2 weeks ago he started having bilateral scrotal swelling with pain on the right side. Also some RLQ abdominal pain. His PCP started him on Doxycycline  100 mg 2x/day on Saturday 02/05/2024; since then patient reports that the pain has significantly improved however the swelling has remained the same. Denies fevers, chills, or sweats. Reports feeling more weak and tired than usual. He reports the catheter is draining well. He denies gross hematuria, flank pain, nausea, or vomiting.   Medications: Current Outpatient Medications  Medication Sig Dispense Refill   doxycycline  (VIBRAMYCIN ) 100 MG capsule Take 100 mg by mouth 2 (two) times daily.     atorvastatin (LIPITOR) 40 MG  tablet Take 40 mg by mouth daily.     Cholecalciferol (VITAMIN D3) 50 MCG (2000 UT) capsule Take 2,000 Units by mouth daily.     clopidogrel  (PLAVIX ) 75 MG tablet Take 75 mg by mouth daily.     ferrous sulfate 324 MG TBEC Take 324 mg by mouth daily with breakfast.     levothyroxine (SYNTHROID) 75 MCG tablet Take 75 mcg by mouth daily.     losartan (COZAAR) 25 MG tablet Take 25 mg by mouth daily.     pantoprazole (PROTONIX) 40 MG tablet TAKE 1 TABLET BY MOUTH ONCE DAILY. 90 tablet 2   phenytoin  (DILANTIN ) 100 MG ER capsule Take 200 mg by mouth 2 (two) times daily.      sertraline  (ZOLOFT ) 100 MG tablet      Vitamin D, Ergocalciferol, (DRISDOL) 1.25 MG (50000 UNIT) CAPS capsule Take 50,000 Units by mouth once a week.     No current facility-administered medications for this visit.    Allergies: No Known Allergies  Past Medical History:  Diagnosis Date   Aortic stenosis    mild to moderate on echo 2023, LV EF >70   Bowel habit changes 03/21/2011   CAD (coronary artery disease)    CHF (congestive heart failure) (HCC)    Chronic constipation 2008   COPD (chronic obstructive pulmonary disease) (HCC)    Depression    Enlarged prostate    Foley catheter in place 03/17/2013   GERD (gastroesophageal reflux disease)    Hearing loss of both ears  Hepatitis    20 yrs ago, states he turned yellow    HTN (hypertension)    Hx of colonic polyps    Hyperlipidemia    Mixed hyperlipidemia    Polymyalgia rheumatica (HCC)    PVD (peripheral vascular disease) (HCC)    Seizures (HCC)    caused by UTI-none recent- over  2 yr   Stroke [facility])    CVA 2013, left sided weakness, mild residual, last cva 05/2022   Subarachnoid hemorrhage (HCC)    Unspecified essential hypertension    Past Surgical History:  Procedure Laterality Date   BIOPSY  07/08/2017   Procedure: BIOPSY;  Surgeon: Suzette Espy, MD;  [facility];  Service: Endoscopy;;  gastric   CATARACT EXTRACTION, BILATERAL      CHOLECYSTECTOMY     lap. gallbladder removal   COLONOSCOPY  07/04/2007   COLONOSCOPY WITH PROPOFOL  N/A 11/17/2023   Procedure: COLONOSCOPY WITH PROPOFOL ;  Surgeon: Suzette Espy, MD;  [facility];  Service: Endoscopy;  Laterality: N/A;  11:00 AM, ASA 3/4   CORONARY ANGIOPLASTY WITH STENT PLACEMENT  2016   ESOPHAGOGASTRODUODENOSCOPY (EGD) WITH PROPOFOL  N/A 07/08/2017   Procedure: ESOPHAGOGASTRODUODENOSCOPY (EGD) WITH PROPOFOL ;  Surgeon: Suzette Espy, MD;  [facility];  Service: Endoscopy;  Laterality: N/A;  7:30am   ESOPHAGOGASTRODUODENOSCOPY (EGD) WITH PROPOFOL  N/A 11/17/2023   Procedure: ESOPHAGOGASTRODUODENOSCOPY (EGD) WITH PROPOFOL ;  Surgeon: Suzette Espy, MD;  [facility];  Service: Endoscopy;  Laterality: N/A;  11:00 AM, ASA 3/4   MALONEY DILATION N/A 07/08/2017   Procedure: MALONEY DILATION;  Surgeon: Suzette Espy, MD;  [facility];  Service: Endoscopy;  Laterality: N/A;   POLYPECTOMY  11/17/2023   Procedure: POLYPECTOMY;  Surgeon: Suzette Espy, MD;  [facility];  Service: Endoscopy;;   TONSILLECTOMY     TRANSURETHRAL INCISION OF BLADDER NECK N/A 01/24/2015   Procedure: TRANSURETHRAL INCISION OF BLADDER NECK;  Surgeon: Trent Frizzle, MD;  [facility];  Service: Urology;  Laterality: N/A;  **TRANSURETHRAL RESECTION BLADDER NECK CONTRACTURE**      TRANSURETHRAL RESECTION OF PROSTATE N/A 03/23/2013   Procedure: TRANSURETHRAL RESECTION OF THE PROSTATE (TURP);  Surgeon: Trent Frizzle, MD;  [facility];  Service: Urology;  Laterality: N/A;   Family History  Problem Relation Age of Onset   Colon cancer Mother    Colon cancer Father    Social History   Socioeconomic History   Marital status: Married    Spouse name: Not on file   Number of children: Not on file   Years of education: Not on file   Highest education level: Not on file  Occupational History   Not on file  Tobacco Use   Smoking  status: Former    Current packs/day: 0.00    Average packs/day: 3.0 packs/day for 15.0 years (45.0 ttl pk-yrs)    Types: Cigarettes    Start date: 03/17/1952    Quit date: 03/18/1967    Years since quitting: 56.9   Smokeless tobacco: Former  Building services engineer status: Never Used  Substance and Sexual Activity   Alcohol  use: No   Drug use: No   Sexual activity: Not Currently  Other Topics Concern   Not on file  Social History Narrative   Not on file   Social Drivers of Health   Financial Resource Strain: Low Risk  (05/29/2022)   Received from [facility], [facility]   Overall Financial Resource  Strain (CARDIA)    Difficulty of Paying Living Expenses: Not very hard  Food Insecurity: No Food Insecurity (06/17/2022)   Received from [facility], [facility]   Hunger Vital Sign    Worried About Running Out of Food in the Last Year: Never true    Ran Out of Food in the Last Year: Never true  Transportation Needs: No Transportation Needs (05/31/2022)   Received from [facility], [facility]   PRAPARE - Transportation    Lack of Transportation (Medical): No    Lack of Transportation (Non-Medical): No  Physical Activity: Not on file  Stress: Stress Concern Present (05/28/2022)   Received from [facility] - Occupational Stress Questionnaire    Feeling of Stress : To some extent  Social Connections: Unknown (05/28/2022)   Received from [facility], [facility]   Social Network    Social Network: Not on file  Intimate Partner Violence: Unknown (05/28/2022)   Received from [facility], [facility]   HITS    Physically Hurt: Not on file    Insult or Talk Down To: Not on file    Threaten Physical Harm: Not on file    Scream or Curse: Not on file    Review of Systems Constitutional: Patient reports fatigue lntegumentary: Patient denies any rashes or pruritus Cardiovascular: Patient denies chest pain or  syncope Respiratory: Patient denies shortness of breath Gastrointestinal: As per HPI Musculoskeletal: Patient reports weakness Neurologic: Patient denies convulsions or seizures Allergic/Immunologic: Patient denies recent allergic reaction(s) Hematologic/Lymphatic: Patient denies bleeding tendencies Endocrine: Patient denies heat/cold intolerance  GU: As per HPI.  OBJECTIVE Vitals:   02/08/24 1129  BP: 139/67  Pulse: 93  Temp: (!) 97.5 F (36.4 C)   There is no height or weight on file to calculate BMI.  Physical Examination Constitutional: No obvious distress; patient is non-toxic appearing  Cardiovascular: No visible lower extremity edema.  Respiratory: The patient does not have audible wheezing/stridor; respirations do not appear labored  Gastrointestinal: Abdomen non-distended Musculoskeletal: Normal ROM of UEs  Skin: No obvious rashes/open sores  Neurologic: CN 2-12 grossly intact Psychiatric: Answered questions appropriately with normal affect  Hematologic/Lymphatic/Immunologic: No obvious bruises or sites of spontaneous bleeding  Genitourinary: Penis is normal in appearance.  Right hemiscrotum is edematous and indurated with no fluctuance or warmth. Mild erythema and tenderness to palpation at superior aspect near epididymis. Left hemiscrotum is unremarkable.  Pelvic exam was chaperoned by patient's adult grandson.   ASSESSMENT Urinary retention  Foley catheter in place  BNC (bladder neck contracture)  Benign prostatic hyperplasia with incomplete bladder emptying  Former smoker  Ambulatory dysfunction  Presentation consistent with epididymitis. Advised to complete antibiotic as prescribed by PCP and to notify Urology if symptoms persist or worsen after completion. Patient was made aware that even once infection is gone that scrotal swelling can take awhile to resolve. Advised rest, application of ice, analgesics PRN, scrotal support throughout the day with  supportive underwear and/or jock strap, and scrotal elevation when at rest for comfort and to promote drainage.   Will continue with indwelling Foley catheter for now. Due for catheter exchange in 3-4 weeks; he requested to see Dr. Joie Narrow for recheck at that time. All questions were answered.  PLAN Advised the following: 1. Complete Doxycycline  100 mg 2x/day for 7 days. 2. Return in 4 weeks (on 03/07/2024) for f/u with Dr. Joie Narrow (per pt request); will have cath exchange at that time.  No orders of the defined types were placed in this encounter.   It has been explained that the patient is to follow regularly with their PCP in addition to all other providers involved in their care and to follow instructions provided by these respective offices. Patient advised to contact urology clinic if any urologic-pertaining questions, concerns, new symptoms or problems arise in the interim period.  There are no Patient Instructions on file for this visit.  Electronically signed by:  Lauretta Ponto, FNP   02/08/24    12:36 PM

## 2024-02-08 ENCOUNTER — Telehealth: Payer: Self-pay

## 2024-02-08 ENCOUNTER — Encounter: Payer: Self-pay | Admitting: Urology

## 2024-02-08 ENCOUNTER — Ambulatory Visit (INDEPENDENT_AMBULATORY_CARE_PROVIDER_SITE_OTHER): Admitting: Urology

## 2024-02-08 VITALS — BP 139/67 | HR 93 | Temp 97.5°F

## 2024-02-08 DIAGNOSIS — N320 Bladder-neck obstruction: Secondary | ICD-10-CM | POA: Diagnosis not present

## 2024-02-08 DIAGNOSIS — Z87891 Personal history of nicotine dependence: Secondary | ICD-10-CM | POA: Diagnosis not present

## 2024-02-08 DIAGNOSIS — N401 Enlarged prostate with lower urinary tract symptoms: Secondary | ICD-10-CM

## 2024-02-08 DIAGNOSIS — R338 Other retention of urine: Secondary | ICD-10-CM | POA: Diagnosis not present

## 2024-02-08 DIAGNOSIS — R339 Retention of urine, unspecified: Secondary | ICD-10-CM

## 2024-02-08 DIAGNOSIS — R262 Difficulty in walking, not elsewhere classified: Secondary | ICD-10-CM

## 2024-02-08 DIAGNOSIS — Z978 Presence of other specified devices: Secondary | ICD-10-CM

## 2024-02-08 NOTE — Telephone Encounter (Signed)
 Tried calling PCP and was unable to reach any one. Medical release form up front for patient to sign.

## 2024-02-08 NOTE — Telephone Encounter (Signed)
-----   Message from Lauretta Ponto sent at 02/07/2024  4:54 PM EDT ----- Patient coming tomorrow for follow up. Per phone notes last week: - patient has complaints of testicular pain and swelling.  - possible epididymitis per Dr. Joie Narrow - Per pts granddaughter: patient being treated with antibiotic by PCP; swelling and pain has improved   Please contact PCP for relevant records re: the above. Thank you!

## 2024-03-06 DIAGNOSIS — E039 Hypothyroidism, unspecified: Secondary | ICD-10-CM | POA: Diagnosis not present

## 2024-03-06 DIAGNOSIS — E1122 Type 2 diabetes mellitus with diabetic chronic kidney disease: Secondary | ICD-10-CM | POA: Diagnosis not present

## 2024-03-06 DIAGNOSIS — D509 Iron deficiency anemia, unspecified: Secondary | ICD-10-CM | POA: Diagnosis not present

## 2024-03-06 DIAGNOSIS — E782 Mixed hyperlipidemia: Secondary | ICD-10-CM | POA: Diagnosis not present

## 2024-03-07 ENCOUNTER — Ambulatory Visit: Admitting: Urology

## 2024-03-08 ENCOUNTER — Ambulatory Visit

## 2024-03-08 DIAGNOSIS — R339 Retention of urine, unspecified: Secondary | ICD-10-CM | POA: Diagnosis not present

## 2024-03-08 NOTE — Progress Notes (Unsigned)
 Cath Change/ Replacement  Patient is present today for a catheter change due to urinary retention.  10ml of water was removed from the balloon, a 16FR foley cath was removed without difficulty.  Patient was cleaned and prepped in a sterile fashion with Betadinex3.  A 16 FR foley cath was replaced into the bladder, no complications were noted. Urine return was noted 15ml and urine was Dark yellow in color. The balloon was filled with 10ml of sterile water. A leg bag was attached for drainage.  A leg bag was also given to the patient and patient was given instruction on how to change from one bag to another. Patient was given proper instruction on catheter care.    Performed by: Exie DASEN. CMA  Follow up: 4 weeks

## 2024-03-09 ENCOUNTER — Ambulatory Visit: Admitting: Urology

## 2024-03-09 ENCOUNTER — Encounter: Payer: Self-pay | Admitting: Urology

## 2024-03-09 DIAGNOSIS — N401 Enlarged prostate with lower urinary tract symptoms: Secondary | ICD-10-CM

## 2024-03-09 DIAGNOSIS — R338 Other retention of urine: Secondary | ICD-10-CM | POA: Diagnosis not present

## 2024-03-09 DIAGNOSIS — K5901 Slow transit constipation: Secondary | ICD-10-CM

## 2024-03-09 DIAGNOSIS — R339 Retention of urine, unspecified: Secondary | ICD-10-CM

## 2024-03-09 DIAGNOSIS — R1032 Left lower quadrant pain: Secondary | ICD-10-CM | POA: Diagnosis not present

## 2024-03-09 DIAGNOSIS — Z978 Presence of other specified devices: Secondary | ICD-10-CM | POA: Diagnosis not present

## 2024-03-09 NOTE — Progress Notes (Signed)
 Name: Jimmy Franklin DOB: [birth year] MRN: [id number]  History of Present Illness: Jimmy Franklin is a 89 y.o. male who presents today for follow up visit at [facility] . Relevant History includes: 1. BPH with incomplete bladder emptying. - 2014: TURP procedure.   2. Bladder neck contracture. - 2016: Incision of a bladder neck contracture. 3. Urinary retention.  - Possible neurogenic bladder secondary to prior stroke with resultant hemiplegia and former nicotine use.  - Exacerbating factors: chronic constipation, bladder neck contracture, BPH, ambulatory dysfunction.  - He has been catheter dependent for some time.    Today: He reports LLQ pain since this morning and some urine leakage around his catheter. He reports constipation - states that his last bowel movement was a few days ago. He denies gross hematuria, flank pain, fevers, nausea, or vomiting.   Medications: Current Outpatient Medications  Medication Sig Dispense Refill   atorvastatin (LIPITOR) 40 MG tablet Take 40 mg by mouth daily.     Cholecalciferol (VITAMIN D3) 50 MCG (2000 UT) capsule Take 2,000 Units by mouth daily.     clopidogrel  (PLAVIX ) 75 MG tablet Take 75 mg by mouth daily.     doxycycline  (VIBRAMYCIN ) 100 MG capsule Take 100 mg by mouth 2 (two) times daily.     ferrous sulfate 324 MG TBEC Take 324 mg by mouth daily with breakfast.     levothyroxine (SYNTHROID) 75 MCG tablet Take 75 mcg by mouth daily.     losartan (COZAAR) 25 MG tablet Take 25 mg by mouth daily.     pantoprazole (PROTONIX) 40 MG tablet TAKE 1 TABLET BY MOUTH ONCE DAILY. 90 tablet 2   phenytoin  (DILANTIN ) 100 MG ER capsule Take 200 mg by mouth 2 (two) times daily.      sertraline  (ZOLOFT ) 100 MG tablet      Vitamin D, Ergocalciferol, (DRISDOL) 1.25 MG (50000 UNIT) CAPS capsule Take 50,000 Units by mouth once a week.     No current facility-administered medications for this visit.    Allergies: No Known  Allergies  Past Medical History:  Diagnosis Date   Aortic stenosis    mild to moderate on echo 2023, LV EF >70   Bowel habit changes 03/21/2011   CAD (coronary artery disease)    CHF (congestive heart failure) (HCC)    Chronic constipation 2008   COPD (chronic obstructive pulmonary disease) (HCC)    Depression    Enlarged prostate    Foley catheter in place 03/17/2013   GERD (gastroesophageal reflux disease)    Hearing loss of both ears    Hepatitis    20 yrs ago, states he turned yellow    HTN (hypertension)    Hx of colonic polyps    Hyperlipidemia    Mixed hyperlipidemia    Polymyalgia rheumatica (HCC)    PVD (peripheral vascular disease) (HCC)    Seizures (HCC)    caused by UTI-none recent- over  2 yr   Stroke [facility])    CVA 2013, left sided weakness, mild residual, last cva 05/2022   Subarachnoid hemorrhage (HCC)    Unspecified essential hypertension    Past Surgical History:  Procedure Laterality Date   BIOPSY  07/08/2017   Procedure: BIOPSY;  Surgeon: Shaaron Lamar HERO, MD;  [facility];  Service: Endoscopy;;  gastric   CATARACT EXTRACTION, BILATERAL     CHOLECYSTECTOMY     lap. gallbladder removal   COLONOSCOPY  07/04/2007   COLONOSCOPY WITH PROPOFOL  N/A 11/17/2023  Procedure: COLONOSCOPY WITH PROPOFOL ;  Surgeon: Shaaron Lamar HERO, MD;  [facility];  Service: Endoscopy;  Laterality: N/A;  11:00 AM, ASA 3/4   CORONARY ANGIOPLASTY WITH STENT PLACEMENT  2016   ESOPHAGOGASTRODUODENOSCOPY (EGD) WITH PROPOFOL  N/A 07/08/2017   Procedure: ESOPHAGOGASTRODUODENOSCOPY (EGD) WITH PROPOFOL ;  Surgeon: Shaaron Lamar HERO, MD;  [facility];  Service: Endoscopy;  Laterality: N/A;  7:30am   ESOPHAGOGASTRODUODENOSCOPY (EGD) WITH PROPOFOL  N/A 11/17/2023   Procedure: ESOPHAGOGASTRODUODENOSCOPY (EGD) WITH PROPOFOL ;  Surgeon: Shaaron Lamar HERO, MD;  [facility];  Service: Endoscopy;  Laterality: N/A;  11:00 AM, ASA 3/4   MALONEY DILATION N/A  07/08/2017   Procedure: MALONEY DILATION;  Surgeon: Shaaron Lamar HERO, MD;  [facility];  Service: Endoscopy;  Laterality: N/A;   POLYPECTOMY  11/17/2023   Procedure: POLYPECTOMY;  Surgeon: Shaaron Lamar HERO, MD;  [facility];  Service: Endoscopy;;   TONSILLECTOMY     TRANSURETHRAL INCISION OF BLADDER NECK N/A 01/24/2015   Procedure: TRANSURETHRAL INCISION OF BLADDER NECK;  Surgeon: Garnette Shack, MD;  [facility];  Service: Urology;  Laterality: N/A;  **TRANSURETHRAL RESECTION BLADDER NECK CONTRACTURE**      TRANSURETHRAL RESECTION OF PROSTATE N/A 03/23/2013   Procedure: TRANSURETHRAL RESECTION OF THE PROSTATE (TURP);  Surgeon: Garnette Shack, MD;  [facility];  Service: Urology;  Laterality: N/A;   Family History  Problem Relation Age of Onset   Colon cancer Mother    Colon cancer Father    Social History   Socioeconomic History   Marital status: Married    Spouse name: Not on file   Number of children: Not on file   Years of education: Not on file   Highest education level: Not on file  Occupational History   Not on file  Tobacco Use   Smoking status: Former    Current packs/day: 0.00    Average packs/day: 3.0 packs/day for 15.0 years (45.0 ttl pk-yrs)    Types: Cigarettes    Start date: 03/17/1952    Quit date: 03/18/1967    Years since quitting: 57.0   Smokeless tobacco: Former  Building services engineer status: Never Used  Substance and Sexual Activity   Alcohol  use: No   Drug use: No   Sexual activity: Not Currently  Other Topics Concern   Not on file  Social History Narrative   Not on file   Social Drivers of Health   Financial Resource Strain: Low Risk  (05/29/2022)   Received from [facility]   Overall Financial Resource Strain (CARDIA)    Difficulty of Paying Living Expenses: Not very hard  Food Insecurity: No Food Insecurity (06/17/2022)   Received from [facility]   Hunger Vital Sign    Within the past 12 months, you  worried that your food would run out before you got the money to buy more.: Never true    Within the past 12 months, the food you bought just didn't last and you didn't have money to get more.: Never true  Transportation Needs: No Transportation Needs (05/31/2022)   Received from [facility] - Transportation    Lack of Transportation (Medical): No    Lack of Transportation (Non-Medical): No  Physical Activity: Not on file  Stress: Stress Concern Present (05/28/2022)   Received from [facility] - Occupational Stress Questionnaire    Feeling of Stress : To some extent  Social Connections:  Unknown (05/28/2022)   Received from [facility]   Social Network    Social Network: Not on file  Intimate Partner Violence: Unknown (05/28/2022)   Received from [facility]   HITS    Physically Hurt: Not on file    Insult or Talk Down To: Not on file    Threaten Physical Harm: Not on file    Scream or Curse: Not on file    Review of Systems Constitutional: Patient denies any unintentional weight loss or change in strength lntegumentary: Patient denies any rashes or pruritus Cardiovascular: Patient denies chest pain or syncope Respiratory: Patient denies shortness of breath Gastrointestinal: As per HPI Musculoskeletal: Patient denies muscle cramps or weakness Neurologic: Patient denies convulsions or seizures Allergic/Immunologic: Patient denies recent allergic reaction(s) Hematologic/Lymphatic: Patient denies bleeding tendencies Endocrine: Patient denies heat/cold intolerance  GU: As per HPI.  OBJECTIVE There were no vitals filed for this visit. There is no height or weight on file to calculate BMI.  Physical Examination Constitutional: No obvious distress; patient is non-toxic appearing  Cardiovascular: No visible lower extremity edema.  Respiratory: The patient does not have audible wheezing/stridor; respirations do not appear  labored  Gastrointestinal: Abdomen non-distended Musculoskeletal: Normal ROM of UEs  Skin: No obvious rashes/open sores  Neurologic: CN 2-12 grossly intact Psychiatric: Answered questions appropriately with normal affect  Hematologic/Lymphatic/Immunologic: No obvious bruises or sites of spontaneous bleeding  GU: Catheter draining clear yellow urine.   ASSESSMENT LLQ pain  Slow transit constipation  Urinary retention  Foley catheter in place  We discussed constipation as suspected cause for his LLQ pain. Advised OTC stool softeners and/or laxatives to induce bowel movement today. He was advised to go to the ER if He develops fever >100.5 F, uncontrollable pain, or other significantly concerning symptoms. Patient verbalized understanding of and agreement with current plan. All questions were answered.  PLAN Advised the following: 1. OTC stool softeners and/or laxatives as discussed. 2. Continue indwelling Foley catheter.  3. Monthly nurse visits for catheter exchange. 4. Follow up with Dr. Matilda (per pt request).   No orders of the defined types were placed in this encounter.   It has been explained that the patient is to follow regularly with their PCP in addition to all other providers involved in their care and to follow instructions provided by these respective offices. Patient advised to contact urology clinic if any urologic-pertaining questions, concerns, new symptoms or problems arise in the interim period.  There are no Patient Instructions on file for this visit.  Electronically signed by:  Lauraine JAYSON Oz, FNP   03/09/24    12:27 PM

## 2024-03-15 ENCOUNTER — Ambulatory Visit: Admitting: Urology

## 2024-03-15 DIAGNOSIS — Z6829 Body mass index (BMI) 29.0-29.9, adult: Secondary | ICD-10-CM | POA: Diagnosis not present

## 2024-03-15 DIAGNOSIS — K5900 Constipation, unspecified: Secondary | ICD-10-CM | POA: Diagnosis not present

## 2024-03-15 DIAGNOSIS — R1032 Left lower quadrant pain: Secondary | ICD-10-CM | POA: Diagnosis not present

## 2024-04-06 DIAGNOSIS — D509 Iron deficiency anemia, unspecified: Secondary | ICD-10-CM | POA: Diagnosis not present

## 2024-04-06 DIAGNOSIS — E782 Mixed hyperlipidemia: Secondary | ICD-10-CM | POA: Diagnosis not present

## 2024-04-06 DIAGNOSIS — E1122 Type 2 diabetes mellitus with diabetic chronic kidney disease: Secondary | ICD-10-CM | POA: Diagnosis not present

## 2024-04-06 DIAGNOSIS — E039 Hypothyroidism, unspecified: Secondary | ICD-10-CM | POA: Diagnosis not present

## 2024-04-10 NOTE — Progress Notes (Incomplete)
 History of Present Illness:   History of BPH with urinary retention. He has had several procedures for his bladder outlet/prostate, and despite these he still had poor emptying.  Due to poor emptying, until several months ago had a chronic indwelling Foley catheter.  This was discontinued in October.  Follow-up revealed a residual urine volume of only 117 mL.  12.19.2023: Here for recheck.  He has not had any dysuria or gross hematuria.  He feels that his stream is fairly good.  He has not been treated for urinary infection recently.    Past Medical History:  Diagnosis Date   Aortic stenosis    mild to moderate on echo 2023, LV EF >70   Bowel habit changes 03/21/2011   CAD (coronary artery disease)    CHF (congestive heart failure) (HCC)    Chronic constipation 2008   COPD (chronic obstructive pulmonary disease) (HCC)    Depression    Enlarged prostate    Foley catheter in place 03/17/2013   GERD (gastroesophageal reflux disease)    Hearing loss of both ears    Hepatitis    20 yrs ago, states he turned yellow    HTN (hypertension)    Hx of colonic polyps    Hyperlipidemia    Mixed hyperlipidemia    Polymyalgia rheumatica (HCC)    PVD (peripheral vascular disease) (HCC)    Seizures (HCC)    caused by UTI-none recent- over  2 yr   Stroke [facility])    CVA 2013, left sided weakness, mild residual, last cva 05/2022   Subarachnoid hemorrhage (HCC)    Unspecified essential hypertension     Past Surgical History:  Procedure Laterality Date   BIOPSY  07/08/2017   Procedure: BIOPSY;  Surgeon: Shaaron Lamar HERO, MD;  [facility];  Service: Endoscopy;;  gastric   CATARACT EXTRACTION, BILATERAL     CHOLECYSTECTOMY     lap. gallbladder removal   COLONOSCOPY  07/04/2007   COLONOSCOPY WITH PROPOFOL  N/A 11/17/2023   Procedure: COLONOSCOPY WITH PROPOFOL ;  Surgeon: Shaaron Lamar HERO, MD;  [facility];  Service: Endoscopy;  Laterality: N/A;  11:00 AM, ASA 3/4   CORONARY  ANGIOPLASTY WITH STENT PLACEMENT  2016   ESOPHAGOGASTRODUODENOSCOPY (EGD) WITH PROPOFOL  N/A 07/08/2017   Procedure: ESOPHAGOGASTRODUODENOSCOPY (EGD) WITH PROPOFOL ;  Surgeon: Shaaron Lamar HERO, MD;  [facility];  Service: Endoscopy;  Laterality: N/A;  7:30am   ESOPHAGOGASTRODUODENOSCOPY (EGD) WITH PROPOFOL  N/A 11/17/2023   Procedure: ESOPHAGOGASTRODUODENOSCOPY (EGD) WITH PROPOFOL ;  Surgeon: Shaaron Lamar HERO, MD;  [facility];  Service: Endoscopy;  Laterality: N/A;  11:00 AM, ASA 3/4   MALONEY DILATION N/A 07/08/2017   Procedure: MALONEY DILATION;  Surgeon: Shaaron Lamar HERO, MD;  [facility];  Service: Endoscopy;  Laterality: N/A;   POLYPECTOMY  11/17/2023   Procedure: POLYPECTOMY;  Surgeon: Shaaron Lamar HERO, MD;  [facility];  Service: Endoscopy;;   TONSILLECTOMY     TRANSURETHRAL INCISION OF BLADDER NECK N/A 01/24/2015   Procedure: TRANSURETHRAL INCISION OF BLADDER NECK;  Surgeon: Garnette Shack, MD;  [facility]: [facility];  Service: Urology;  Laterality: N/A;  **TRANSURETHRAL RESECTION BLADDER NECK CONTRACTURE**      TRANSURETHRAL RESECTION OF PROSTATE N/A 03/23/2013   Procedure: TRANSURETHRAL RESECTION OF THE PROSTATE (TURP);  Surgeon: Garnette Shack, MD;  Location: [facility];  Service: Urology;  Laterality: N/A;    Home Medications:  Allergies as of 04/11/2024   No Known Allergies  Medication List        Accurate as of April 10, 2024  7:19 PM. If you have any questions, ask your nurse or doctor.          atorvastatin 40 MG tablet Commonly known as: LIPITOR Take 40 mg by mouth daily.   clopidogrel  75 MG tablet Commonly known as: PLAVIX  Take 75 mg by mouth daily.   doxycycline  100 MG capsule Commonly known as: VIBRAMYCIN  Take 100 mg by mouth 2 (two) times daily.   ferrous sulfate 324 MG Tbec Take 324 mg by mouth daily with breakfast.   levothyroxine 75 MCG tablet Commonly known as: SYNTHROID Take 75 mcg by mouth daily.    losartan 25 MG tablet Commonly known as: COZAAR Take 25 mg by mouth daily.   pantoprazole 40 MG tablet Commonly known as: PROTONIX TAKE 1 TABLET BY MOUTH ONCE DAILY.   phenytoin  100 MG ER capsule Commonly known as: DILANTIN  Take 200 mg by mouth 2 (two) times daily.   sertraline  100 MG tablet Commonly known as: ZOLOFT    Vitamin D (Ergocalciferol) 1.25 MG (50000 UNIT) Caps capsule Commonly known as: DRISDOL Take 50,000 Units by mouth once a week.   Vitamin D3 50 MCG (2000 UT) capsule Take 2,000 Units by mouth daily.        Allergies: No Known Allergies  Family History  Problem Relation Age of Onset   Colon cancer Mother    Colon cancer Father     Social History:  reports that he quit smoking about 57 years ago. His smoking use included cigarettes. He started smoking about 72 years ago. He has a 45 pack-year smoking history. He has quit using smokeless tobacco. He reports that he does not drink alcohol  and does not use drugs.  ROS: A complete review of systems was performed.  All systems are negative except for pertinent findings as noted.  Physical Exam:  Vital signs in last 24 hours: There were no vitals taken for this visit. Constitutional:  Alert and oriented, No acute distress Cardiovascular: Regular rate  Respiratory: Normal respiratory effort GI: Abdomen is soft, nontender, nondistended, no abdominal masses. No CVAT.  Genitourinary: Normal male phallus, testes are descended bilaterally and non-tender and without masses, scrotum is normal in appearance without lesions or masses, perineum is normal on inspection. Lymphatic: No lymphadenopathy Neurologic: Grossly intact, no focal deficits Psychiatric: Normal mood and affect  I have reviewed prior pt notes  I have reviewed notes from referring/previous physicians-hospital results reviewed  I have reviewed urinalysis results--she does have pyuria  I have independently reviewed prior imaging  I have reviewed  bladder scan-volume approximately 118 mL I have reviewed prior urine culture   Impression/Assessment:  History of BPH, status post laser prostatectomy with resultant stricture and retention.  He had a long-term indwelling Foley catheter which has been removed now for a few months.  He seems to be draining pretty well.  Plan:  1.  I will culture her urine but only treat for an odd organism  2.  I will see her back in about 6 months for recheck with bladder scan

## 2024-04-11 ENCOUNTER — Ambulatory Visit: Admitting: Urology

## 2024-04-11 DIAGNOSIS — Z978 Presence of other specified devices: Secondary | ICD-10-CM

## 2024-04-11 DIAGNOSIS — N320 Bladder-neck obstruction: Secondary | ICD-10-CM

## 2024-04-11 DIAGNOSIS — R339 Retention of urine, unspecified: Secondary | ICD-10-CM

## 2024-04-12 ENCOUNTER — Ambulatory Visit

## 2024-04-24 ENCOUNTER — Ambulatory Visit

## 2024-04-25 ENCOUNTER — Ambulatory Visit: Admitting: Urology

## 2024-04-26 ENCOUNTER — Ambulatory Visit (INDEPENDENT_AMBULATORY_CARE_PROVIDER_SITE_OTHER)

## 2024-04-26 DIAGNOSIS — R339 Retention of urine, unspecified: Secondary | ICD-10-CM | POA: Diagnosis not present

## 2024-04-26 MED ORDER — CIPROFLOXACIN HCL 500 MG PO TABS
500.0000 mg | ORAL_TABLET | Freq: Once | ORAL | Status: AC
  Administered 2024-04-26: 500 mg via ORAL

## 2024-04-26 NOTE — Progress Notes (Signed)
 Cath Change/ Replacement  Patient is present today for a catheter change due to urinary retention.  10 ml of water was removed from the balloon, a 16 FR foley cath was removed without difficulty.  Patient was cleaned and prepped in a sterile fashion with Betadinex3.  A 16  FR foley cath was replaced into the bladder, no complications were noted. Urine return was noted 10 ml and urine was Dark yellow in color. The balloon was filled with 10ml of sterile water. A leg bag was attached for drainage.  A night bag was also given to the patient and patient was given instruction on how to change from one bag to another. Patient was given proper instruction on catheter care.    Performed by: Carlos, CMA  Follow up: Monthly cath change

## 2024-05-05 DIAGNOSIS — E1122 Type 2 diabetes mellitus with diabetic chronic kidney disease: Secondary | ICD-10-CM | POA: Diagnosis not present

## 2024-05-05 DIAGNOSIS — E039 Hypothyroidism, unspecified: Secondary | ICD-10-CM | POA: Diagnosis not present

## 2024-05-05 DIAGNOSIS — E782 Mixed hyperlipidemia: Secondary | ICD-10-CM | POA: Diagnosis not present

## 2024-05-05 DIAGNOSIS — D509 Iron deficiency anemia, unspecified: Secondary | ICD-10-CM | POA: Diagnosis not present

## 2024-05-15 DIAGNOSIS — Z1321 Encounter for screening for nutritional disorder: Secondary | ICD-10-CM | POA: Diagnosis not present

## 2024-05-15 DIAGNOSIS — E782 Mixed hyperlipidemia: Secondary | ICD-10-CM | POA: Diagnosis not present

## 2024-05-15 DIAGNOSIS — E1165 Type 2 diabetes mellitus with hyperglycemia: Secondary | ICD-10-CM | POA: Diagnosis not present

## 2024-05-15 DIAGNOSIS — Z0001 Encounter for general adult medical examination with abnormal findings: Secondary | ICD-10-CM | POA: Diagnosis not present

## 2024-05-15 DIAGNOSIS — E7849 Other hyperlipidemia: Secondary | ICD-10-CM | POA: Diagnosis not present

## 2024-05-15 DIAGNOSIS — E039 Hypothyroidism, unspecified: Secondary | ICD-10-CM | POA: Diagnosis not present

## 2024-05-15 DIAGNOSIS — E1122 Type 2 diabetes mellitus with diabetic chronic kidney disease: Secondary | ICD-10-CM | POA: Diagnosis not present

## 2024-05-15 DIAGNOSIS — Z6829 Body mass index (BMI) 29.0-29.9, adult: Secondary | ICD-10-CM | POA: Diagnosis not present

## 2024-05-15 DIAGNOSIS — E559 Vitamin D deficiency, unspecified: Secondary | ICD-10-CM | POA: Diagnosis not present

## 2024-05-15 DIAGNOSIS — D509 Iron deficiency anemia, unspecified: Secondary | ICD-10-CM | POA: Diagnosis not present

## 2024-05-15 DIAGNOSIS — Z23 Encounter for immunization: Secondary | ICD-10-CM | POA: Diagnosis not present

## 2024-05-17 ENCOUNTER — Telehealth: Payer: Self-pay

## 2024-05-17 NOTE — Telephone Encounter (Signed)
 Pt's grandson came by office to get a overnight bag. Pt cath bag is leaking.

## 2024-05-29 ENCOUNTER — Ambulatory Visit (INDEPENDENT_AMBULATORY_CARE_PROVIDER_SITE_OTHER)

## 2024-05-29 DIAGNOSIS — R339 Retention of urine, unspecified: Secondary | ICD-10-CM

## 2024-05-29 NOTE — Progress Notes (Signed)
 Cath Change/ Replacement  Patient is present today for a catheter change due to urinary retention.  10 ml of water was removed from the balloon, a 16 FR foley cath was removed without difficulty.  Patient was cleaned and prepped in a sterile fashion with Betadinex3.  A 16  FR foley cath was replaced into the bladder, no complications were noted. Urine return was noted 5 ml and urine was Clear yellow in color. The balloon was filled with 10ml of sterile water. A night bag was attached for drainage.  A night bag was also given to the patient and patient was given instruction on how to change from one bag to another. Patient was given proper instruction on catheter care.    Performed by: Carlos, CMA  Follow up: Keep monthly Cath Change

## 2024-06-06 DIAGNOSIS — E1122 Type 2 diabetes mellitus with diabetic chronic kidney disease: Secondary | ICD-10-CM | POA: Diagnosis not present

## 2024-06-06 DIAGNOSIS — D509 Iron deficiency anemia, unspecified: Secondary | ICD-10-CM | POA: Diagnosis not present

## 2024-06-06 DIAGNOSIS — E782 Mixed hyperlipidemia: Secondary | ICD-10-CM | POA: Diagnosis not present

## 2024-06-06 DIAGNOSIS — E039 Hypothyroidism, unspecified: Secondary | ICD-10-CM | POA: Diagnosis not present

## 2024-07-03 ENCOUNTER — Ambulatory Visit

## 2024-07-03 DIAGNOSIS — R339 Retention of urine, unspecified: Secondary | ICD-10-CM

## 2024-07-03 MED ORDER — CIPROFLOXACIN HCL 500 MG PO TABS: 500.0000 mg | ORAL_TABLET | Freq: Once | ORAL | Status: DC

## 2024-07-03 NOTE — Progress Notes (Cosign Needed Addendum)
 Cath Change/ Replacement  Patient is present today for a catheter change due to urinary retention.  10 ml of water was removed from the balloon, a 16 FR foley cath was removed without difficulty.  Patient was cleaned and prepped in a sterile fashion with Betadinex3.  A 16  coude per Dr. Sherrilee  FR foley cath was replaced into the bladder, no complications were noted. Urine return was noted 10 ml and urine was Clear yellow in color. The balloon was filled with 10ml of sterile water. A leg bag was attached for drainage.  A night bag was also given to the patient and patient was given instruction on how to change from one bag to another. Patient was given proper instruction on catheter care.    Performed by: Carlos, CMA  Follow up: 4 weeks Cath Change

## 2024-07-07 DIAGNOSIS — E1122 Type 2 diabetes mellitus with diabetic chronic kidney disease: Secondary | ICD-10-CM | POA: Diagnosis not present

## 2024-07-07 DIAGNOSIS — E039 Hypothyroidism, unspecified: Secondary | ICD-10-CM | POA: Diagnosis not present

## 2024-07-07 DIAGNOSIS — E782 Mixed hyperlipidemia: Secondary | ICD-10-CM | POA: Diagnosis not present

## 2024-07-07 DIAGNOSIS — D509 Iron deficiency anemia, unspecified: Secondary | ICD-10-CM | POA: Diagnosis not present

## 2024-07-24 NOTE — Progress Notes (Signed)
 Impression/Assessment:  History of BPH, status post TURP 2014/TUR BNC 2016.  Long term urinary retention.  I think we should leave the catheter in  Plan: -I think it is worthwhile having him learn how to unplug/plug the catheter.  We taught him how to do that today  - I will have him come back for nursing visits once a month for catheter change-I would like to see him at the nursing visit here in a month   History of Present Illness: Jimmy Franklin is here today for follow-up.  He underwent TURP in 2014.  That was utilized using laser.  He had incision of a bladder neck contracture in 2016.  Following that he had recurrence and inadequate bladder emptying.  He was catheter dependent for some time.  In late summer, 2023 he decided he did not want a catheter.  He was followed carefully thereafter and he had appropriate bladder emptying.  6.18.2024: Here today for recheck.  He empties a fair amount of urine into his diaper.  He does have a strong stream at times.  He does not have gross hematuria or dysuria.  Urine is intermittently cloudy.  He has not been treated for any infections over the past few months.  11.18.2025: Here today for routine check.  Not in the best medical condition.  Tolerates his catheter pretty well.  He would like to consider not having a catheter, however.  He failed his prior catheter removal.  Past Medical History:  Diagnosis Date   Aortic stenosis    mild to moderate on echo 2023, LV EF >70   Bowel habit changes 03/21/2011   CAD (coronary artery disease)    CHF (congestive heart failure) (HCC)    Chronic constipation 2008   COPD (chronic obstructive pulmonary disease) (HCC)    Depression    Enlarged prostate    Foley catheter in place 03/17/2013   GERD (gastroesophageal reflux disease)    Hearing loss of both ears    Hepatitis    20 yrs ago, states he turned yellow    HTN (hypertension)    Hx of colonic polyps    Hyperlipidemia    Mixed hyperlipidemia     Polymyalgia rheumatica    PVD (peripheral vascular disease)    Seizures (HCC)    caused by UTI-none recent- over  2 yr   Stroke [facility])    CVA 2013, left sided weakness, mild residual, last cva 05/2022   Subarachnoid hemorrhage (HCC)    Unspecified essential hypertension     Past Surgical History:  Procedure Laterality Date   BIOPSY  07/08/2017   Procedure: BIOPSY;  Surgeon: Shaaron Lamar HERO, MD;  [facility];  Service: Endoscopy;;  gastric   CATARACT EXTRACTION, BILATERAL     CHOLECYSTECTOMY     lap. gallbladder removal   COLONOSCOPY  07/04/2007   COLONOSCOPY WITH PROPOFOL  N/A 11/17/2023   Procedure: COLONOSCOPY WITH PROPOFOL ;  Surgeon: Shaaron Lamar HERO, MD;  [facility];  Service: Endoscopy;  Laterality: N/A;  11:00 AM, ASA 3/4   CORONARY ANGIOPLASTY WITH STENT PLACEMENT  2016   ESOPHAGOGASTRODUODENOSCOPY (EGD) WITH PROPOFOL  N/A 07/08/2017   Procedure: ESOPHAGOGASTRODUODENOSCOPY (EGD) WITH PROPOFOL ;  Surgeon: Shaaron Lamar HERO, MD;  [facility];  Service: Endoscopy;  Laterality: N/A;  7:30am   ESOPHAGOGASTRODUODENOSCOPY (EGD) WITH PROPOFOL  N/A 11/17/2023   Procedure: ESOPHAGOGASTRODUODENOSCOPY (EGD) WITH PROPOFOL ;  Surgeon: Shaaron Lamar HERO, MD;  [facility];  Service: Endoscopy;  Laterality:  N/A;  11:00 AM, ASA 3/4   MALONEY DILATION N/A 07/08/2017   Procedure: MALONEY DILATION;  Surgeon: Shaaron Lamar HERO, MD;  [facility];  Service: Endoscopy;  Laterality: N/A;   POLYPECTOMY  11/17/2023   Procedure: POLYPECTOMY;  Surgeon: Shaaron Lamar HERO, MD;  [facility];  Service: Endoscopy;;   TONSILLECTOMY     TRANSURETHRAL INCISION OF BLADDER NECK N/A 01/24/2015   Procedure: TRANSURETHRAL INCISION OF BLADDER NECK;  Surgeon: Garnette Shack, MD;  [facility];  Service: Urology;  Laterality: N/A;  **TRANSURETHRAL RESECTION BLADDER NECK CONTRACTURE**      TRANSURETHRAL RESECTION OF PROSTATE N/A 03/23/2013   Procedure:  TRANSURETHRAL RESECTION OF THE PROSTATE (TURP);  Surgeon: Garnette Shack, MD;  Location: [facility];  Service: Urology;  Laterality: N/A;    Home Medications:  Allergies as of 07/25/2024   No Known Allergies      Medication List        Accurate as of July 24, 2024  6:45 PM. If you have any questions, ask your nurse or doctor.          atorvastatin 40 MG tablet Commonly known as: LIPITOR Take 40 mg by mouth daily.   clopidogrel  75 MG tablet Commonly known as: PLAVIX  Take 75 mg by mouth daily.   doxycycline  100 MG capsule Commonly known as: VIBRAMYCIN  Take 100 mg by mouth 2 (two) times daily.   ferrous sulfate 324 MG Tbec Take 324 mg by mouth daily with breakfast.   levothyroxine 75 MCG tablet Commonly known as: SYNTHROID Take 75 mcg by mouth daily.   losartan 25 MG tablet Commonly known as: COZAAR Take 25 mg by mouth daily.   pantoprazole 40 MG tablet Commonly known as: PROTONIX TAKE 1 TABLET BY MOUTH ONCE DAILY.   phenytoin  100 MG ER capsule Commonly known as: DILANTIN  Take 200 mg by mouth 2 (two) times daily.   sertraline  100 MG tablet Commonly known as: ZOLOFT    Vitamin D (Ergocalciferol) 1.25 MG (50000 UNIT) Caps capsule Commonly known as: DRISDOL Take 50,000 Units by mouth once a week.   Vitamin D3 50 MCG (2000 UT) capsule Take 2,000 Units by mouth daily.        Allergies: No Known Allergies  Family History  Problem Relation Age of Onset   Colon cancer Mother    Colon cancer Father     Social History:  reports that he quit smoking about 57 years ago. His smoking use included cigarettes. He started smoking about 72 years ago. He has a 45 pack-year smoking history. He has quit using smokeless tobacco. He reports that he does not drink alcohol  and does not use drugs.  ROS: A complete review of systems was performed.  All systems are negative except for pertinent findings as noted.  Physical Exam:  Vital signs in last 24 hours: There  were no vitals taken for this visit. Constitutional:  Alert and oriented, No acute distress.  Disheveled appearing Cardiovascular: Regular rate  Respiratory: Normal respiratory effort Neurologic: Grossly intact, no focal deficits Psychiatric: Normal mood and affect  I have reviewed prior pt notes  I have independently reviewed prior imaging--CT from 2021.  Prostate is large.  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  I have reviewed prior urine culture--last positive culture was E. coli in August, 2021

## 2024-07-25 ENCOUNTER — Ambulatory Visit (INDEPENDENT_AMBULATORY_CARE_PROVIDER_SITE_OTHER): Admitting: Urology

## 2024-07-25 VITALS — BP 169/69 | HR 80

## 2024-07-25 DIAGNOSIS — N320 Bladder-neck obstruction: Secondary | ICD-10-CM

## 2024-07-25 DIAGNOSIS — N401 Enlarged prostate with lower urinary tract symptoms: Secondary | ICD-10-CM

## 2024-07-25 DIAGNOSIS — Z87438 Personal history of other diseases of male genital organs: Secondary | ICD-10-CM

## 2024-07-25 DIAGNOSIS — R339 Retention of urine, unspecified: Secondary | ICD-10-CM

## 2024-07-25 MED ORDER — CIPROFLOXACIN HCL 500 MG PO TABS
500.0000 mg | ORAL_TABLET | Freq: Once | ORAL | Status: AC
  Administered 2024-07-25: 500 mg via ORAL

## 2024-07-25 NOTE — Progress Notes (Deleted)
 Bladder Scan completed today due to reason of urinary retention   Patient {can/cannot:17900} void prior to the bladder scan. Bladder scan result: ***  Performed By: Exie T. CMA  Additional notes- Patient is scheduled to follow up with MD

## 2024-07-25 NOTE — Progress Notes (Signed)
 Cath Change/ Replacement  Patient is present today for a catheter change due to urinary retention.  10 ml of water was removed from the balloon, a 16 coude FR foley cath was removed without difficulty.  Patient was cleaned and prepped in a sterile fashion with Betadinex3.  A 16 FR foley cath was replaced into the bladder, no complications were noted. Urine return was noted 10 ml and urine was Dark yellow in color. The balloon was filled with 10ml of sterile water. A leg bag was attached for drainage.  A night bag was also given to the patient and patient was given instruction on how to change from one bag to another. Patient was given proper instruction on catheter care.    Performed by: Exie DASEN. CMA  Follow up: 4 weeks with catheter change

## 2024-08-01 ENCOUNTER — Ambulatory Visit

## 2024-08-04 DIAGNOSIS — E1122 Type 2 diabetes mellitus with diabetic chronic kidney disease: Secondary | ICD-10-CM | POA: Diagnosis not present

## 2024-08-04 DIAGNOSIS — E782 Mixed hyperlipidemia: Secondary | ICD-10-CM | POA: Diagnosis not present

## 2024-08-04 DIAGNOSIS — E039 Hypothyroidism, unspecified: Secondary | ICD-10-CM | POA: Diagnosis not present

## 2024-08-04 DIAGNOSIS — D509 Iron deficiency anemia, unspecified: Secondary | ICD-10-CM | POA: Diagnosis not present

## 2024-08-25 ENCOUNTER — Ambulatory Visit

## 2024-08-25 ENCOUNTER — Telehealth: Payer: Self-pay

## 2024-08-25 DIAGNOSIS — R339 Retention of urine, unspecified: Secondary | ICD-10-CM | POA: Diagnosis not present

## 2024-08-25 MED ORDER — CIPROFLOXACIN HCL 500 MG PO TABS: 500.0000 mg | ORAL_TABLET | Freq: Once | ORAL | Status: AC

## 2024-08-25 NOTE — Telephone Encounter (Signed)
 Patient came in today for cath change, appointment noted pt was to be scheduled on [place] day for MD to see pt. Pt state's he thought he was to have cath removed. Pt 's catheter changed today and 1 month cath change. Pt is aware a message will be sent to MD on advisement.

## 2024-08-25 NOTE — Progress Notes (Cosign Needed)
 Cath Change/ Replacement  Patient is present today for a catheter change due to urinary retention.  10 ml of water was removed from the balloon, a 16 FR foley cath was removed without difficulty.  Patient was cleaned and prepped in a sterile fashion with Betadinex3.  A 16  FR foley cath was replaced into the bladder, no complications were noted. Urine return was noted 25 ml and urine was Dark yellow in color. The balloon was filled with 10ml of sterile water. A no bag was attached for drainage per patient request. Cath was capped  A night bag was also given to the patient and patient was given instruction on how to change from one bag to another. Patient was given proper instruction on catheter care.    Performed by: Carlos, CMA  Follow up: 4 weeks Cath change

## 2024-09-07 ENCOUNTER — Encounter: Payer: Self-pay | Admitting: Gastroenterology

## 2024-09-11 ENCOUNTER — Telehealth: Payer: Self-pay

## 2024-09-11 ENCOUNTER — Ambulatory Visit

## 2024-09-11 DIAGNOSIS — R339 Retention of urine, unspecified: Secondary | ICD-10-CM | POA: Diagnosis not present

## 2024-09-11 DIAGNOSIS — R32 Unspecified urinary incontinence: Secondary | ICD-10-CM

## 2024-09-11 MED ORDER — SOLIFENACIN SUCCINATE 10 MG PO TABS: ORAL_TABLET | ORAL | 11 refills | Status: AC

## 2024-09-11 NOTE — Telephone Encounter (Signed)
 Patient came in today because he thought his cath was coming out. I look at pt cath and it was still intact. Pt state's he is voiding around the cath. Patient was explain this is bladder spasm and I would send a message to the MD on how to proceed next. Voice understanding

## 2024-09-11 NOTE — Telephone Encounter (Signed)
 Pt's granddaughter was made aware and voiced understanding Tell patient I sent in an antibiotic for him to take once daily to help with leakage.

## 2024-09-11 NOTE — Progress Notes (Signed)
 Patient came in today because he thought his cath was coming out. I look at pt cath and it was still intact. Pt state's he is voiding around the cath. Patient was explain this is bladder spasm and I would send a message to the MD on how to proceed next.

## 2024-09-25 ENCOUNTER — Ambulatory Visit

## 2024-09-25 DIAGNOSIS — R339 Retention of urine, unspecified: Secondary | ICD-10-CM

## 2024-09-25 MED ORDER — CIPROFLOXACIN HCL 500 MG PO TABS
500.0000 mg | ORAL_TABLET | Freq: Once | ORAL | Status: AC
  Administered 2024-09-25: 500 mg via ORAL

## 2024-09-25 NOTE — Progress Notes (Signed)
 Cath Change/ Replacement  Patient is present today for a catheter change due to urinary retention.  10 ml of water was removed from the balloon, a 16 FR coude foley cath was removed without difficulty.  Patient was cleaned and prepped in a sterile fashion with Betadinex3.  A 16  FR coude foley cath was replaced into the bladder, no complications were noted. Urine return was noted 65 ml and urine was Dark yellow in color. The balloon was filled with 10ml of sterile water. Cap  was attached.  A night bag was also given to the patient and patient was given instruction on how to change from one bag to another. Patient was given proper instruction on catheter care.    Performed by: Exie DASEN. CMA  Follow up: 4 weeks

## 2024-10-13 ENCOUNTER — Encounter (INDEPENDENT_AMBULATORY_CARE_PROVIDER_SITE_OTHER): Payer: Self-pay | Admitting: *Deleted

## 2024-10-27 ENCOUNTER — Ambulatory Visit
